# Patient Record
Sex: Female | Born: 1980 | Race: White | Hispanic: No | Marital: Single | Smoking: Current every day smoker
Health system: Southern US, Community
[De-identification: ages and names within clinical notes are randomized; demographics above are authoritative.]

## PROBLEM LIST (undated history)

## (undated) VITALS — BP 116/81 | HR 113 | Temp 97.3°F | Resp 17

## (undated) DIAGNOSIS — R51 Headache: Secondary | ICD-10-CM

## (undated) DIAGNOSIS — F329 Major depressive disorder, single episode, unspecified: Secondary | ICD-10-CM

## (undated) DIAGNOSIS — K219 Gastro-esophageal reflux disease without esophagitis: Secondary | ICD-10-CM

## (undated) DIAGNOSIS — K589 Irritable bowel syndrome without diarrhea: Secondary | ICD-10-CM

## (undated) DIAGNOSIS — M419 Scoliosis, unspecified: Secondary | ICD-10-CM

## (undated) DIAGNOSIS — R87619 Unspecified abnormal cytological findings in specimens from cervix uteri: Secondary | ICD-10-CM

## (undated) DIAGNOSIS — R160 Hepatomegaly, not elsewhere classified: Secondary | ICD-10-CM

## (undated) DIAGNOSIS — F419 Anxiety disorder, unspecified: Secondary | ICD-10-CM

## (undated) DIAGNOSIS — F32A Depression, unspecified: Secondary | ICD-10-CM

## (undated) DIAGNOSIS — IMO0002 Reserved for concepts with insufficient information to code with codable children: Secondary | ICD-10-CM

## (undated) HISTORY — PX: DILATION AND CURETTAGE OF UTERUS: SHX78

## (undated) HISTORY — PX: ENDOMETRIAL ABLATION: SHX621

## (undated) HISTORY — PX: TUBAL LIGATION: SHX77

---

## 1998-01-28 ENCOUNTER — Emergency Department (HOSPITAL_COMMUNITY): Admission: EM | Admit: 1998-01-28 | Discharge: 1998-01-28 | Payer: Self-pay | Admitting: Emergency Medicine

## 1998-11-12 ENCOUNTER — Emergency Department (HOSPITAL_COMMUNITY): Admission: EM | Admit: 1998-11-12 | Discharge: 1998-11-12 | Payer: Self-pay | Admitting: Internal Medicine

## 1998-12-30 ENCOUNTER — Inpatient Hospital Stay (HOSPITAL_COMMUNITY): Admission: EM | Admit: 1998-12-30 | Discharge: 1999-01-01 | Payer: Self-pay | Admitting: Emergency Medicine

## 1999-05-18 ENCOUNTER — Inpatient Hospital Stay (HOSPITAL_COMMUNITY): Admission: AD | Admit: 1999-05-18 | Discharge: 1999-05-18 | Payer: Self-pay | Admitting: Obstetrics

## 1999-06-04 ENCOUNTER — Encounter: Admission: RE | Admit: 1999-06-04 | Discharge: 1999-06-04 | Payer: Self-pay | Admitting: Family Medicine

## 1999-06-18 ENCOUNTER — Other Ambulatory Visit: Admission: RE | Admit: 1999-06-18 | Discharge: 1999-06-18 | Payer: Self-pay | Admitting: Family Medicine

## 1999-06-18 ENCOUNTER — Encounter: Admission: RE | Admit: 1999-06-18 | Discharge: 1999-06-18 | Payer: Self-pay | Admitting: Family Medicine

## 1999-06-20 ENCOUNTER — Ambulatory Visit (HOSPITAL_COMMUNITY): Admission: RE | Admit: 1999-06-20 | Discharge: 1999-06-20 | Payer: Self-pay | Admitting: Internal Medicine

## 1999-07-10 ENCOUNTER — Encounter: Admission: RE | Admit: 1999-07-10 | Discharge: 1999-07-10 | Payer: Self-pay | Admitting: Family Medicine

## 1999-07-17 ENCOUNTER — Encounter: Admission: RE | Admit: 1999-07-17 | Discharge: 1999-07-17 | Payer: Self-pay | Admitting: Family Medicine

## 1999-07-25 ENCOUNTER — Encounter: Admission: RE | Admit: 1999-07-25 | Discharge: 1999-07-25 | Payer: Self-pay | Admitting: Family Medicine

## 1999-08-16 ENCOUNTER — Ambulatory Visit (HOSPITAL_COMMUNITY): Admission: RE | Admit: 1999-08-16 | Discharge: 1999-08-16 | Payer: Self-pay | Admitting: *Deleted

## 1999-09-06 ENCOUNTER — Encounter: Admission: RE | Admit: 1999-09-06 | Discharge: 1999-09-06 | Payer: Self-pay | Admitting: Family Medicine

## 1999-09-13 ENCOUNTER — Encounter: Admission: RE | Admit: 1999-09-13 | Discharge: 1999-09-13 | Payer: Self-pay | Admitting: Family Medicine

## 1999-09-20 ENCOUNTER — Encounter: Admission: RE | Admit: 1999-09-20 | Discharge: 1999-09-20 | Payer: Self-pay | Admitting: Family Medicine

## 1999-09-23 ENCOUNTER — Encounter: Admission: RE | Admit: 1999-09-23 | Discharge: 1999-09-23 | Payer: Self-pay | Admitting: Family Medicine

## 1999-10-02 ENCOUNTER — Encounter: Admission: RE | Admit: 1999-10-02 | Discharge: 1999-10-02 | Payer: Self-pay | Admitting: Family Medicine

## 1999-10-15 ENCOUNTER — Inpatient Hospital Stay (HOSPITAL_COMMUNITY): Admission: AD | Admit: 1999-10-15 | Discharge: 1999-10-17 | Payer: Self-pay | Admitting: *Deleted

## 1999-10-15 ENCOUNTER — Encounter: Payer: Self-pay | Admitting: *Deleted

## 1999-10-20 ENCOUNTER — Emergency Department (HOSPITAL_COMMUNITY): Admission: EM | Admit: 1999-10-20 | Discharge: 1999-10-20 | Payer: Self-pay | Admitting: Emergency Medicine

## 1999-10-25 ENCOUNTER — Encounter (HOSPITAL_COMMUNITY): Admission: RE | Admit: 1999-10-25 | Discharge: 1999-11-04 | Payer: Self-pay | Admitting: *Deleted

## 1999-10-29 ENCOUNTER — Encounter: Admission: RE | Admit: 1999-10-29 | Discharge: 1999-10-29 | Payer: Self-pay | Admitting: Family Medicine

## 1999-10-29 ENCOUNTER — Other Ambulatory Visit: Admission: RE | Admit: 1999-10-29 | Discharge: 1999-10-29 | Payer: Self-pay | Admitting: Family Medicine

## 1999-11-02 ENCOUNTER — Inpatient Hospital Stay (HOSPITAL_COMMUNITY): Admission: AD | Admit: 1999-11-02 | Discharge: 1999-11-04 | Payer: Self-pay | Admitting: Obstetrics

## 1999-12-01 ENCOUNTER — Inpatient Hospital Stay (HOSPITAL_COMMUNITY): Admission: AD | Admit: 1999-12-01 | Discharge: 1999-12-01 | Payer: Self-pay | Admitting: Obstetrics & Gynecology

## 1999-12-02 ENCOUNTER — Inpatient Hospital Stay (HOSPITAL_COMMUNITY): Admission: AD | Admit: 1999-12-02 | Discharge: 1999-12-04 | Payer: Self-pay | Admitting: Obstetrics

## 1999-12-02 ENCOUNTER — Encounter (INDEPENDENT_AMBULATORY_CARE_PROVIDER_SITE_OTHER): Payer: Self-pay | Admitting: Specialist

## 1999-12-02 ENCOUNTER — Ambulatory Visit (HOSPITAL_COMMUNITY): Admission: EM | Admit: 1999-12-02 | Discharge: 1999-12-02 | Payer: Self-pay | Admitting: Obstetrics & Gynecology

## 1999-12-12 ENCOUNTER — Encounter: Admission: RE | Admit: 1999-12-12 | Discharge: 1999-12-12 | Payer: Self-pay | Admitting: Obstetrics

## 2000-02-10 ENCOUNTER — Emergency Department (HOSPITAL_COMMUNITY): Admission: EM | Admit: 2000-02-10 | Discharge: 2000-02-10 | Payer: Self-pay | Admitting: Emergency Medicine

## 2000-02-13 ENCOUNTER — Encounter: Admission: RE | Admit: 2000-02-13 | Discharge: 2000-02-13 | Payer: Self-pay | Admitting: Family Medicine

## 2000-02-26 ENCOUNTER — Emergency Department (HOSPITAL_COMMUNITY): Admission: EM | Admit: 2000-02-26 | Discharge: 2000-02-26 | Payer: Self-pay | Admitting: Emergency Medicine

## 2000-02-26 ENCOUNTER — Encounter: Payer: Self-pay | Admitting: Emergency Medicine

## 2000-03-18 ENCOUNTER — Encounter: Admission: RE | Admit: 2000-03-18 | Discharge: 2000-03-18 | Payer: Self-pay | Admitting: Family Medicine

## 2000-03-31 ENCOUNTER — Other Ambulatory Visit: Admission: RE | Admit: 2000-03-31 | Discharge: 2000-03-31 | Payer: Self-pay | Admitting: Obstetrics

## 2000-03-31 ENCOUNTER — Other Ambulatory Visit: Admission: RE | Admit: 2000-03-31 | Discharge: 2000-03-31 | Payer: Self-pay | Admitting: Family Medicine

## 2000-04-16 ENCOUNTER — Encounter: Admission: RE | Admit: 2000-04-16 | Discharge: 2000-04-16 | Payer: Self-pay | Admitting: Family Medicine

## 2000-09-02 ENCOUNTER — Emergency Department (HOSPITAL_COMMUNITY): Admission: EM | Admit: 2000-09-02 | Discharge: 2000-09-02 | Payer: Self-pay | Admitting: Emergency Medicine

## 2001-03-19 ENCOUNTER — Inpatient Hospital Stay (HOSPITAL_COMMUNITY): Admission: AD | Admit: 2001-03-19 | Discharge: 2001-03-19 | Payer: Self-pay | Admitting: Obstetrics & Gynecology

## 2002-02-04 ENCOUNTER — Encounter: Admission: RE | Admit: 2002-02-04 | Discharge: 2002-02-04 | Payer: Self-pay | Admitting: Family Medicine

## 2002-02-04 ENCOUNTER — Observation Stay (HOSPITAL_COMMUNITY): Admission: AD | Admit: 2002-02-04 | Discharge: 2002-02-05 | Payer: Self-pay | Admitting: Obstetrics and Gynecology

## 2002-02-08 ENCOUNTER — Encounter (HOSPITAL_COMMUNITY): Admission: RE | Admit: 2002-02-08 | Discharge: 2002-03-10 | Payer: Self-pay | Admitting: *Deleted

## 2002-02-18 ENCOUNTER — Inpatient Hospital Stay (HOSPITAL_COMMUNITY): Admission: AD | Admit: 2002-02-18 | Discharge: 2002-02-18 | Payer: Self-pay | Admitting: *Deleted

## 2002-02-19 ENCOUNTER — Inpatient Hospital Stay (HOSPITAL_COMMUNITY): Admission: AD | Admit: 2002-02-19 | Discharge: 2002-02-19 | Payer: Self-pay | Admitting: *Deleted

## 2002-03-07 ENCOUNTER — Encounter: Admission: RE | Admit: 2002-03-07 | Discharge: 2002-03-07 | Payer: Self-pay | Admitting: Family Medicine

## 2002-03-17 ENCOUNTER — Encounter: Admission: RE | Admit: 2002-03-17 | Discharge: 2002-03-17 | Payer: Self-pay | Admitting: Family Medicine

## 2002-03-25 ENCOUNTER — Encounter: Admission: RE | Admit: 2002-03-25 | Discharge: 2002-03-25 | Payer: Self-pay | Admitting: Family Medicine

## 2002-04-04 ENCOUNTER — Inpatient Hospital Stay (HOSPITAL_COMMUNITY): Admission: AD | Admit: 2002-04-04 | Discharge: 2002-04-06 | Payer: Self-pay | Admitting: Obstetrics and Gynecology

## 2002-05-19 ENCOUNTER — Encounter: Admission: RE | Admit: 2002-05-19 | Discharge: 2002-05-19 | Payer: Self-pay | Admitting: Family Medicine

## 2002-05-19 ENCOUNTER — Other Ambulatory Visit: Admission: RE | Admit: 2002-05-19 | Discharge: 2002-05-19 | Payer: Self-pay | Admitting: Family Medicine

## 2003-06-26 ENCOUNTER — Inpatient Hospital Stay (HOSPITAL_COMMUNITY): Admission: AD | Admit: 2003-06-26 | Discharge: 2003-06-26 | Payer: Self-pay | Admitting: Obstetrics & Gynecology

## 2003-09-14 ENCOUNTER — Emergency Department (HOSPITAL_COMMUNITY): Admission: EM | Admit: 2003-09-14 | Discharge: 2003-09-14 | Payer: Self-pay | Admitting: Emergency Medicine

## 2003-09-21 ENCOUNTER — Emergency Department (HOSPITAL_COMMUNITY): Admission: EM | Admit: 2003-09-21 | Discharge: 2003-09-21 | Payer: Self-pay | Admitting: Emergency Medicine

## 2004-05-03 ENCOUNTER — Inpatient Hospital Stay (HOSPITAL_COMMUNITY): Admission: AD | Admit: 2004-05-03 | Discharge: 2004-05-04 | Payer: Self-pay | Admitting: *Deleted

## 2004-08-11 ENCOUNTER — Inpatient Hospital Stay (HOSPITAL_COMMUNITY): Admission: AD | Admit: 2004-08-11 | Discharge: 2004-08-12 | Payer: Self-pay | Admitting: Obstetrics & Gynecology

## 2004-08-13 ENCOUNTER — Inpatient Hospital Stay (HOSPITAL_COMMUNITY): Admission: AD | Admit: 2004-08-13 | Discharge: 2004-08-13 | Payer: Self-pay | Admitting: *Deleted

## 2004-09-16 ENCOUNTER — Emergency Department (HOSPITAL_COMMUNITY): Admission: EM | Admit: 2004-09-16 | Discharge: 2004-09-16 | Payer: Self-pay | Admitting: Emergency Medicine

## 2004-11-10 ENCOUNTER — Inpatient Hospital Stay (HOSPITAL_COMMUNITY): Admission: AD | Admit: 2004-11-10 | Discharge: 2004-11-10 | Payer: Self-pay | Admitting: Obstetrics

## 2005-07-02 ENCOUNTER — Inpatient Hospital Stay (HOSPITAL_COMMUNITY): Admission: AD | Admit: 2005-07-02 | Discharge: 2005-07-02 | Payer: Self-pay | Admitting: Obstetrics and Gynecology

## 2005-09-08 ENCOUNTER — Encounter (INDEPENDENT_AMBULATORY_CARE_PROVIDER_SITE_OTHER): Payer: Self-pay | Admitting: *Deleted

## 2005-09-08 LAB — CONVERTED CEMR LAB

## 2005-09-18 ENCOUNTER — Ambulatory Visit: Payer: Self-pay | Admitting: Family Medicine

## 2005-09-25 ENCOUNTER — Encounter (INDEPENDENT_AMBULATORY_CARE_PROVIDER_SITE_OTHER): Payer: Self-pay | Admitting: *Deleted

## 2005-09-25 ENCOUNTER — Other Ambulatory Visit: Admission: RE | Admit: 2005-09-25 | Discharge: 2005-09-25 | Payer: Self-pay | Admitting: Family Medicine

## 2005-09-25 ENCOUNTER — Ambulatory Visit: Payer: Self-pay | Admitting: Family Medicine

## 2005-10-02 ENCOUNTER — Ambulatory Visit (HOSPITAL_COMMUNITY): Admission: RE | Admit: 2005-10-02 | Discharge: 2005-10-02 | Payer: Self-pay | Admitting: Obstetrics and Gynecology

## 2005-11-04 ENCOUNTER — Ambulatory Visit: Payer: Self-pay | Admitting: Family Medicine

## 2005-12-04 ENCOUNTER — Ambulatory Visit: Payer: Self-pay | Admitting: Family Medicine

## 2006-01-01 ENCOUNTER — Ambulatory Visit: Payer: Self-pay | Admitting: Family Medicine

## 2006-01-01 ENCOUNTER — Ambulatory Visit: Payer: Self-pay | Admitting: Certified Nurse Midwife

## 2006-01-01 ENCOUNTER — Inpatient Hospital Stay (HOSPITAL_COMMUNITY): Admission: AD | Admit: 2006-01-01 | Discharge: 2006-01-02 | Payer: Self-pay | Admitting: *Deleted

## 2006-01-14 ENCOUNTER — Ambulatory Visit: Payer: Self-pay | Admitting: Sports Medicine

## 2006-02-11 ENCOUNTER — Ambulatory Visit: Payer: Self-pay | Admitting: Family Medicine

## 2006-02-17 ENCOUNTER — Inpatient Hospital Stay (HOSPITAL_COMMUNITY): Admission: AD | Admit: 2006-02-17 | Discharge: 2006-02-19 | Payer: Self-pay | Admitting: Obstetrics and Gynecology

## 2006-02-17 ENCOUNTER — Ambulatory Visit: Payer: Self-pay | Admitting: Obstetrics and Gynecology

## 2006-03-31 ENCOUNTER — Ambulatory Visit: Payer: Self-pay | Admitting: Family Medicine

## 2006-05-07 ENCOUNTER — Ambulatory Visit: Payer: Self-pay | Admitting: Family Medicine

## 2006-05-14 ENCOUNTER — Ambulatory Visit: Payer: Self-pay | Admitting: Family Medicine

## 2006-09-08 HISTORY — PX: BREAST ENHANCEMENT SURGERY: SHX7

## 2006-11-04 ENCOUNTER — Ambulatory Visit: Payer: Self-pay | Admitting: Family Medicine

## 2006-11-04 ENCOUNTER — Inpatient Hospital Stay (HOSPITAL_COMMUNITY): Admission: RE | Admit: 2006-11-04 | Discharge: 2006-11-04 | Payer: Self-pay | Admitting: Family Medicine

## 2006-11-04 ENCOUNTER — Encounter (INDEPENDENT_AMBULATORY_CARE_PROVIDER_SITE_OTHER): Payer: Self-pay | Admitting: Family Medicine

## 2006-11-04 LAB — CONVERTED CEMR LAB
Chlamydia, DNA Probe: NEGATIVE
GC Probe Amp, Genital: NEGATIVE
hCG, Beta Chain, Quant, S: 214.5 milliintl units/mL

## 2006-11-05 DIAGNOSIS — F329 Major depressive disorder, single episode, unspecified: Secondary | ICD-10-CM

## 2006-11-05 DIAGNOSIS — G2589 Other specified extrapyramidal and movement disorders: Secondary | ICD-10-CM

## 2006-11-05 DIAGNOSIS — R8789 Other abnormal findings in specimens from female genital organs: Secondary | ICD-10-CM

## 2006-11-06 ENCOUNTER — Encounter (INDEPENDENT_AMBULATORY_CARE_PROVIDER_SITE_OTHER): Payer: Self-pay | Admitting: *Deleted

## 2006-11-06 ENCOUNTER — Inpatient Hospital Stay (HOSPITAL_COMMUNITY): Admission: AD | Admit: 2006-11-06 | Discharge: 2006-11-06 | Payer: Self-pay | Admitting: Obstetrics and Gynecology

## 2007-01-22 ENCOUNTER — Telehealth: Payer: Self-pay | Admitting: Family Medicine

## 2007-01-25 ENCOUNTER — Emergency Department (HOSPITAL_COMMUNITY): Admission: EM | Admit: 2007-01-25 | Discharge: 2007-01-25 | Payer: Self-pay | Admitting: Family Medicine

## 2007-01-25 ENCOUNTER — Telehealth: Payer: Self-pay | Admitting: *Deleted

## 2007-01-25 ENCOUNTER — Telehealth (INDEPENDENT_AMBULATORY_CARE_PROVIDER_SITE_OTHER): Payer: Self-pay | Admitting: *Deleted

## 2007-01-26 ENCOUNTER — Encounter: Payer: Self-pay | Admitting: Family Medicine

## 2007-01-26 ENCOUNTER — Ambulatory Visit: Payer: Self-pay | Admitting: Family Medicine

## 2007-01-26 DIAGNOSIS — N912 Amenorrhea, unspecified: Secondary | ICD-10-CM

## 2007-01-27 ENCOUNTER — Telehealth: Payer: Self-pay | Admitting: *Deleted

## 2007-02-08 ENCOUNTER — Ambulatory Visit: Payer: Self-pay | Admitting: Family Medicine

## 2007-02-08 LAB — CONVERTED CEMR LAB: Beta hcg, urine, semiquantitative: NEGATIVE

## 2007-02-24 ENCOUNTER — Ambulatory Visit: Payer: Self-pay | Admitting: Family Medicine

## 2007-02-24 LAB — CONVERTED CEMR LAB: Beta hcg, urine, semiquantitative: NEGATIVE

## 2007-06-01 ENCOUNTER — Ambulatory Visit: Payer: Self-pay | Admitting: Family Medicine

## 2007-06-01 LAB — CONVERTED CEMR LAB: Beta hcg, urine, semiquantitative: NEGATIVE

## 2007-06-21 ENCOUNTER — Emergency Department (HOSPITAL_COMMUNITY): Admission: EM | Admit: 2007-06-21 | Discharge: 2007-06-21 | Payer: Self-pay | Admitting: Family Medicine

## 2007-09-15 ENCOUNTER — Telehealth: Payer: Self-pay | Admitting: *Deleted

## 2007-09-17 ENCOUNTER — Telehealth: Payer: Self-pay | Admitting: *Deleted

## 2007-09-20 ENCOUNTER — Encounter: Payer: Self-pay | Admitting: Family Medicine

## 2007-09-20 ENCOUNTER — Ambulatory Visit: Payer: Self-pay | Admitting: Sports Medicine

## 2007-09-20 ENCOUNTER — Telehealth: Payer: Self-pay | Admitting: *Deleted

## 2007-10-22 ENCOUNTER — Encounter: Payer: Self-pay | Admitting: *Deleted

## 2007-11-29 ENCOUNTER — Telehealth: Payer: Self-pay | Admitting: *Deleted

## 2007-11-29 ENCOUNTER — Inpatient Hospital Stay (HOSPITAL_COMMUNITY): Admission: AD | Admit: 2007-11-29 | Discharge: 2007-11-29 | Payer: Self-pay | Admitting: Obstetrics and Gynecology

## 2007-11-30 ENCOUNTER — Telehealth (INDEPENDENT_AMBULATORY_CARE_PROVIDER_SITE_OTHER): Payer: Self-pay | Admitting: *Deleted

## 2007-12-09 ENCOUNTER — Encounter (INDEPENDENT_AMBULATORY_CARE_PROVIDER_SITE_OTHER): Payer: Self-pay | Admitting: *Deleted

## 2007-12-09 ENCOUNTER — Ambulatory Visit: Payer: Self-pay | Admitting: Family Medicine

## 2007-12-09 LAB — CONVERTED CEMR LAB
Chlamydia, DNA Probe: NEGATIVE
GC Probe Amp, Genital: NEGATIVE
Whiff Test: NEGATIVE

## 2007-12-10 ENCOUNTER — Encounter (INDEPENDENT_AMBULATORY_CARE_PROVIDER_SITE_OTHER): Payer: Self-pay | Admitting: *Deleted

## 2007-12-10 ENCOUNTER — Telehealth: Payer: Self-pay | Admitting: Psychology

## 2007-12-10 ENCOUNTER — Telehealth (INDEPENDENT_AMBULATORY_CARE_PROVIDER_SITE_OTHER): Payer: Self-pay | Admitting: *Deleted

## 2007-12-10 LAB — CONVERTED CEMR LAB: Pap Smear: NORMAL

## 2007-12-14 ENCOUNTER — Ambulatory Visit: Payer: Self-pay | Admitting: Family Medicine

## 2007-12-14 ENCOUNTER — Ambulatory Visit (HOSPITAL_COMMUNITY): Admission: RE | Admit: 2007-12-14 | Discharge: 2007-12-14 | Payer: Self-pay | Admitting: Family Medicine

## 2007-12-14 ENCOUNTER — Encounter: Payer: Self-pay | Admitting: Family Medicine

## 2007-12-14 LAB — CONVERTED CEMR LAB
HCT: 40.2 % (ref 36.0–46.0)
Hemoglobin: 13.3 g/dL (ref 12.0–15.0)
MCHC: 33.1 g/dL (ref 30.0–36.0)
MCV: 95.3 fL (ref 78.0–100.0)
Platelets: 229 10*3/uL (ref 150–400)
RBC: 4.22 M/uL (ref 3.87–5.11)
RDW: 12.6 % (ref 11.5–15.5)
TSH: 1.029 microintl units/mL (ref 0.350–5.50)
WBC: 8.8 10*3/uL (ref 4.0–10.5)

## 2007-12-18 ENCOUNTER — Encounter (INDEPENDENT_AMBULATORY_CARE_PROVIDER_SITE_OTHER): Payer: Self-pay | Admitting: *Deleted

## 2007-12-22 ENCOUNTER — Encounter: Payer: Self-pay | Admitting: Family Medicine

## 2007-12-23 ENCOUNTER — Telehealth: Payer: Self-pay | Admitting: Psychology

## 2009-05-26 ENCOUNTER — Encounter (INDEPENDENT_AMBULATORY_CARE_PROVIDER_SITE_OTHER): Payer: Self-pay | Admitting: *Deleted

## 2009-05-26 DIAGNOSIS — F17200 Nicotine dependence, unspecified, uncomplicated: Secondary | ICD-10-CM

## 2010-08-29 ENCOUNTER — Encounter: Payer: Self-pay | Admitting: Family Medicine

## 2010-09-08 HISTORY — PX: LAPAROSCOPIC OVARIAN CYSTECTOMY: SUR786

## 2010-10-10 NOTE — Miscellaneous (Signed)
  Clinical Lists Changes  Problems: Removed problem of UNSPECIFIED TACHYCARDIA (ICD-785.0) Removed problem of FAMILY PLANNING (ICD-V25.09) Removed problem of SCREENING FOR MALIGNANT NEOPLASM OF THE CERVIX (ICD-V76.2) Removed problem of VAGINAL DISCHARGE (ICD-623.5) Removed problem of CONTRACEPTIVE MANAGEMENT (ICD-V25.9) Removed problem of CANDIDIASIS, VULVA/VAGINA (ICD-112.1) Removed problem of BACTERIAL VAGINITIS (ICD-616.10)

## 2011-01-24 NOTE — Op Note (Signed)
[facility]  Patient:    Mary Hogan, Mary Hogan                   MRN: [id number] Proc. Date: 12/03/99 Adm. Date:  [id number] Disc. Date: [id number] Attending:  Antionette Char                           Operative Report  PREOPERATIVE DIAGNOSIS:       Retained products of conception.  POSTOPERATIVE DIAGNOSIS:      Retained products of conception.  OPERATION:                    Dilatation and evacuation of the uterus.  SURGEON:                      Conni Elliot, M.D.  ASSISTANT:  ANESTHESIA:                   MAC.  FINDINGS:                     The uterus was anterior to midplane, approximately 8 to 10 weeks size and firm.  However, the cervix was continued to be dilated and  there was a large amount of blood in the vaginal vault.  The cervix was dilated  already to easily accept a #32 dilator and a #10 suction curet was utilized followed by a sharp curettage.  ESTIMATED BLOOD LOSS:  DESCRIPTION OF PROCEDURE:     After placing the patient under MAC anesthesia with the patient in the supine dorsal lithotomy position, the perineum and vagina were prepped with Betadine solution.  The bladder was emptied.  The weighted speculum was placed in the posterior vagina.  Anterior cervix was grasped with a single tooth tenaculum.  The cervix was identified to be dilated with dilators up to #32. A suction curet #10 was utilized.  Clearly a piece of fragmented material was seen to be produced.  This was followed by sharp curettage.  Estimated blood loss during the procedure itself was less than 50 cc.  Sponge, needle, and instrument counts were correct. DD:  12/03/99 TD:  12/03/99 Job: [id number]
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
## 2011-03-05 DIAGNOSIS — G8929 Other chronic pain: Secondary | ICD-10-CM | POA: Insufficient documentation

## 2011-03-05 DIAGNOSIS — G43909 Migraine, unspecified, not intractable, without status migrainosus: Secondary | ICD-10-CM | POA: Insufficient documentation

## 2011-06-19 LAB — POCT RAPID STREP A: Streptococcus, Group A Screen (Direct): NEGATIVE

## 2011-08-27 DIAGNOSIS — R1013 Epigastric pain: Secondary | ICD-10-CM | POA: Insufficient documentation

## 2011-10-13 DIAGNOSIS — K589 Irritable bowel syndrome without diarrhea: Secondary | ICD-10-CM | POA: Insufficient documentation

## 2011-10-28 ENCOUNTER — Inpatient Hospital Stay (HOSPITAL_COMMUNITY)
Admission: AD | Admit: 2011-10-28 | Discharge: 2011-10-29 | Disposition: A | Discharge status: Home / Self Care | Payer: Medicaid Other | Source: Ambulatory Visit | Attending: Obstetrics & Gynecology | Admitting: Obstetrics & Gynecology

## 2011-10-28 ENCOUNTER — Encounter (HOSPITAL_COMMUNITY): Payer: Self-pay | Admitting: *Deleted

## 2011-10-28 DIAGNOSIS — R300 Dysuria: Secondary | ICD-10-CM | POA: Insufficient documentation

## 2011-10-28 DIAGNOSIS — N390 Urinary tract infection, site not specified: Secondary | ICD-10-CM

## 2011-10-28 DIAGNOSIS — N644 Mastodynia: Secondary | ICD-10-CM | POA: Insufficient documentation

## 2011-10-28 DIAGNOSIS — R071 Chest pain on breathing: Secondary | ICD-10-CM

## 2011-10-28 DIAGNOSIS — R0789 Other chest pain: Secondary | ICD-10-CM

## 2011-10-28 HISTORY — DX: Major depressive disorder, single episode, unspecified: F32.9

## 2011-10-28 HISTORY — DX: Unspecified abnormal cytological findings in specimens from cervix uteri: R87.619

## 2011-10-28 HISTORY — DX: Anxiety disorder, unspecified: F41.9

## 2011-10-28 HISTORY — DX: Headache: R51

## 2011-10-28 HISTORY — DX: Irritable bowel syndrome, unspecified: K58.9

## 2011-10-28 HISTORY — DX: Reserved for concepts with insufficient information to code with codable children: IMO0002

## 2011-10-28 HISTORY — DX: Depression, unspecified: F32.A

## 2011-10-28 HISTORY — DX: Scoliosis, unspecified: M41.9

## 2011-10-28 LAB — URINALYSIS, ROUTINE W REFLEX MICROSCOPIC
Bilirubin Urine: NEGATIVE
Glucose, UA: 250 mg/dL — AB
Hgb urine dipstick: NEGATIVE
Ketones, ur: 15 mg/dL — AB
Nitrite: POSITIVE — AB
Protein, ur: 100 mg/dL — AB
Specific Gravity, Urine: 1.025 (ref 1.005–1.030)
pH: 5 (ref 5.0–8.0)

## 2011-10-28 LAB — URINE MICROSCOPIC-ADD ON

## 2011-10-28 LAB — POCT PREGNANCY, URINE: Preg Test, Ur: NEGATIVE

## 2011-10-28 MED ORDER — PHENAZOPYRIDINE HCL 100 MG PO TABS
200.0000 mg | ORAL_TABLET | Freq: Once | ORAL | Status: AC
  Administered 2011-10-28: 200 mg via ORAL
  Filled 2011-10-28: qty 1

## 2011-10-28 MED ORDER — KETOROLAC TROMETHAMINE 60 MG/2ML IM SOLN
60.0000 mg | Freq: Once | INTRAMUSCULAR | Status: AC
  Administered 2011-10-28: 60 mg via INTRAMUSCULAR
  Filled 2011-10-28: qty 2

## 2011-10-28 MED ORDER — CYCLOBENZAPRINE HCL 10 MG PO TABS
10.0000 mg | ORAL_TABLET | Freq: Once | ORAL | Status: AC
  Administered 2011-10-28: 10 mg via ORAL
  Filled 2011-10-28: qty 1

## 2011-10-28 NOTE — ED Notes (Signed)
Georges Mouse at bedside, breast exam done. Pt reports cleaning ceiling fan on Sunday. Both breast sore on lateral aspects.

## 2011-10-28 NOTE — Progress Notes (Signed)
Pt reports pain with urination today, also reports left breast "hurts really bad when anything touches it" states it is not red or swollen. Temp 101 this am at home

## 2011-10-28 NOTE — ED Provider Notes (Signed)
History     Chief Complaint  Patient presents with  . Breast Pain   HPI 31 y.o. [id number] with pain with urination since this morning, leaking urine, left flank pain x 1 day, temp 101.0 at home today, took tylenol several hours ago. + nausea. Also c/o left breast pain since Sunday night, outer quadrants, tender to touch, now hurting some on right breast too. No redness or nipple discharge that she has noticed. H/O breast implants. Pt states the pain started after cleaning her ceiling fan on Sunday.     Past Medical History  Diagnosis Date  . Headache   . Anxiety   . Depression   . Abnormal Pap smear   . IBS (irritable bowel syndrome)     Past Surgical History  Procedure Date  . Dilation and curettage of uterus   . Laparoscopic ovarian cystectomy 2012  . Breast enhancement surgery 2008    Family History  Problem Relation Age of Onset  . Anesthesia problems Neg Hx   . Hypotension Neg Hx   . Malignant hyperthermia Neg Hx   . Pseudochol deficiency Neg Hx     History  Substance Use Topics  . Smoking status: Current Everyday Smoker -- 1.0 packs/day  . Smokeless tobacco: Never Used  . Alcohol Use: No    Allergies:  Allergies  Allergen Reactions  . Amoxicillin Hives    Prescriptions prior to admission  Medication Sig Dispense Refill  . clonazePAM (KLONOPIN) 1 MG tablet Take 1 mg by mouth 2 (two) times daily as needed. For anixety      . HYDROcodone-acetaminophen (VICODIN) 5-500 MG per tablet Take 1 tablet by mouth every 4 (four) hours as needed. For pain      . SUMAtriptan (IMITREX) 50 MG tablet Take 50 mg by mouth every 2 (two) hours as needed. For migraine      . temazepam (RESTORIL) 15 MG capsule Take 15 mg by mouth at bedtime as needed. For sleep        Review of Systems  Constitutional: Negative.   Respiratory: Negative.   Cardiovascular: Negative.   Gastrointestinal: Positive for nausea. Negative for vomiting, abdominal pain, diarrhea and constipation.    Genitourinary: Positive for dysuria and flank pain. Negative for urgency, frequency and hematuria.       Negative for vaginal bleeding, vaginal discharge  Musculoskeletal: Negative.   Neurological: Negative.   Psychiatric/Behavioral: Negative.    Physical Exam   Blood pressure 125/82, pulse 117, temperature 98.5 F (36.9 C), resp. rate 22, height 5\' 5"  (1.651 m), weight 137 lb (62.143 kg), SpO2 100.00%.  Physical Exam  Nursing note and vitals reviewed. Constitutional: She is oriented to person, place, and time. She appears well-developed and well-nourished. No distress.  Cardiovascular: Normal rate.   Respiratory: Effort normal. Right breast exhibits tenderness. Right breast exhibits no mass, no nipple discharge and no skin change. Left breast exhibits tenderness. Left breast exhibits no mass, no nipple discharge and no skin change. Breasts are symmetrical.       Generalized tenderness over both breasts, worse in outer quadrants, worse on left, no erythema, no masses  GI: Soft. There is no tenderness.  Musculoskeletal: Normal range of motion.  Neurological: She is alert and oriented to person, place, and time.  Skin: Skin is warm and dry.  Psychiatric: She has a normal mood and affect.    MAU Course  Procedures  Results for orders placed during the hospital encounter of 10/28/11 (from the past 24 hour(s))  URINALYSIS, ROUTINE W REFLEX MICROSCOPIC     Status: Abnormal   Collection Time   10/28/11  9:26 PM      Component Value Range   Color, Urine ORANGE (*) YELLOW    APPearance CLEAR  CLEAR    Specific Gravity, Urine 1.025  1.005 - 1.030    pH 5.0  5.0 - 8.0    Glucose, UA 250 (*) NEGATIVE (mg/dL)   Hgb urine dipstick NEGATIVE  NEGATIVE    Bilirubin Urine NEGATIVE  NEGATIVE    Ketones, ur 15 (*) NEGATIVE (mg/dL)   Protein, ur 161 (*) NEGATIVE (mg/dL)   Urobilinogen, UA >0.9 (*) 0.0 - 1.0 (mg/dL)   Nitrite POSITIVE (*) NEGATIVE    Leukocytes, UA SMALL (*) NEGATIVE   URINE  MICROSCOPIC-ADD ON     Status: Abnormal   Collection Time   10/28/11  9:26 PM      Component Value Range   Squamous Epithelial / LPF FEW (*) RARE    WBC, UA TOO NUMEROUS TO COUNT  <3 (WBC/hpf)   Bacteria, UA MANY (*) RARE    Urine-Other MUCOUS PRESENT    POCT PREGNANCY, URINE     Status: Normal   Collection Time   10/28/11 10:10 PM      Component Value Range   Preg Test, Ur NEGATIVE  NEGATIVE    Breast pain improved with Toradol and Flexeril  Assessment and Plan  UTI - rx Bactrim, Pyridium, f/u if symptoms worsen Breast pain - likely musculoskeletal in origin, no signs of breast infection. Rx Motrin and Flexeril, f/u with breast doctor if no improvement by next week   Cristle Jared 10/28/2011, 10:18 PM

## 2011-10-29 MED ORDER — PHENAZOPYRIDINE HCL 200 MG PO TABS: 200.0000 mg | ORAL_TABLET | Freq: Three times a day (TID) | ORAL | Status: AC | PRN

## 2011-10-29 MED ORDER — CYCLOBENZAPRINE HCL 10 MG PO TABS: 10.0000 mg | ORAL_TABLET | Freq: Three times a day (TID) | ORAL | Status: AC | PRN

## 2011-10-29 MED ORDER — SULFAMETHOXAZOLE-TMP DS 800-160 MG PO TABS: 1.0000 | ORAL_TABLET | Freq: Two times a day (BID) | ORAL | Status: AC

## 2011-10-29 MED ORDER — IBUPROFEN 800 MG PO TABS: 800.0000 mg | ORAL_TABLET | Freq: Three times a day (TID) | ORAL | Status: AC | PRN

## 2011-10-30 ENCOUNTER — Telehealth (HOSPITAL_COMMUNITY): Payer: Self-pay

## 2011-12-05 ENCOUNTER — Inpatient Hospital Stay (HOSPITAL_COMMUNITY)
Admission: AD | Admit: 2011-12-05 | Discharge: 2011-12-09 | DRG: 885 | Disposition: A | Discharge status: Home / Self Care | Payer: Medicaid Other | Attending: Psychiatry | Admitting: Psychiatry

## 2011-12-05 ENCOUNTER — Telehealth (HOSPITAL_COMMUNITY): Payer: Self-pay | Admitting: Licensed Clinical Social Worker

## 2011-12-05 DIAGNOSIS — K589 Irritable bowel syndrome without diarrhea: Secondary | ICD-10-CM | POA: Diagnosis present

## 2011-12-05 DIAGNOSIS — F17200 Nicotine dependence, unspecified, uncomplicated: Secondary | ICD-10-CM | POA: Diagnosis present

## 2011-12-05 DIAGNOSIS — M4120 Other idiopathic scoliosis, site unspecified: Secondary | ICD-10-CM | POA: Diagnosis present

## 2011-12-05 DIAGNOSIS — F3130 Bipolar disorder, current episode depressed, mild or moderate severity, unspecified: Principal | ICD-10-CM | POA: Diagnosis present

## 2011-12-05 DIAGNOSIS — F411 Generalized anxiety disorder: Secondary | ICD-10-CM | POA: Diagnosis present
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
## 2011-12-06 ENCOUNTER — Encounter (HOSPITAL_COMMUNITY): Payer: Self-pay

## 2011-12-06 DIAGNOSIS — F3130 Bipolar disorder, current episode depressed, mild or moderate severity, unspecified: Secondary | ICD-10-CM | POA: Diagnosis present

## 2011-12-06 MED ORDER — NICOTINE 14 MG/24HR TD PT24
14.0000 mg | MEDICATED_PATCH | Freq: Every day | TRANSDERMAL | Status: DC
  Administered 2011-12-07 – 2011-12-08 (×2): 14 mg via TRANSDERMAL
  Filled 2011-12-06 (×5): qty 1

## 2011-12-06 MED ORDER — DICYCLOMINE HCL 20 MG PO TABS
20.0000 mg | ORAL_TABLET | ORAL | Status: DC | PRN
  Administered 2011-12-06 – 2011-12-09 (×5): 20 mg via ORAL
  Filled 2011-12-06: qty 1
  Filled 2011-12-06: qty 8
  Filled 2011-12-06 (×4): qty 1

## 2011-12-06 MED ORDER — ACETAMINOPHEN 325 MG PO TABS: 650.0000 mg | ORAL_TABLET | Freq: Four times a day (QID) | ORAL | Status: DC | PRN

## 2011-12-06 MED ORDER — QUETIAPINE FUMARATE 200 MG PO TABS
200.0000 mg | ORAL_TABLET | Freq: Every day | ORAL | Status: DC
  Administered 2011-12-06 – 2011-12-08 (×3): 200 mg via ORAL
  Filled 2011-12-06 (×4): qty 1

## 2011-12-06 MED ORDER — NAPROXEN 500 MG PO TABS
500.0000 mg | ORAL_TABLET | Freq: Two times a day (BID) | ORAL | Status: DC | PRN
  Administered 2011-12-06 – 2011-12-07 (×2): 500 mg via ORAL
  Filled 2011-12-06 (×2): qty 1

## 2011-12-06 MED ORDER — ONDANSETRON 4 MG PO TBDP
4.0000 mg | ORAL_TABLET | Freq: Four times a day (QID) | ORAL | Status: DC | PRN
  Administered 2011-12-07: 4 mg via ORAL
  Filled 2011-12-06: qty 4

## 2011-12-06 MED ORDER — NICOTINE 21 MG/24HR TD PT24
MEDICATED_PATCH | TRANSDERMAL | Status: AC
  Administered 2011-12-06: 19:00:00
  Filled 2011-12-06: qty 1

## 2011-12-06 MED ORDER — LOPERAMIDE HCL 2 MG PO CAPS
2.0000 mg | ORAL_CAPSULE | ORAL | Status: DC | PRN
  Administered 2011-12-06: 4 mg via ORAL

## 2011-12-06 MED ORDER — CLONIDINE HCL 0.1 MG/24HR TD PTWK
0.1000 mg | MEDICATED_PATCH | TRANSDERMAL | Status: DC
  Filled 2011-12-06: qty 1

## 2011-12-06 MED ORDER — MAGNESIUM HYDROXIDE 400 MG/5ML PO SUSP: 30.0000 mL | Freq: Every day | ORAL | Status: DC | PRN

## 2011-12-06 MED ORDER — HYDROXYZINE HCL 25 MG PO TABS
25.0000 mg | ORAL_TABLET | Freq: Four times a day (QID) | ORAL | Status: DC | PRN
  Administered 2011-12-07 – 2011-12-08 (×3): 25 mg via ORAL
  Filled 2011-12-06 (×2): qty 1

## 2011-12-06 MED ORDER — LIDOCAINE 5 % EX PTCH
1.0000 | MEDICATED_PATCH | CUTANEOUS | Status: DC
  Administered 2011-12-07 – 2011-12-09 (×3): 1 via TRANSDERMAL
  Filled 2011-12-06 (×4): qty 1

## 2011-12-06 MED ORDER — METHOCARBAMOL 500 MG PO TABS
500.0000 mg | ORAL_TABLET | Freq: Three times a day (TID) | ORAL | Status: DC | PRN
  Administered 2011-12-06 – 2011-12-08 (×4): 500 mg via ORAL
  Filled 2011-12-06 (×5): qty 1

## 2011-12-06 MED ORDER — ALUM & MAG HYDROXIDE-SIMETH 200-200-20 MG/5ML PO SUSP: 30.0000 mL | ORAL | Status: DC | PRN

## 2011-12-06 MED ORDER — QUETIAPINE FUMARATE 50 MG PO TABS
50.0000 mg | ORAL_TABLET | Freq: Two times a day (BID) | ORAL | Status: DC
  Administered 2011-12-07 – 2011-12-08 (×2): 50 mg via ORAL
  Filled 2011-12-06 (×8): qty 1

## 2011-12-06 MED ORDER — CLONIDINE HCL 0.1 MG PO TABS
0.1000 mg | ORAL_TABLET | Freq: Four times a day (QID) | ORAL | Status: DC
  Administered 2011-12-06 – 2011-12-09 (×10): 0.1 mg via ORAL
  Filled 2011-12-06 (×14): qty 1

## 2011-12-06 MED ORDER — CARBAMAZEPINE ER 200 MG PO TB12
200.0000 mg | ORAL_TABLET | Freq: Every day | ORAL | Status: DC
  Administered 2011-12-06 – 2011-12-08 (×3): 200 mg via ORAL
  Filled 2011-12-06 (×4): qty 1

## 2011-12-06 MED ORDER — GABAPENTIN 300 MG PO CAPS
300.0000 mg | ORAL_CAPSULE | Freq: Three times a day (TID) | ORAL | Status: DC
  Administered 2011-12-06 – 2011-12-09 (×10): 300 mg via ORAL
  Filled 2011-12-06 (×13): qty 1
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
## 2011-12-06 NOTE — Progress Notes (Signed)
[facility] Adult Inpatient Family/Significant Other Suicide Prevention Education  Suicide Prevention Education:  Contact Attempts: Tressa [phone]- has been identified by the patient as the family member/significant other with whom the patient will be residing, and identified as the person(s) who will aid the patient in the event of a mental health crisis.  With written consent from the patient, two attempts were made to provide suicide prevention education, prior to and/or following the patient's discharge.  We were unsuccessful in providing suicide prevention education.  A suicide education pamphlet was given to the patient to share with family/significant other.  Date and time of first attempt: on 12/06/11 at 5:11 p.m Date and time of second attempt:  Neila Gear 12/06/2011, 5:11 PM
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
## 2011-12-06 NOTE — Progress Notes (Signed)
[facility] Group Notes:  (Counselor/Nursing/MHT/Case Management/Adjunct)  12/06/2011 1315  Type of Therapy:  Group Therapy  Participation Level:  Did Not Attend  Modes of Intervention:  Clarification, Problem-solving, Socialization and Support  Crosswell, Desiree 12/06/2011, 3:53 PM

## 2011-12-06 NOTE — Tx Team (Signed)
Initial Interdisciplinary Treatment Plan  PATIENT STRENGTHS: (choose at least two) Ability for insight Average or above average intelligence General fund of knowledge Motivation for treatment/growth Supportive family/friends Work skills  PATIENT STRESSORS: Marital or family conflict Substance abuse Traumatic event   PROBLEM LIST: Problem List/Patient Goals Date to be addressed Date deferred Reason deferred Estimated date of resolution  Increased risk for SI  3/30     Depression 3/30     Anxiety  3/30                                          DISCHARGE CRITERIA:  Improved stabilization in mood, thinking, and/or behavior Motivation to continue treatment in a less acute level of care Need for constant or close observation no longer present Reduction of life-threatening or endangering symptoms to within safe limits Verbal commitment to aftercare and medication compliance Withdrawal symptoms are absent or subacute and managed without 24-hour nursing intervention  PRELIMINARY DISCHARGE PLAN: Outpatient therapy Return to previous living arrangement  PATIENT/FAMIILY INVOLVEMENT: This treatment plan has been presented to and reviewed with the patient, Mary Hogan.  The patient and family have been given the opportunity to ask questions and make suggestions.  Manfred Shirts,  12/06/2011, 6:57 AM

## 2011-12-06 NOTE — H&P (Signed)
  Pt was seen by me today and I agree with the key elements documented in H&P.  
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
## 2011-12-07 DIAGNOSIS — F3130 Bipolar disorder, current episode depressed, mild or moderate severity, unspecified: Principal | ICD-10-CM

## 2011-12-07 NOTE — Progress Notes (Signed)
Pt positive for evening group, participated appropriately.  Appropriate interaction within milieu, denies SI/HI/hallucinations.  Pt still having some withdrawal symptoms, appropriate prn medication given.  No acute distress.  Support and encouragement offered, will continue to monitor.
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
## 2011-12-07 NOTE — Progress Notes (Signed)
[facility] Group Notes:  (Counselor/Nursing/MHT/Case Management/Adjunct)  12/07/2011 1115AM  Type of Therapy:  Group Therapy  Participation Level:  Did Not Attend  Modes of Intervention:  Clarification, Problem-solving, Socialization and Support  Crosswell, Desiree 12/07/2011, 12:52 PM 

## 2011-12-07 NOTE — Progress Notes (Signed)
[facility] MD Progress Note  12/07/2011 11:24 AM  S: "I feel dizzy when I try to get out of bed. They can't get my blood pressure to come up. I feel like I can't get my words out. My back hurts a lot"  O: VS: BP: Lt. 100/70, Rt.110/70 (Sitting)             BP: LT. 98/58, Rt. 102/70.       Patient is alert and oriented x 3. Sitting up in bed. Responding appropriately with instructions.  Diagnosis:   Axis I: Bipolar disorder, recurrent episode, depressed. Axis II: Deferred Axis III:  Past Medical History  Diagnosis Date  . Headache   . Anxiety   . Depression   . Abnormal Pap smear   . IBS (irritable bowel syndrome)   . Scoliosis    Axis IV: No changes Axis V: 51-60 moderate symptoms  ADL's:  Intact  Sleep: Good  Appetite:  Fair  Suicidal Ideation:  Plan:  No Intent:  No Means:  No Homicidal Ideation:  Plan:  No Intent:  No Means:  No  AEB (as evidenced by):  Mental Status Examination/Evaluation: Objective:  Appearance: Casual  Eye Contact::  Fair  Speech:  Clear  Volume:  Normal  Mood:  "Better"  Affect:  Flat  Thought Process:  Coherent  Orientation:  Full  Thought Content:  Rumination  Suicidal Thoughts:  No  Homicidal Thoughts:  No  Memory:  Immediate;   Good Recent;   Good Remote;   Poor  Judgement:  Poor  Insight:  Fair  Psychomotor Activity:  Normal  Concentration:  Fair  Recall:  Fair  Akathisia:  No  Handed:  Right  AIMS (if indicated):     Assets:  Desire for Improvement  Sleep:  Number of Hours: 5.5    Vital Signs:Blood pressure 82/48, pulse 141, temperature 97.1 F (36.2 C), temperature source Oral, resp. rate 14. Current Medications: Current Facility-Administered Medications  Medication Dose Route Frequency Provider Last Rate Last Dose  . acetaminophen (TYLENOL) tablet 650 mg  650 mg Oral Q6H PRN Mike Craze, MD      . alum & mag hydroxide-simeth (MAALOX/MYLANTA) 200-200-20 MG/5ML suspension 30 mL  30 mL Oral Q4H PRN Mike Craze, MD       . carbamazepine (TEGRETOL XR) 12 hr tablet 200 mg  200 mg Oral QHS Sanjuana Kava, NP   200 mg at 12/06/11 2145  . cloNIDine (CATAPRES) tablet 0.1 mg  0.1 mg Oral QID Sanjuana Kava, NP   0.1 mg at 12/06/11 2146  . dicyclomine (BENTYL) tablet 20 mg  20 mg Oral Q4H PRN Mike Craze, MD   20 mg at 12/06/11 1257  . gabapentin (NEURONTIN) capsule 300 mg  300 mg Oral TID Mike Craze, MD   300 mg at 12/07/11 0858  . hydrOXYzine (ATARAX/VISTARIL) tablet 25 mg  25 mg Oral Q6H PRN Mike Craze, MD      . lidocaine (LIDODERM) 5 % 1 patch  1 patch Transdermal Q24H Sanjuana Kava, NP   1 patch at 12/07/11 [phone]  . loperamide (IMODIUM) capsule 2-4 mg  2-4 mg Oral PRN Mike Craze, MD   4 mg at 12/06/11 1256  . magnesium hydroxide (MILK OF MAGNESIA) suspension 30 mL  30 mL Oral Daily PRN Mike Craze, MD      . methocarbamol (ROBAXIN) tablet 500 mg  500 mg Oral Q8H PRN Mike Craze, MD  500 mg at 12/07/11 0705  . naproxen (NAPROSYN) tablet 500 mg  500 mg Oral BID PRN Mike Craze, MD   500 mg at 12/07/11 0705  . nicotine (NICODERM CQ - dosed in mg/24 hours) 21 mg/24hr patch           . nicotine (NICODERM CQ - dosed in mg/24 hours) patch 14 mg  14 mg Transdermal Q0600 Mike Craze, MD   14 mg at 12/07/11 0706  . ondansetron (ZOFRAN-ODT) disintegrating tablet 4 mg  4 mg Oral Q6H PRN Mike Craze, MD      . QUEtiapine (SEROQUEL) tablet 200 mg  200 mg Oral QHS Sanjuana Kava, NP   200 mg at 12/06/11 2146  . QUEtiapine (SEROQUEL) tablet 50 mg  50 mg Oral BID Sanjuana Kava, NP      . DISCONTD: cloNIDine (CATAPRES - Dosed in mg/24 hr) patch 0.1 mg  0.1 mg Transdermal Weekly Mike Craze, MD        Lab Results: No results found for this or any previous visit (from the past 48 hour(s)).  Physical Findings: AIMS:  , ,  ,  ,    CIWA:  CIWA-Ar Total: 2  COWS:     Treatment Plan Summary: Daily contact with patient to assess and evaluate symptoms and progress in treatment Medication  management  Plan: Encourage out of bed, attend group counseling and activities.           Encouraged fluid intake, Monitor BP closely.           Hold clonidine tablet this am.           Continue current treatment plan.                             Armandina Stammer I 12/07/2011, 11:24 AM

## 2011-12-07 NOTE — Progress Notes (Signed)
D:  Pt did not attended group this evening when offered.  She did eat 50% of meal brought to her at 2130.  Recorded vitals signs and reported to RN.  A:  Encouraged pt attendance to dayroom for groups and peer interactions.  R:  Pt is safe. Aundria Rud, Hilton Saephan L, MHT/NS

## 2011-12-07 NOTE — Progress Notes (Signed)
[facility] Family/Significant Other Suicide Prevention Education  Suicide Prevention Education:  Education Completed; Charles  Revas-[phone]-boyfriend- has been identified by the patient as the family member/significant other with whom the patient will be residing, and identified as the person(s) who will aid the patient in the event of a mental health crisis (suicidal ideations/suicide attempt).  With written consent from the patient, the family member/significant other has been provided the following suicide prevention education, prior to the and/or following the discharge of the patient.  The suicide prevention education provided includes the following:  Suicide risk factors  Suicide prevention and interventions  National Suicide Hotline telephone number  [facility] assessment telephone number  [facility] Emergency Assistance 911  [facility] and/or Residential Mobile Crisis Unit telephone number  Request made of family/significant other to:  Remove weapons (e.g., guns, rifles, knives), all items previously/currently identified as safety concern.  Pt.'s boyfriend states that the pt. does not  have any weapons or guns in the home.  Remove drugs/medications (over-the-counter, prescriptions, illicit drugs), all items previously/currently identified as a safety concern. Pt.'s boyfriend will lock up and monitor the pt.'s medications.  Pt's boyfriend states that the pt.'s overdose was an accident and that the reason that it happened was that the pt.'s was drinking. Pt.'s boyfriend states that he does not think the pt.'s has a problem with alcohol and was asking about when the pt. would be d/c. Boyfriend also stated the pt. had called him and stated that the medications she is taking at [facility] is "making her worse" and pt.'s boyfriend will talk with the nurse when he and pt.'s mother visit the hospital.  The family member/significant other verbalizes  understanding of the suicide prevention education information provided.  The family member/significant other agrees to remove the items of safety concern listed above.  Lamar Blinks Bronson 12/07/2011, 3:31 PM

## 2011-12-07 NOTE — Progress Notes (Signed)
Patient ID: Mary Hogan, female   DOB: Nov 19, 1980, 31 y.o.   MRN: [id number] Has been rather isolative this evening, stayed in her room, interacting little with peers and roommate.  Talked about how overwhelming it is at times with 4 little ones and not much support from her boyfriend.  Stated her mother has been trying to get her to move back to [place] to a house she owns so that she could help her.  States her sister is also here, and would like to come back but BF wants to live in [place] near his mother who has not been supportive. States she didn't intend to have the results she did when she took the flexeril and had been drinking, but realizes how close she had come to dying and wants to change her life so that she can be there for her kids.  Also having some discomfort in her back this evening, heat packs seem to be helping, and had received naproxen and robaxin just prior to start of shift.   Compliant with meds, will continue to try to establish therapeutic relationship, offer support.  Will continue to monitor.

## 2011-12-08 MED ORDER — QUETIAPINE FUMARATE 50 MG PO TABS
50.0000 mg | ORAL_TABLET | Freq: Two times a day (BID) | ORAL | Status: DC
  Administered 2011-12-08 – 2011-12-09 (×2): 50 mg via ORAL
  Filled 2011-12-08 (×5): qty 1

## 2011-12-08 NOTE — Progress Notes (Signed)
Pt attended discharge planning group and actively participated.  Pt presents with calm mood and affect.  Pt was open with sharing reason for entering the hospital.  Pt states that she was drinking and had suicidal thoughts.  Pt states that she is under a lot of stress and has 4 kids she has to care for.  Pt states she lives with them and her boyfriend in [place].  Pt denies substance use, stating this was a one time incident.  Pt states that she has been in the hospital before but states it was a long time ago.  Pt states she follows up at [facility] with Dr. Sandria Manly for medication management.  SW will confirm pt's follow up.  Pt denies having depression or SI now but ranks anxiety at a 5-6 today.  No further needs voiced by pt at this time.  Safety planning and suicide prevention discussed.     Reyes Ivan, [place] 12/08/2011  12:57 PM

## 2011-12-08 NOTE — Progress Notes (Signed)
Patient ID: Mary Hogan, female   DOB: Aug 09, 1981, 31 y.o.   MRN: [id number] Pt is awake and active on the unit this PM. Pt is attending groups and cooperative with staff. Pt denies SI/HI and AVH. Pt c/o abdominal cramping and anxiety. Medications administered per orders. Pt states that she does not wish to remain at [facility] due to family needs. Writer explained that all things are considered for her admission criteria and that her treatment team will work with her to determine the best plan for her care. Pt verbalizes understanding. Writer will continue to monitor.

## 2011-12-08 NOTE — Progress Notes (Signed)
Patient ID: Mary Hogan, female   DOB: 1980-11-23, 31 y.o.   MRN: [id number] Pt has been attending all her groups. Today she has had abdominal cramping, anxiety and some tingling in her legs as well as her chronic back pain.  She reports she generally has felt that detox is going well.  She had a high pulse rate at noon that was 112 manually.  She is interacting with peers and staff.

## 2011-12-08 NOTE — Progress Notes (Signed)
[facility] Group Notes:  (Counselor/Nursing/MHT/Case Management/Adjunct)  1:15PM  Value-driven Life  Type of Therapy:  Group Therapy  Participation Level:  Did Not Attend       Billie Lade 12/08/2011  3:20 PM

## 2011-12-08 NOTE — Progress Notes (Signed)
[facility] MD Progress Note  12/08/2011 3:56 PM  S: "I am doing well except that I feel very anxious right now.       I need my Seroquel dose changed to 0800 and 1400 pm".  Diagnosis:   Axis I: Bipolar, Depressed Axis II: Deferred Axis III:  Past Medical History  Diagnosis Date  . Headache   . Anxiety   . Depression   . Abnormal Pap smear   . IBS (irritable bowel syndrome)   . Scoliosis    Axis IV: other psychosocial or environmental problems and Familial stressors Axis V: 60  ADL's:  Intact  Sleep: Good  Appetite:  Good  Suicidal Ideation:  Plan:  No Intent:  No Means:  No Homicidal Ideation:  Plan:  No Intent:  No Means:  No  AEB (as evidenced by): Per patient's reports.  Mental Status Examination/Evaluation: Objective:  Appearance: Casual  Eye Contact::  Good  Speech:  Clear and Coherent  Volume:  Normal  Mood:  Euthymic  Affect:  Appropriate  Thought Process:  Coherent  Orientation:  Full  Thought Content:  Rumination  Suicidal Thoughts:  No  Homicidal Thoughts:  No  Memory:  Immediate;   Good Recent;   Good Remote;   Fair  Judgement:  Fair  Insight:  Fair  Psychomotor Activity:  Normal  Concentration:  Fair  Recall:  Fair  Akathisia:  No  Handed:  Right  AIMS (if indicated):     Assets:  Desire for Improvement  Sleep:  Number of Hours: 6.75    Vital Signs:Blood pressure 84/44, pulse 66, temperature 97.5 F (36.4 C), temperature source Oral, resp. rate 14. Current Medications: Current Facility-Administered Medications  Medication Dose Route Frequency Provider Last Rate Last Dose  . acetaminophen (TYLENOL) tablet 650 mg  650 mg Oral Q6H PRN Mike Craze, MD      . alum & mag hydroxide-simeth (MAALOX/MYLANTA) 200-200-20 MG/5ML suspension 30 mL  30 mL Oral Q4H PRN Mike Craze, MD      . carbamazepine (TEGRETOL XR) 12 hr tablet 200 mg  200 mg Oral QHS Sanjuana Kava, NP   200 mg at 12/07/11 2133  . cloNIDine (CATAPRES) tablet 0.1 mg  0.1 mg Oral  QID Sanjuana Kava, NP   0.1 mg at 12/08/11 1205  . dicyclomine (BENTYL) tablet 20 mg  20 mg Oral Q4H PRN Mike Craze, MD   20 mg at 12/08/11 0749  . gabapentin (NEURONTIN) capsule 300 mg  300 mg Oral TID Mike Craze, MD   300 mg at 12/08/11 1205  . hydrOXYzine (ATARAX/VISTARIL) tablet 25 mg  25 mg Oral Q6H PRN Mike Craze, MD   25 mg at 12/08/11 0921  . lidocaine (LIDODERM) 5 % 1 patch  1 patch Transdermal Q24H Sanjuana Kava, NP   1 patch at 12/08/11 0749  . loperamide (IMODIUM) capsule 2-4 mg  2-4 mg Oral PRN Mike Craze, MD   4 mg at 12/06/11 1256  . magnesium hydroxide (MILK OF MAGNESIA) suspension 30 mL  30 mL Oral Daily PRN Mike Craze, MD      . methocarbamol (ROBAXIN) tablet 500 mg  500 mg Oral Q8H PRN Mike Craze, MD   500 mg at 12/08/11 1207  . naproxen (NAPROSYN) tablet 500 mg  500 mg Oral BID PRN Mike Craze, MD   500 mg at 12/07/11 0705  . nicotine (NICODERM CQ - dosed in mg/24 hours) patch 14  mg  14 mg Transdermal Q0600 Mike Craze, MD   14 mg at 12/08/11 0606  . ondansetron (ZOFRAN-ODT) disintegrating tablet 4 mg  4 mg Oral Q6H PRN Mike Craze, MD   4 mg at 12/07/11 2054  . QUEtiapine (SEROQUEL) tablet 200 mg  200 mg Oral QHS Sanjuana Kava, NP   200 mg at 12/07/11 2133  . QUEtiapine (SEROQUEL) tablet 50 mg  50 mg Oral BID Sanjuana Kava, NP      . DISCONTD: QUEtiapine (SEROQUEL) tablet 50 mg  50 mg Oral BID Sanjuana Kava, NP   50 mg at 12/08/11 0747    Lab Results: No results found for this or any previous visit (from the past 48 hour(s)).  Physical Findings: AIMS:  , ,  ,  ,    CIWA:  CIWA-Ar Total: 0  COWS:  COWS Total Score: 5   Treatment Plan Summary: Daily contact with patient to assess and evaluate symptoms and progress in treatment Medication management  Plan: Change Seroquel 50 mg dosing to be at 0800, and 1400.           Continue current treatment plan.  Armandina Stammer I 12/08/2011, 3:56 PM

## 2011-12-08 NOTE — Progress Notes (Signed)
[facility] Group Notes: (Counselor/Nursing/MHT/Case Management/Adjunct) 12/08/2011   @11 :00am Overcoming Obstacles to Wellness   Type of Therapy:  Group Therapy  Participation Level:  None  Participation Quality:  Inattentive   Affect:  Blunted  Cognitive:  Appropriate  Insight:  Minimal  Engagement in Group: Minimal  Engagement in Therapy:  None  Modes of Intervention:  Support and Exploration  Summary of Progress/Problems: Mary Hogan  was attentive but not engaged in group process. She did need to be directed a couple of times from side conversations.  Billie Lade 12/08/2011 12:18 PM

## 2011-12-08 NOTE — Tx Team (Signed)
Interdisciplinary Treatment Plan Update (Adult)  Date:  12/08/2011  Time Reviewed:  3:14 PM   Progress in Treatment: Attending groups: Yes, sporaically Participating in groups:  No, does not engage in groups she attends  Taking medication as prescribed:  Yes Tolerating medication: Yes Family/Significant othe contact made:  Yes, contact made with: boyfriend, Steva Colder Patient understands diagnosis: Yes Discussing patient identified problems/goals with staff:  Yes Medical problems stabilized or resolved: Yes Denies suicidal/homicidal ideation: Yes Issues/concerns per patient self-inventory:  No  Other:  New problem(s) identified: None  Reason for Continuation of Hospitalization: Anxiety Medication stabilization  Interventions implemented related to continuation of hospitalization:  Medication stabilization, safety checks q 15 mins, group attendance  Additional comments:  Estimated length of stay: 1-2 days  Discharge Plan: discharge home, follow up with Dr. Sandria [facility]ly at [facility] goal(s):  Review of initial/current patient goals per problem list:   1.  Goal(s):  Reduce potential for self-harm  Met:  Yes  Target date:by discharge  As evidenced by: Mary Hogan reports she is not having suicidal thoughts today  2.  Goal (s): Decrease depressive symptoms to rating of 4 or less  Met:  Yes  Target date: by discharge  As evidenced by: Mary Hogan rates depression at a 0 today  3.  Goal(s):  Decrease anxiety symptoms to rating of 4 or less  Met:  No  Target date: by discharge  As evidenced by: Mary Hogan rates anxiety at 5-6 today  4.  Goal(s):  Medication stabilization  Met:  No  Target date: by discharge  As evidenced by: Mary Hogan's medications are not yet controlling anxiety symptoms and she still presents with flat affect and depressed mood, though denying depressive feelings.  Attendees: Patient:     Family:     Physician:  Dr Mickeal Skinner, MD 12/08/2011  3:14 PM  Nursing:   Neill Loft, RN 12/08/2011 3:14 PM  Case Manager:  Reyes Ivan, LCSWA 12/08/2011 3:14 PM  Counselor:  Angus Palms, LCSW 12/08/2011 3:14 PM  Other:  Reyes Ivan, LCSWA 12/08/2011 3:14 PM  Other:  Serena Colonel, NP 12/08/2011 3:14 PM  Other:  Quintella Reichert, RN 12/08/2011 3:14 PM  Other:      Scribe for Treatment Team:   Billie Lade, 12/08/2011 3:14 PM

## 2011-12-09 LAB — CARBAMAZEPINE LEVEL, TOTAL: Carbamazepine Lvl: 5.7 ug/mL (ref 4.0–12.0)

## 2011-12-09 MED ORDER — LIDOCAINE 5 % EX PTCH: 1.0000 | MEDICATED_PATCH | CUTANEOUS | Status: AC

## 2011-12-09 MED ORDER — DICLOFENAC SODIUM 75 MG PO TBEC: 75.0000 mg | DELAYED_RELEASE_TABLET | Freq: Two times a day (BID) | ORAL | Status: AC

## 2011-12-09 MED ORDER — NICOTINE 21 MG/24HR TD PT24
21.0000 mg | MEDICATED_PATCH | Freq: Every day | TRANSDERMAL | Status: DC
  Filled 2011-12-09: qty 1

## 2011-12-09 MED ORDER — GABAPENTIN 300 MG PO CAPS: 600.0000 mg | ORAL_CAPSULE | Freq: Three times a day (TID) | ORAL | Status: DC

## 2011-12-09 MED ORDER — GABAPENTIN 300 MG PO CAPS
600.0000 mg | ORAL_CAPSULE | Freq: Three times a day (TID) | ORAL | Status: DC
  Administered 2011-12-09: 600 mg via ORAL
  Filled 2011-12-09 (×3): qty 2

## 2011-12-09 MED ORDER — QUETIAPINE FUMARATE 200 MG PO TABS: 200.0000 mg | ORAL_TABLET | Freq: Every day | ORAL | Status: DC

## 2011-12-09 MED ORDER — CLONIDINE HCL 0.1 MG PO TABS: 0.1000 mg | ORAL_TABLET | Freq: Four times a day (QID) | ORAL | Status: DC

## 2011-12-09 MED ORDER — DICYCLOMINE HCL 20 MG PO TABS: 20.0000 mg | ORAL_TABLET | ORAL | Status: DC | PRN

## 2011-12-09 MED ORDER — ONDANSETRON 4 MG PO TBDP: 4.0000 mg | ORAL_TABLET | Freq: Four times a day (QID) | ORAL | Status: AC | PRN

## 2011-12-09 MED ORDER — METHOCARBAMOL 500 MG PO TABS: 500.0000 mg | ORAL_TABLET | Freq: Two times a day (BID) | ORAL | Status: AC

## 2011-12-09 NOTE — Progress Notes (Signed)
[facility] Group Notes: (Counselor/Nursing/MHT/Case Management/Adjunct) 12/09/2011   @ 11:00 Feelings About Diagnosis  Type of Therapy:  Group Therapy  Participation Level:  None  Participation Quality: Attentive   Affect:  Appropriate  Cognitive:  Appropriate  Insight:  None  Engagement in Group: None  Engagement in Therapy:  None  Modes of Intervention:  Support and Exploration  Summary of Progress/Problems: [place]  was attentive but not engaged in group process     Billie Lade 12/09/2011  12:48 PM

## 2011-12-09 NOTE — Tx Team (Signed)
Interdisciplinary Treatment Plan Update (Adult)  Date:  12/09/2011  Time Reviewed:  10:50 AM   Progress in Treatment: Attending groups: Yes Participating in groups:  Yes Taking medication as prescribed: Yes Tolerating medication:  Yes Family/Significant other contact made: Yes, contact made with boyfriend Patient understands diagnosis:  Yes Discussing patient identified problems/goals with staff:  Yes Medical problems stabilized or resolved:  Yes Denies suicidal/homicidal ideation: Yes Issues/concerns per patient self-inventory:  None identified Other: N/A  New problem(s) identified: None Identified  Reason for Continuation of Hospitalization: Stable to d/c  Interventions implemented related to continuation of hospitalization: Stable to d/c  Additional comments: N/A  Estimated length of stay: D/C today  Discharge Plan: Pt will follow up with [facility] for medication management and therapy   New goal(s): N/A  Review of initial/current patient goals per problem list:    1.  Goal(s): Reduce depressive symptoms  Met:  Yes  Target date: by discharge  As evidenced by: Reducing depression from a 10 to a 3 as reported by pt. Pt denies depression.   2.  Goal (s): Reduce/Eliminate suicidal ideation  Met:  Yes  Target date: by discharge  As evidenced by: pt denies SI  3.  Goal(s): Reduce anxiety symptoms  Met:  Yes  Target date: by discharge  As evidenced by: Reduce anxiety from a 10 to a 3 as reported by pt. Pt reports high anxiety but reports feeling stable to d/c today.    Attendees: Patient:  Mary Hogan 12/09/2011 10:53 AM   Family:     Physician:  Orson Aloe, MD  12/09/2011  10:50 AM   Nursing:   Omelia Blackwater, RN 12/09/2011 10:53 AM   Case Manager:  Reyes Ivan, LCSWA 12/09/2011  10:50 AM   Counselor:  Angus Palms, LCSW 12/09/2011  10:50 AM   Other:  Quintella Reichert, RN 12/09/2011  10:50 AM   Other:   12/09/2011  10:50 AM   Other:      Other:      Scribe for Treatment Team:   Carmina Miller, 12/09/2011 , 10:50 AM

## 2011-12-09 NOTE — Progress Notes (Signed)
[facility] Case Management Discharge Plan:  Will you be returning to the same living situation after discharge: Yes,  return home At discharge, do you have transportation home?:Yes,  access to transportation home Do you have the ability to pay for your medications:Yes,  access to meds  Release of information consent forms completed and in the chart;  Patient's signature needed at discharge.  Patient to Follow up at:  Follow-up Information    Follow up with [facility]  - Dr. Sandria Manly on 12/15/2011. (Appointment scheduled at 5:00 pm)    Contact information:   [address] [place], [place] [address] [phone]         Patient denies SI/HI:   Yes,  denies SI/HI    Safety Planning and Suicide Prevention discussed:  Yes,  discussed with pt  Barrier to discharge identified:No.  Summary and Recommendations: Pt attended discharge planning group and actively participated.  Pt presents with calm mood and affect.  Pt denies having depression today but ranks anxiety at a 6-7, stating anxiety is always there.  Pt reports feeling stable to d/c today.  No recommendations from SW.  No further needs voiced by pt.  Pt stable to discharge.     Carmina Miller 12/09/2011, 10:54 AM

## 2011-12-09 NOTE — Discharge Instructions (Signed)
Strongly consider attending at least 6 [employer] Meetings to help you learn about how your helping others to the exclusion of helping yourself is actually hurting yourself and is actually an addiction to fixing others and that you need to work the 12 Step to Happiness through the [employer].  Al-An[employer] Family Groups could be helpful with how to deal with substance abusing family and friends and to learn how to deal with life without getting hooked into helping others too much.

## 2011-12-09 NOTE — Progress Notes (Addendum)
Discharge Note:   Patient discharged home with family member.   Denied SI and HI.   Denied A/V hallucinations.   Denied pain.   Patient received all her discharge information and stated she understood and had no questions.  Patient received her suicide prevention information, which was discussed with her, and stated she understood and had no questions.   Patient received all her belongings, miscellaneous items, prescriptions, medications, phone, candy.   Patient stated she appreciated all the staff has done to assist her while at [facility].

## 2011-12-09 NOTE — Progress Notes (Signed)
Grief and Loss Group  Facilitated grief and loss group on [address] [facility]. Discussed group rules and privacy/confidentiality. Discussed types of loss (re: death, relationships, self-respect) and grief reactions to loss (re: sadness, depression, anger, isolation). Group was mostly quiet, minor progessive engagement. Group members interacted mostly w/ coun rather than one another despite attempts to redirect and encourage open sharing.  Pt was progressively engaged in group, stated that she deals w/ grief by blocking it out. Pt stated that she recently lost 2 family members and she has forced grief out of her mind and chooses not to deal with it. Pt stated that it works for her, also recognized that stuffing emotion has the potential to cause emotions to erupt unexpectedly.  Bedford Winsor B MS, LPCA, NCC
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
## 2011-12-10 NOTE — Progress Notes (Signed)
Patient Discharge Instructions:  Psychiatric Admission Assessment Note Provided,  12/10/2011 After Visit Summary (AVS) Provided,  12/10/2011 Face Sheet Provided, 12/10/2011 Faxed/Sent to the Next Level Care provider:  12/10/2011 Sent Suicide Risk Assessment - Discharge Assessment 12/10/2011  Faxed to [facility] - Dr. Merry Lofty, Mary Hogan, 12/10/2011, 3:39 PM
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
## 2011-12-25 DIAGNOSIS — T50901A Poisoning by unspecified drugs, medicaments and biological substances, accidental (unintentional), initial encounter: Secondary | ICD-10-CM | POA: Insufficient documentation

## 2012-02-19 DIAGNOSIS — Z8669 Personal history of other diseases of the nervous system and sense organs: Secondary | ICD-10-CM | POA: Insufficient documentation

## 2012-03-08 DIAGNOSIS — I341 Nonrheumatic mitral (valve) prolapse: Secondary | ICD-10-CM | POA: Insufficient documentation

## 2012-03-08 DIAGNOSIS — R002 Palpitations: Secondary | ICD-10-CM | POA: Insufficient documentation

## 2012-03-08 DIAGNOSIS — R079 Chest pain, unspecified: Secondary | ICD-10-CM | POA: Insufficient documentation

## 2013-05-02 DIAGNOSIS — I10 Essential (primary) hypertension: Secondary | ICD-10-CM | POA: Insufficient documentation

## 2013-05-02 DIAGNOSIS — R0602 Shortness of breath: Secondary | ICD-10-CM | POA: Insufficient documentation

## 2013-06-14 DIAGNOSIS — F988 Other specified behavioral and emotional disorders with onset usually occurring in childhood and adolescence: Secondary | ICD-10-CM | POA: Insufficient documentation

## 2013-06-14 DIAGNOSIS — F411 Generalized anxiety disorder: Secondary | ICD-10-CM | POA: Insufficient documentation

## 2013-12-23 DIAGNOSIS — J069 Acute upper respiratory infection, unspecified: Secondary | ICD-10-CM | POA: Insufficient documentation

## 2014-07-10 ENCOUNTER — Encounter (HOSPITAL_COMMUNITY): Payer: Self-pay

## 2016-01-02 ENCOUNTER — Encounter (HOSPITAL_COMMUNITY): Payer: Self-pay | Admitting: Emergency Medicine

## 2016-01-02 ENCOUNTER — Emergency Department (HOSPITAL_COMMUNITY)
Admission: EM | Admit: 2016-01-02 | Discharge: 2016-01-03 | Disposition: A | Discharge status: Home / Self Care | Payer: Medicaid Other | Attending: Emergency Medicine | Admitting: Emergency Medicine

## 2016-01-02 DIAGNOSIS — Z3202 Encounter for pregnancy test, result negative: Secondary | ICD-10-CM | POA: Insufficient documentation

## 2016-01-02 DIAGNOSIS — Z8739 Personal history of other diseases of the musculoskeletal system and connective tissue: Secondary | ICD-10-CM | POA: Insufficient documentation

## 2016-01-02 DIAGNOSIS — F419 Anxiety disorder, unspecified: Secondary | ICD-10-CM | POA: Insufficient documentation

## 2016-01-02 DIAGNOSIS — N12 Tubulo-interstitial nephritis, not specified as acute or chronic: Secondary | ICD-10-CM | POA: Insufficient documentation

## 2016-01-02 DIAGNOSIS — F329 Major depressive disorder, single episode, unspecified: Secondary | ICD-10-CM | POA: Insufficient documentation

## 2016-01-02 DIAGNOSIS — Z79899 Other long term (current) drug therapy: Secondary | ICD-10-CM | POA: Insufficient documentation

## 2016-01-02 DIAGNOSIS — Z8719 Personal history of other diseases of the digestive system: Secondary | ICD-10-CM | POA: Insufficient documentation

## 2016-01-02 DIAGNOSIS — Z880 Allergy status to penicillin: Secondary | ICD-10-CM | POA: Insufficient documentation

## 2016-01-02 NOTE — ED Notes (Signed)
Patient states that she was diagnosed with a bladder infection at the beginning of April. Patient says that she did not have money to get medication. She states her left side is in pain.

## 2016-01-03 LAB — URINALYSIS, ROUTINE W REFLEX MICROSCOPIC
BILIRUBIN URINE: NEGATIVE
GLUCOSE, UA: NEGATIVE mg/dL
Hgb urine dipstick: NEGATIVE
KETONES UR: NEGATIVE mg/dL
Nitrite: NEGATIVE
PROTEIN: NEGATIVE mg/dL
Specific Gravity, Urine: 1.009 (ref 1.005–1.030)
pH: 6.5 (ref 5.0–8.0)

## 2016-01-03 LAB — URINE MICROSCOPIC-ADD ON: RBC / HPF: NONE SEEN RBC/hpf (ref 0–5)

## 2016-01-03 LAB — CBC WITH DIFFERENTIAL/PLATELET
BASOS PCT: 0 %
Basophils Absolute: 0 10*3/uL (ref 0.0–0.1)
EOS ABS: 0.2 10*3/uL (ref 0.0–0.7)
EOS PCT: 1 %
HCT: 39.4 % (ref 36.0–46.0)
Hemoglobin: 13.1 g/dL (ref 12.0–15.0)
Lymphocytes Relative: 23 %
Lymphs Abs: 3.2 10*3/uL (ref 0.7–4.0)
MCH: 31 pg (ref 26.0–34.0)
MCHC: 33.2 g/dL (ref 30.0–36.0)
MCV: 93.4 fL (ref 78.0–100.0)
MONO ABS: 0.9 10*3/uL (ref 0.1–1.0)
MONOS PCT: 7 %
NEUTROS PCT: 69 %
Neutro Abs: 9.8 10*3/uL — ABNORMAL HIGH (ref 1.7–7.7)
PLATELETS: 297 10*3/uL (ref 150–400)
RBC: 4.22 MIL/uL (ref 3.87–5.11)
RDW: 13.7 % (ref 11.5–15.5)
WBC: 14.1 10*3/uL — ABNORMAL HIGH (ref 4.0–10.5)

## 2016-01-03 LAB — BASIC METABOLIC PANEL
Anion gap: 10 (ref 5–15)
BUN: 9 mg/dL (ref 6–20)
CALCIUM: 9.1 mg/dL (ref 8.9–10.3)
CO2: 29 mmol/L (ref 22–32)
CREATININE: 0.7 mg/dL (ref 0.44–1.00)
Chloride: 107 mmol/L (ref 101–111)
GFR calc non Af Amer: >60 mL/min (ref >60)
GLUCOSE: 95 mg/dL (ref 65–99)
Potassium: 3.7 mmol/L (ref 3.5–5.1)
Sodium: 146 mmol/L — ABNORMAL HIGH (ref 135–145)

## 2016-01-03 LAB — PREGNANCY, URINE: Preg Test, Ur: NEGATIVE

## 2016-01-03 MED ORDER — HYDROCODONE-ACETAMINOPHEN 5-325 MG PO TABS: 1.0000 | ORAL_TABLET | ORAL | Status: DC | PRN

## 2016-01-03 MED ORDER — SULFAMETHOXAZOLE-TRIMETHOPRIM 800-160 MG PO TABS
1.0000 | ORAL_TABLET | Freq: Once | ORAL | Status: AC
  Administered 2016-01-03: 1 via ORAL
  Filled 2016-01-03: qty 1

## 2016-01-03 MED ORDER — SULFAMETHOXAZOLE-TRIMETHOPRIM 800-160 MG PO TABS: 1.0000 | ORAL_TABLET | Freq: Two times a day (BID) | ORAL | Status: AC

## 2016-01-03 MED ORDER — OXYCODONE-ACETAMINOPHEN 5-325 MG PO TABS
1.0000 | ORAL_TABLET | Freq: Once | ORAL | Status: AC
  Administered 2016-01-03: 1 via ORAL
  Filled 2016-01-03: qty 1

## 2016-01-03 MED ORDER — SODIUM CHLORIDE 0.9 % IV BOLUS (SEPSIS)
1000.0000 mL | Freq: Once | INTRAVENOUS | Status: AC
  Administered 2016-01-03: 1000 mL via INTRAVENOUS

## 2016-01-03 MED ORDER — ONDANSETRON HCL 4 MG/2ML IJ SOLN
4.0000 mg | Freq: Once | INTRAMUSCULAR | Status: AC
  Administered 2016-01-03: 4 mg via INTRAVENOUS
  Filled 2016-01-03: qty 2

## 2016-01-03 MED ORDER — HYDROCODONE-ACETAMINOPHEN 5-325 MG PO TABS
1.0000 | ORAL_TABLET | Freq: Once | ORAL | Status: AC
  Administered 2016-01-03: 1 via ORAL
  Filled 2016-01-03: qty 1

## 2016-01-03 NOTE — ED Provider Notes (Signed)
CSN: [id number]     Arrival date & time 01/02/16  2101 History  By signing my name below, I, Mary Hogan, attest that this documentation has been prepared under the direction and in the presence of Codi Folkerts PA-C. Electronically Signed: Altamease Hogan, ED Scribe. 01/03/2016 1:18 AM  Chief Complaint  Patient presents with  . Flank Pain    The history is provided by the patient. No language interpreter was used.   Mary Hogan is a 35 y.o. female who presents to the Emergency Department complaining of worsening, 10/10 in severity,  left flank pain with onset 3-4 days ago. Pt states that she was diagnosed with a UTI on 12/08/15 after having dysuria but was unable to afford the medication that she was prescribed at that time. Now she is also having fever, flank pain, abdominal pain, nausea, vomiting, and continuation of dysuria. Associated symptoms include right sided chest pain.  No history of DM.   Past Medical History  Diagnosis Date  . Headache(784.0)   . Anxiety   . Depression   . Abnormal Pap smear   . IBS (irritable bowel syndrome)   . Scoliosis    Past Surgical History  Procedure Laterality Date  . Dilation and curettage of uterus    . Laparoscopic ovarian cystectomy  2012  . Breast enhancement surgery  2008   Family History  Problem Relation Age of Onset  . Anesthesia problems Neg Hx   . Hypotension Neg Hx   . Malignant hyperthermia Neg Hx   . Pseudochol deficiency Neg Hx    Social History  Substance Use Topics  . Smoking status: Current Every Day Smoker -- 1.00 packs/day  . Smokeless tobacco: Never Used  . Alcohol Use: No   OB History    Gravida Para Term Preterm AB TAB SAB Ectopic Multiple Living   9 4 2 2 5 2 2 1  4      Review of Systems  Constitutional: Positive for fever.  Respiratory: Negative for cough and shortness of breath.   Cardiovascular: Positive for chest pain.  Gastrointestinal: Positive for nausea and vomiting.  Genitourinary:  Positive for dysuria and flank pain.  Musculoskeletal: Negative for myalgias.  Skin: Negative for rash.  Neurological: Negative for weakness.   Allergies  Ketorolac tromethamine; Amoxicillin; Penicillins; and Tramadol  Home Medications   Prior to Admission medications   Medication Sig Start Date End Date Taking? Authorizing Provider  acetaminophen (TYLENOL) 500 MG tablet Take 1,000 mg by mouth every 6 (six) hours as needed for mild pain, moderate pain or headache.   Yes Historical Provider, MD  clonazePAM (KLONOPIN) 1 MG tablet Take 0.5-1 mg by mouth 3 (three) times daily. 11/20/15  Yes Historical Provider, MD  dicyclomine (BENTYL) 20 MG tablet Take 20 mg by mouth every 6 (six) hours as needed (ibs).  11/20/15  Yes Historical Provider, MD  hydrOXYzine (ATARAX/VISTARIL) 25 MG tablet Take 50 mg by mouth every 8 (eight) hours as needed for anxiety, itching or vomiting.  12/24/15 01/03/16 Yes Historical Provider, MD  temazepam (RESTORIL) 30 MG capsule Take 30 mg by mouth at bedtime. 01/01/16  Yes Historical Provider, MD  ziprasidone (GEODON) 20 MG capsule Take 20 mg by mouth 2 (two) times daily. 01/01/16 03/31/16 Yes Historical Provider, MD  cloNIDine (CATAPRES) 0.1 MG tablet Take 1 tablet (0.1 mg total) by mouth 4 (four) times daily. For opiate withdrawal cramps, the cramps may have a different cause, then this would not be necessary, this must be  tapered gradually. 12/09/11 12/08/12  Darrol Jump, MD  dicyclomine (BENTYL) 20 MG tablet Take 1 tablet (20 mg total) by mouth every 4 (four) hours as needed (abdominal cramping). 12/09/11 12/08/12  Darrol Jump, MD  gabapentin (NEURONTIN) 300 MG capsule Take 2 capsules (600 mg total) by mouth 3 (three) times daily. For anxiety, pain, and migraines 12/09/11 12/08/12  Darrol Jump, MD  QUEtiapine (SEROQUEL) 200 MG tablet Take 1 tablet (200 mg total) by mouth at bedtime. For mood control. 12/09/11 01/08/12  Darrol Jump, MD   BP 121/91 mmHg  Pulse 119  Temp(Src) 98.6  F (37 C) (Oral)  Resp 18  Ht 5\' 6"  (1.676 m)  Wt 145 lb (65.772 kg)  BMI 23.41 kg/m2  SpO2 100% Physical Exam  Constitutional: She is oriented to person, place, and time. She appears well-developed and well-nourished. No distress.  HENT:  Head: Normocephalic and atraumatic.  Eyes: Conjunctivae and EOM are normal.  Neck: Neck supple. No tracheal deviation present.  Pulmonary/Chest: Effort normal. No respiratory distress.  Abdominal: Soft. There is tenderness in the suprapubic area. There is no rigidity.  Suprapubic TTP extending to left flank   Musculoskeletal: Normal range of motion.  Neurological: She is alert and oriented to person, place, and time.  Skin: Skin is warm and dry.  Psychiatric: She has a normal mood and affect. Her behavior is normal.  Nursing note and vitals reviewed.   ED Course  Procedures (including critical care time) DIAGNOSTIC STUDIES: Oxygen Saturation is 100% on RA,  normal by my interpretation.    COORDINATION OF CARE: 1:02 AM Discussed treatment plan which includes lab work and IVF with pt at bedside and pt agreed to plan.  Labs Review Labs Reviewed  CBC WITH DIFFERENTIAL/PLATELET - Abnormal; Notable for the following:    WBC 14.1 (*)    Neutro Abs 9.8 (*)    All other components within normal limits  URINALYSIS, ROUTINE W REFLEX MICROSCOPIC (NOT AT [facility])  PREGNANCY, URINE  BASIC METABOLIC PANEL   Results for orders placed or performed during the hospital encounter of 01/02/16  Urinalysis, Routine w reflex microscopic  Result Value Ref Range   Color, Urine YELLOW YELLOW   APPearance CLOUDY (A) CLEAR   Specific Gravity, Urine 1.009 1.005 - 1.030   pH 6.5 5.0 - 8.0   Glucose, UA NEGATIVE NEGATIVE mg/dL   Hgb urine dipstick NEGATIVE NEGATIVE   Bilirubin Urine NEGATIVE NEGATIVE   Ketones, ur NEGATIVE NEGATIVE mg/dL   Protein, ur NEGATIVE NEGATIVE mg/dL   Nitrite NEGATIVE NEGATIVE   Leukocytes, UA TRACE (A) NEGATIVE  Pregnancy, urine   Result Value Ref Range   Preg Test, Ur NEGATIVE NEGATIVE  Basic metabolic panel  Result Value Ref Range   Sodium 146 (H) 135 - 145 mmol/L   Potassium 3.7 3.5 - 5.1 mmol/L   Chloride 107 101 - 111 mmol/L   CO2 29 22 - 32 mmol/L   Glucose, Bld 95 65 - 99 mg/dL   BUN 9 6 - 20 mg/dL   Creatinine, Ser 0.70 0.44 - 1.00 mg/dL   Calcium 9.1 8.9 - 10.3 mg/dL   GFR calc non Af Amer >60 >60 mL/min   GFR calc Af Amer >60 >60 mL/min   Anion gap 10 5 - 15  CBC with Differential  Result Value Ref Range   WBC 14.1 (H) 4.0 - 10.5 K/uL   RBC 4.22 3.87 - 5.11 MIL/uL   Hemoglobin 13.1 12.0 - 15.0 g/dL  HCT 39.4 36.0 - 46.0 %   MCV 93.4 78.0 - 100.0 fL   MCH 31.0 26.0 - 34.0 pg   MCHC 33.2 30.0 - 36.0 g/dL   RDW 13.7 11.5 - 15.5 %   Platelets 297 150 - 400 K/uL   Neutrophils Relative % 69 %   Neutro Abs 9.8 (H) 1.7 - 7.7 K/uL   Lymphocytes Relative 23 %   Lymphs Abs 3.2 0.7 - 4.0 K/uL   Monocytes Relative 7 %   Monocytes Absolute 0.9 0.1 - 1.0 K/uL   Eosinophils Relative 1 %   Eosinophils Absolute 0.2 0.0 - 0.7 K/uL   Basophils Relative 0 %   Basophils Absolute 0.0 0.0 - 0.1 K/uL  Urine microscopic-add on  Result Value Ref Range   Squamous Epithelial / LPF 6-30 (A) NONE SEEN   WBC, UA 0-5 0 - 5 WBC/hpf   RBC / HPF NONE SEEN 0 - 5 RBC/hpf   Bacteria, UA MANY (A) NONE SEEN   Urine-Other YEAST PRESENT     Imaging Review No results found. I have personally reviewed and evaluated these lab results as part of my medical decision-making.   EKG Interpretation None      MDM   Final diagnoses:  None    1. Pyelonephritis  The patient is non-toxic in appearance. Urinalysis and exam support diagnosis of pyelonephritis. She does not have a fever here. She is felt stable for discharge home. She states she will be able to get the antibiotics and is strongly encouraged to do this.     I personally performed the services described in this documentation, which was scribed in my presence.  The recorded information has been reviewed and is accurate.     Charlann Lange, PA-C 01/04/16 2308  Varney Biles, MD 01/05/16 [phone]

## 2016-01-03 NOTE — ED Notes (Signed)
PA at bedside.

## 2016-01-03 NOTE — Discharge Instructions (Signed)

## 2016-05-22 ENCOUNTER — Emergency Department (HOSPITAL_COMMUNITY)
Admission: EM | Admit: 2016-05-22 | Discharge: 2016-05-22 | Disposition: A | Discharge status: Home / Self Care | Payer: Medicaid Other | Attending: Emergency Medicine | Admitting: Emergency Medicine

## 2016-05-22 ENCOUNTER — Encounter (HOSPITAL_COMMUNITY): Payer: Self-pay | Admitting: Emergency Medicine

## 2016-05-22 DIAGNOSIS — N390 Urinary tract infection, site not specified: Secondary | ICD-10-CM | POA: Diagnosis not present

## 2016-05-22 DIAGNOSIS — R10A1 Flank pain, right side: Secondary | ICD-10-CM

## 2016-05-22 DIAGNOSIS — R109 Unspecified abdominal pain: Secondary | ICD-10-CM

## 2016-05-22 DIAGNOSIS — Z79899 Other long term (current) drug therapy: Secondary | ICD-10-CM | POA: Diagnosis not present

## 2016-05-22 DIAGNOSIS — F17200 Nicotine dependence, unspecified, uncomplicated: Secondary | ICD-10-CM | POA: Insufficient documentation

## 2016-05-22 LAB — URINALYSIS, ROUTINE W REFLEX MICROSCOPIC
Glucose, UA: NEGATIVE mg/dL
HGB URINE DIPSTICK: NEGATIVE
Ketones, ur: NEGATIVE mg/dL
Nitrite: POSITIVE — AB
PROTEIN: NEGATIVE mg/dL
Specific Gravity, Urine: 1.02 (ref 1.005–1.030)
pH: 5 (ref 5.0–8.0)

## 2016-05-22 LAB — BASIC METABOLIC PANEL WITH GFR
Anion gap: 7 (ref 5–15)
BUN: 11 mg/dL (ref 6–20)
CO2: 29 mmol/L (ref 22–32)
Calcium: 9.1 mg/dL (ref 8.9–10.3)
Chloride: 105 mmol/L (ref 101–111)
Creatinine, Ser: 0.91 mg/dL (ref 0.44–1.00)
GFR calc Af Amer: >60 mL/min
GFR calc non Af Amer: >60 mL/min
Glucose, Bld: 108 mg/dL — ABNORMAL HIGH (ref 65–99)
Potassium: 3.4 mmol/L — ABNORMAL LOW (ref 3.5–5.1)
Sodium: 141 mmol/L (ref 135–145)

## 2016-05-22 LAB — URINE MICROSCOPIC-ADD ON

## 2016-05-22 LAB — CBC WITH DIFFERENTIAL/PLATELET
Basophils Absolute: 0 K/uL (ref 0.0–0.1)
Basophils Relative: 0 %
Eosinophils Absolute: 0.1 K/uL (ref 0.0–0.7)
Eosinophils Relative: 1 %
HCT: 40.8 % (ref 36.0–46.0)
Hemoglobin: 13.1 g/dL (ref 12.0–15.0)
Lymphocytes Relative: 21 %
Lymphs Abs: 2.3 K/uL (ref 0.7–4.0)
MCH: 29.4 pg (ref 26.0–34.0)
MCHC: 32.1 g/dL (ref 30.0–36.0)
MCV: 91.5 fL (ref 78.0–100.0)
Monocytes Absolute: 0.4 K/uL (ref 0.1–1.0)
Monocytes Relative: 4 %
Neutro Abs: 7.8 K/uL — ABNORMAL HIGH (ref 1.7–7.7)
Neutrophils Relative %: 74 %
Platelets: 238 K/uL (ref 150–400)
RBC: 4.46 MIL/uL (ref 3.87–5.11)
RDW: 13.1 % (ref 11.5–15.5)
WBC: 10.6 K/uL — ABNORMAL HIGH (ref 4.0–10.5)

## 2016-05-22 LAB — LIPASE, BLOOD: Lipase: 40 U/L (ref 11–51)

## 2016-05-22 MED ORDER — PHENAZOPYRIDINE HCL 200 MG PO TABS: 200.0000 mg | ORAL_TABLET | Freq: Three times a day (TID) | ORAL | 0 refills | Status: DC

## 2016-05-22 MED ORDER — SODIUM CHLORIDE 0.9 % IV BOLUS (SEPSIS)
500.0000 mL | Freq: Once | INTRAVENOUS | Status: AC
  Administered 2016-05-22: 500 mL via INTRAVENOUS

## 2016-05-22 MED ORDER — CIPROFLOXACIN HCL 500 MG PO TABS
500.0000 mg | ORAL_TABLET | Freq: Once | ORAL | Status: AC
  Administered 2016-05-22: 500 mg via ORAL
  Filled 2016-05-22: qty 1

## 2016-05-22 MED ORDER — NAPROXEN 500 MG PO TABS: 500.0000 mg | ORAL_TABLET | Freq: Two times a day (BID) | ORAL | 0 refills | Status: DC

## 2016-05-22 MED ORDER — CIPROFLOXACIN HCL 500 MG PO TABS: 500.0000 mg | ORAL_TABLET | Freq: Two times a day (BID) | ORAL | 0 refills | Status: DC

## 2016-05-22 MED ORDER — ACETAMINOPHEN 325 MG PO TABS
325.0000 mg | ORAL_TABLET | Freq: Once | ORAL | Status: AC
  Administered 2016-05-22: 325 mg via ORAL
  Filled 2016-05-22: qty 1

## 2016-05-22 MED ORDER — OXYCODONE-ACETAMINOPHEN 5-325 MG PO TABS
1.0000 | ORAL_TABLET | Freq: Once | ORAL | Status: AC
  Administered 2016-05-22: 1 via ORAL
  Filled 2016-05-22: qty 1

## 2016-05-22 NOTE — ED Notes (Signed)
PT DISCHARGED. INSTRUCTIONS AND PRESCRIPTIONS GIVEN. AAOX4. PT IN NO APPARENT DISTRESS. THE OPPORTUNITY TO ASK QUESTIONS WAS PROVIDED. 

## 2016-05-22 NOTE — ED Provider Notes (Signed)
[facility] DEPT Provider Note   CSN: [id number] Arrival date & time: 05/22/16  1258     History   Chief Complaint Chief Complaint  Patient presents with  . Flank Pain    HPI Mary Hogan is a 35 y.o. female with a past medical history of chronic UTIs presents to the ED today complaining of dysuria, urgency and frequency 2 weeks. Patient has been taking AZO without relief. She is not having pain in her suprapubic area as well as in her right flank. She also reports subjective fevers, nausea but no vomiting. No hematuria. Patient states she has had multiple UTI infections in the last 6 months including pyelonephritis. She does CT scan done in July which did not show any evidence of stone or renal stranding. She denies any vaginal discharge.  HPI  Past Medical History:  Diagnosis Date  . Abnormal Pap smear   . Anxiety   . Depression   . Headache(784.0)   . IBS (irritable bowel syndrome)   . Scoliosis     Patient Active Problem List   Diagnosis Date Noted  . Bipolar disorder current episode depressed ([place]) 12/06/2011    Class: Acute  . TOBACCO USER 05/26/2009  . AMENORRHEA, SECONDARY 01/26/2007  . DEPRESSIVE DISORDER, NOS 11/05/2006  . RESTLESS LEGS SYNDROME 11/05/2006  . PAPANICOLAOU SMEAR, ABNORMAL 11/05/2006  . PAPANICOLAOU SMEAR, ABNORMAL 11/05/2006    Past Surgical History:  Procedure Laterality Date  . BREAST ENHANCEMENT SURGERY  2008  . DILATION AND CURETTAGE OF UTERUS    . LAPAROSCOPIC OVARIAN CYSTECTOMY  2012    OB History    Gravida Para Term Preterm AB Living   9 4 2 2 5 4    SAB TAB Ectopic Multiple Live Births   2 2 1            Home Medications    Prior to Admission medications   Medication Sig Start Date End Date Taking? Authorizing Provider  acetaminophen (TYLENOL) 500 MG tablet Take 1,500 mg by mouth every 6 (six) hours as needed for mild pain, moderate pain or headache.    Yes Historical Provider, MD  amphetamine-dextroamphetamine  (ADDERALL) 10 MG tablet Take 5 mg by mouth daily 04/30/16  Yes Historical Provider, MD  clonazePAM (KLONOPIN) 1 MG tablet Take 1 mg by mouth at bedtime.  11/20/15  Yes Historical Provider, MD  dicyclomine (BENTYL) 20 MG tablet Take 20 mg by mouth every 6 (six) hours as needed (ibs).  11/20/15  Yes Historical Provider, MD  ibuprofen (ADVIL,MOTRIN) 200 MG tablet Take 600 mg by mouth every 6 (six) hours as needed for moderate pain.   Yes Historical Provider, MD  cloNIDine (CATAPRES) 0.1 MG tablet Take 1 tablet (0.1 mg total) by mouth 4 (four) times daily. For opiate withdrawal cramps, the cramps may have a different cause, then this would not be necessary, this must be tapered gradually. 12/09/11 12/08/12  Darrol Jump, MD  dicyclomine (BENTYL) 20 MG tablet Take 1 tablet (20 mg total) by mouth every 4 (four) hours as needed (abdominal cramping). 12/09/11 12/08/12  Darrol Jump, MD  gabapentin (NEURONTIN) 300 MG capsule Take 2 capsules (600 mg total) by mouth 3 (three) times daily. For anxiety, pain, and migraines 12/09/11 12/08/12  Darrol Jump, MD  HYDROcodone-acetaminophen (NORCO/VICODIN) 5-325 MG tablet Take 1-2 tablets by mouth every 4 (four) hours as needed. Patient not taking: Reported on 05/22/2016 01/03/16   Charlann Lange, PA-C  QUEtiapine (SEROQUEL) 200 MG tablet Take 1 tablet (  200 mg total) by mouth at bedtime. For mood control. 12/09/11 01/08/12  Darrol Jump, MD  ziprasidone (GEODON) 20 MG capsule Take 20 mg by mouth 2 (two) times daily. 01/01/16 03/31/16  Historical Provider, MD    Family History Family History  Problem Relation Age of Onset  . Anesthesia problems Neg Hx   . Hypotension Neg Hx   . Malignant hyperthermia Neg Hx   . Pseudochol deficiency Neg Hx     Social History Social History  Substance Use Topics  . Smoking status: Current Every Day Smoker    Packs/day: 1.00  . Smokeless tobacco: Never Used  . Alcohol use No     Allergies   Ketorolac tromethamine; Amoxicillin;  Penicillins; and Tramadol   Review of Systems Review of Systems  All other systems reviewed and are negative.    Physical Exam Updated Vital Signs BP (!) 146/114 (BP Location: Left Arm)   Pulse 88   Temp 99.2 F (37.3 C) (Oral)   Resp 20   Ht 5\' 6"  (1.676 m)   Wt 65.8 kg   SpO2 100%   BMI 23.40 kg/m   Physical Exam  Constitutional: She is oriented to person, place, and time. She appears well-developed and well-nourished. No distress.  HENT:  Head: Normocephalic and atraumatic.  Mouth/Throat: No oropharyngeal exudate.  Eyes: Conjunctivae and EOM are normal. Pupils are equal, round, and reactive to light. Right eye exhibits no discharge. Left eye exhibits no discharge. No scleral icterus.  Cardiovascular: Normal rate, regular rhythm, normal heart sounds and intact distal pulses.  Exam reveals no gallop and no friction rub.   No murmur heard. Pulmonary/Chest: Effort normal and breath sounds normal. No respiratory distress. She has no wheezes. She has no rales. She exhibits no tenderness.  Abdominal: Soft. Bowel sounds are normal. She exhibits no distension. There is no tenderness. There is no guarding.  R CVA tenderness  Musculoskeletal: Normal range of motion. She exhibits no edema.  Neurological: She is alert and oriented to person, place, and time.  Skin: Skin is warm and dry. No rash noted. She is not diaphoretic. No erythema. No pallor.  Psychiatric: She has a normal mood and affect. Her behavior is normal.  Nursing note and vitals reviewed.    ED Treatments / Results  Labs (all labs ordered are listed, but only abnormal results are displayed) Labs Reviewed  URINALYSIS, ROUTINE W REFLEX MICROSCOPIC (NOT AT [facility]) - Abnormal; Notable for the following:       Result Value   Color, Urine RED (*)    APPearance TURBID (*)    Bilirubin Urine SMALL (*)    Nitrite POSITIVE (*)    Leukocytes, UA MODERATE (*)    All other components within normal limits  URINE  MICROSCOPIC-ADD ON - Abnormal; Notable for the following:    Squamous Epithelial / LPF 6-30 (*)    Bacteria, UA MANY (*)    All other components within normal limits  CBC WITH DIFFERENTIAL/PLATELET - Abnormal; Notable for the following:    WBC 10.6 (*)    Neutro Abs 7.8 (*)    All other components within normal limits  URINE CULTURE  LIPASE, BLOOD  BASIC METABOLIC PANEL    EKG  EKG Interpretation None       Radiology No results found.  Procedures Procedures (including critical care time)  Medications Ordered in ED Medications  oxyCODONE-acetaminophen (PERCOCET/ROXICET) 5-325 MG per tablet 1 tablet (1 tablet Oral Given 05/22/16 1824)  acetaminophen (TYLENOL)  tablet 325 mg (325 mg Oral Given 05/22/16 1824)  ciprofloxacin (CIPRO) tablet 500 mg (500 mg Oral Given 05/22/16 1847)     Initial Impression / Assessment and Plan / ED Course  I have reviewed the triage vital signs and the nursing notes.  Pertinent labs & imaging results that were available during my care of the patient were reviewed by me and considered in my medical decision making (see chart for details).  Clinical Course   35 y.o F with a pmhx of frequent UTIs presents to the ED today c.o dysuria, urinary urgency onset 2 weeks ago. Now with R flank pain onset yesterday. Pt afebrile. No leukocytosis. Upone review of pt records pt has had multiple episodes of UTIS in the past 6 months with 2 episodes of pyelonephritis. Pt had CT can performed in July with did not show any stones or renal stranding. Will not repeat today. Creatinine also wnl. Given normal lab work will treat as outpatient. No urine culture to compare or guide abx selection. Will treat with 10 day course of cipro and give pain medication. Recommend follow up with urology given frequency of UTIs. Return precautions outlined in patient discharge instructions.     Final Clinical Impressions(s) / ED Diagnoses   Final diagnoses:  UTI (lower urinary tract  infection)  Right flank pain    New Prescriptions Discharge Medication List as of 05/22/2016  8:01 PM    START taking these medications   Details  ciprofloxacin (CIPRO) 500 MG tablet Take 1 tablet (500 mg total) by mouth every 12 (twelve) hours., Starting Thu 05/22/2016, Print    naproxen (NAPROSYN) 500 MG tablet Take 1 tablet (500 mg total) by mouth 2 (two) times daily., Starting Thu 05/22/2016, Print    phenazopyridine (PYRIDIUM) 200 MG tablet Take 1 tablet (200 mg total) by mouth 3 (three) times daily., Starting Thu 05/22/2016, Print         Dondra Spry Cit[place], PA-C 05/26/16 1003    Charlesetta Shanks, MD 05/27/16 [phone]

## 2016-05-22 NOTE — Discharge Instructions (Signed)
Take antibiotics as prescribed. Drink plenty of fluids. Follow up with urology as soon as possible for re-evaluation. Return to the ED if you experience severe worsening of your symptoms fevers, vomiting.

## 2016-05-22 NOTE — ED Triage Notes (Signed)
Patient complaining of right sided flank pain x 2 weeks. Reports dysuria, urgency and decreased urine output. Hx of UTIs with similar symptoms.

## 2016-05-25 LAB — URINE CULTURE: Culture: >=100000 — AB

## 2016-05-26 ENCOUNTER — Telehealth (HOSPITAL_BASED_OUTPATIENT_CLINIC_OR_DEPARTMENT_OTHER): Payer: Self-pay | Admitting: Emergency Medicine

## 2016-05-26 NOTE — Telephone Encounter (Signed)
Post ED Visit - Positive Culture Follow-up: Successful Patient Follow-Up  Culture assessed and recommendations reviewed by: []  Elenor Quinones, Pharm.D. []  Heide Guile, Pharm.D., BCPS []  Parks Neptune, Pharm.D. []  Alycia Rossetti, Pharm.D., BCPS []  [place], Pharm.D., BCPS, AAHIVP []  Legrand Como, Pharm.D., BCPS, AAHIVP []  Milus Glazier, Pharm.D. []  Stephens November, Pharm.D. Dimitri Ped PharmD  Positive urine culture  []  Patient discharged without antimicrobial prescription and treatment is now indicated [x]  Organism is resistant to prescribed ED discharge antimicrobial []  Patient with positive blood cultures  Changes discussed with ED provider: Janetta Hora PA New antibiotic prescription symptom check, if symptoms, return to ED for IV antibiotics  Attempting to contact patient   Hazle Nordmann 05/26/2016, 12:22 PM

## 2016-06-05 DIAGNOSIS — F3112 Bipolar disorder, current episode manic without psychotic features, moderate: Secondary | ICD-10-CM | POA: Insufficient documentation

## 2016-06-09 DIAGNOSIS — Z1612 Extended spectrum beta lactamase (ESBL) resistance: Secondary | ICD-10-CM

## 2016-06-09 DIAGNOSIS — N390 Urinary tract infection, site not specified: Secondary | ICD-10-CM

## 2016-06-09 DIAGNOSIS — B9629 Other Escherichia coli [E. coli] as the cause of diseases classified elsewhere: Secondary | ICD-10-CM | POA: Insufficient documentation

## 2016-06-25 ENCOUNTER — Telehealth (HOSPITAL_BASED_OUTPATIENT_CLINIC_OR_DEPARTMENT_OTHER): Payer: Self-pay | Admitting: Emergency Medicine

## 2016-06-25 NOTE — Telephone Encounter (Signed)
LOST TO FOLLOWUP 

## 2016-07-02 ENCOUNTER — Telehealth (HOSPITAL_BASED_OUTPATIENT_CLINIC_OR_DEPARTMENT_OTHER): Payer: Self-pay | Admitting: Emergency Medicine

## 2016-07-02 NOTE — Telephone Encounter (Signed)
Lost to followup 

## 2016-09-02 ENCOUNTER — Encounter (HOSPITAL_COMMUNITY): Payer: Self-pay

## 2016-09-02 DIAGNOSIS — M545 Low back pain: Secondary | ICD-10-CM | POA: Insufficient documentation

## 2016-09-02 DIAGNOSIS — Z5321 Procedure and treatment not carried out due to patient leaving prior to being seen by health care provider: Secondary | ICD-10-CM | POA: Insufficient documentation

## 2016-09-02 LAB — URINALYSIS, ROUTINE W REFLEX MICROSCOPIC
Bilirubin Urine: NEGATIVE
Glucose, UA: NEGATIVE mg/dL
HGB URINE DIPSTICK: NEGATIVE
Ketones, ur: NEGATIVE mg/dL
NITRITE: NEGATIVE
PROTEIN: NEGATIVE mg/dL
SPECIFIC GRAVITY, URINE: 1.012 (ref 1.005–1.030)
pH: 5 (ref 5.0–8.0)

## 2016-09-02 NOTE — ED Triage Notes (Signed)
Pt was treated for a kidney infection in October that turned into ecoli in her kidneys She states that her right lower back hurts and she feels the same as she did then and her urine has a strong odor

## 2016-09-03 ENCOUNTER — Emergency Department (HOSPITAL_COMMUNITY)
Admission: EM | Admit: 2016-09-03 | Discharge: 2016-09-03 | Disposition: A | Discharge status: Home / Self Care | Payer: Medicaid Other | Attending: Dermatology | Admitting: Dermatology

## 2016-09-03 ENCOUNTER — Emergency Department (HOSPITAL_COMMUNITY)
Admission: EM | Admit: 2016-09-03 | Discharge: 2016-09-03 | Disposition: A | Discharge status: Home / Self Care | Payer: Medicaid Other | Attending: Emergency Medicine | Admitting: Emergency Medicine

## 2016-09-03 ENCOUNTER — Encounter (HOSPITAL_COMMUNITY): Payer: Self-pay | Admitting: Emergency Medicine

## 2016-09-03 DIAGNOSIS — R10A0 Flank pain, unspecified side: Secondary | ICD-10-CM

## 2016-09-03 DIAGNOSIS — R109 Unspecified abdominal pain: Secondary | ICD-10-CM

## 2016-09-03 DIAGNOSIS — R1030 Lower abdominal pain, unspecified: Secondary | ICD-10-CM | POA: Insufficient documentation

## 2016-09-03 DIAGNOSIS — F17200 Nicotine dependence, unspecified, uncomplicated: Secondary | ICD-10-CM | POA: Insufficient documentation

## 2016-09-03 LAB — URINALYSIS, ROUTINE W REFLEX MICROSCOPIC
BACTERIA UA: NONE SEEN
Bilirubin Urine: NEGATIVE
Bilirubin Urine: NEGATIVE
GLUCOSE, UA: NEGATIVE mg/dL
GLUCOSE, UA: NEGATIVE mg/dL
Hgb urine dipstick: NEGATIVE
KETONES UR: NEGATIVE mg/dL
KETONES UR: NEGATIVE mg/dL
Leukocytes, UA: NEGATIVE
NITRITE: NEGATIVE
Nitrite: NEGATIVE
PH: 6 (ref 5.0–8.0)
PROTEIN: NEGATIVE mg/dL
Protein, ur: NEGATIVE mg/dL
SPECIFIC GRAVITY, URINE: 1.02 (ref 1.005–1.030)
SQUAMOUS EPITHELIAL / LPF: NONE SEEN
Specific Gravity, Urine: 1.009 (ref 1.005–1.030)
pH: 5 (ref 5.0–8.0)

## 2016-09-03 LAB — COMPREHENSIVE METABOLIC PANEL
ALBUMIN: 3.8 g/dL (ref 3.5–5.0)
ALT: 11 U/L — ABNORMAL LOW (ref 14–54)
ANION GAP: 9 (ref 5–15)
AST: 16 U/L (ref 15–41)
Alkaline Phosphatase: 64 U/L (ref 38–126)
BUN: 12 mg/dL (ref 6–20)
CHLORIDE: 104 mmol/L (ref 101–111)
CO2: 27 mmol/L (ref 22–32)
Calcium: 9.4 mg/dL (ref 8.9–10.3)
Creatinine, Ser: 0.87 mg/dL (ref 0.44–1.00)
GFR calc Af Amer: >60 mL/min (ref >60)
GFR calc non Af Amer: >60 mL/min (ref >60)
GLUCOSE: 98 mg/dL (ref 65–99)
POTASSIUM: 4.5 mmol/L (ref 3.5–5.1)
Sodium: 140 mmol/L (ref 135–145)
TOTAL PROTEIN: 6.8 g/dL (ref 6.5–8.1)
Total Bilirubin: 0.1 mg/dL — ABNORMAL LOW (ref 0.3–1.2)

## 2016-09-03 LAB — CBC
HEMATOCRIT: 43.3 % (ref 36.0–46.0)
HEMOGLOBIN: 14.2 g/dL (ref 12.0–15.0)
MCH: 29.7 pg (ref 26.0–34.0)
MCHC: 32.8 g/dL (ref 30.0–36.0)
MCV: 90.6 fL (ref 78.0–100.0)
Platelets: 314 10*3/uL (ref 150–400)
RBC: 4.78 MIL/uL (ref 3.87–5.11)
RDW: 13 % (ref 11.5–15.5)
WBC: 14.2 10*3/uL — AB (ref 4.0–10.5)

## 2016-09-03 LAB — I-STAT CG4 LACTIC ACID, ED
LACTIC ACID, VENOUS: 1.42 mmol/L (ref 0.5–1.9)
Lactic Acid, Venous: 1.04 mmol/L (ref 0.5–1.9)

## 2016-09-03 LAB — URINALYSIS, MICROSCOPIC (REFLEX): RBC / HPF: NONE SEEN RBC/hpf (ref 0–5)

## 2016-09-03 LAB — I-STAT BETA HCG BLOOD, ED (MC, WL, AP ONLY): I-stat hCG, quantitative: <5 m[IU]/mL (ref <5)

## 2016-09-03 LAB — LIPASE, BLOOD: LIPASE: 55 U/L — AB (ref 11–51)

## 2016-09-03 MED ORDER — IBUPROFEN 800 MG PO TABS
800.0000 mg | ORAL_TABLET | Freq: Once | ORAL | Status: AC
  Administered 2016-09-03: 800 mg via ORAL
  Filled 2016-09-03: qty 1

## 2016-09-03 NOTE — ED Triage Notes (Signed)
Pt sts right flank pain x 3 days; pt sts hx of kidney infection in past; pt sts some dysuria

## 2016-09-03 NOTE — ED Notes (Signed)
Called pt x2 for recheck of v/s No response

## 2016-09-03 NOTE — ED Provider Notes (Signed)
[facility] DEPT Provider Note   CSN: [id number] Arrival date & time: 09/03/16 1521     History    Chief Complaint  Patient presents with  . Abdominal Pain     HPI DANNETTA TILGHMAN is a 35 y.o. female.  35yo F w/ PMH including IBS, bipolar d/o, Pyelonephritis who presents with flank pain. The patient states that a few months ago she had an episode of pyelonephritis requiring IV antibiotics. She began having flank pain in November that she reports was initially left-sided but eventually moved to the right side. She has had intermittent, occasionally severe right-sided flank pain for the past 3 weeks. She endorses associated nausea and vomiting, last episode of vomiting was yesterday. Subjective fevers. She reports some dysuria but denies any hematuria. No vaginal bleeding or discharge. She reports some occasional lower abdominal pain but the pain is predominantly in her flank. She has taken ibuprofen and Dramamine without relief.   Past Medical History:  Diagnosis Date  . Abnormal Pap smear   . Anxiety   . Depression   . Headache(784.0)   . IBS (irritable bowel syndrome)   . Scoliosis      Patient Active Problem List   Diagnosis Date Noted  . Bipolar disorder current episode depressed ([place]) 12/06/2011    Class: Acute  . TOBACCO USER 05/26/2009  . AMENORRHEA, SECONDARY 01/26/2007  . DEPRESSIVE DISORDER, NOS 11/05/2006  . RESTLESS LEGS SYNDROME 11/05/2006  . PAPANICOLAOU SMEAR, ABNORMAL 11/05/2006  . PAPANICOLAOU SMEAR, ABNORMAL 11/05/2006    Past Surgical History:  Procedure Laterality Date  . BREAST ENHANCEMENT SURGERY  2008  . DILATION AND CURETTAGE OF UTERUS    . LAPAROSCOPIC OVARIAN CYSTECTOMY  2012    OB History    Gravida Para Term Preterm AB Living   9 4 2 2 5 4    SAB TAB Ectopic Multiple Live Births   2 2 1             Home Medications    Prior to Admission medications   Medication Sig Start Date End Date Taking? Authorizing Provider    ibuprofen (ADVIL,MOTRIN) 200 MG tablet Take 800 mg by mouth every 6 (six) hours as needed for moderate pain.    Yes Historical Provider, MD  ciprofloxacin (CIPRO) 500 MG tablet Take 1 tablet (500 mg total) by mouth every 12 (twelve) hours. Patient not taking: Reported on 09/03/2016 05/22/16   Samantha Tripp Dowless, PA-C  cloNIDine (CATAPRES) 0.1 MG tablet Take 1 tablet (0.1 mg total) by mouth 4 (four) times daily. For opiate withdrawal cramps, the cramps may have a different cause, then this would not be necessary, this must be tapered gradually. Patient not taking: Reported on 09/03/2016 12/09/11 09/03/16  Darrol Jump, MD  dicyclomine (BENTYL) 20 MG tablet Take 1 tablet (20 mg total) by mouth every 4 (four) hours as needed (abdominal cramping). Patient not taking: Reported on 09/03/2016 12/09/11 09/03/16  Darrol Jump, MD  gabapentin (NEURONTIN) 300 MG capsule Take 2 capsules (600 mg total) by mouth 3 (three) times daily. For anxiety, pain, and migraines Patient not taking: Reported on 09/03/2016 12/09/11 09/03/16  Darrol Jump, MD  HYDROcodone-acetaminophen (NORCO/VICODIN) 5-325 MG tablet Take 1-2 tablets by mouth every 4 (four) hours as needed. Patient not taking: Reported on 09/03/2016 01/03/16   Charlann Lange, PA-C  naproxen (NAPROSYN) 500 MG tablet Take 1 tablet (500 mg total) by mouth 2 (two) times daily. Patient not taking: Reported on 09/03/2016 05/22/16   Dondra Spry  Dowless, PA-C  phenazopyridine (PYRIDIUM) 200 MG tablet Take 1 tablet (200 mg total) by mouth 3 (three) times daily. Patient not taking: Reported on 09/03/2016 05/22/16   Samantha Tripp Dowless, PA-C  QUEtiapine (SEROQUEL) 200 MG tablet Take 1 tablet (200 mg total) by mouth at bedtime. For mood control. Patient not taking: Reported on 09/03/2016 12/09/11 09/03/16  Darrol Jump, MD      Family History  Problem Relation Age of Onset  . Anesthesia problems Neg Hx   . Hypotension Neg Hx   . Malignant hyperthermia Neg Hx    . Pseudochol deficiency Neg Hx      Social History  Substance Use Topics  . Smoking status: Current Every Day Smoker    Packs/day: 1.00  . Smokeless tobacco: Never Used  . Alcohol use No     Allergies     Ketorolac tromethamine; Amoxicillin; Penicillins; and Tramadol    Review of Systems  10 Systems reviewed and are negative for acute change except as noted in the HPI.   Physical Exam Updated Vital Signs BP 125/88 (BP Location: Right Arm)   Pulse 98   Temp 97.4 F (36.3 C) (Oral)   Resp 17   SpO2 100%   Physical Exam  Constitutional: She is oriented to person, place, and time. She appears well-developed and well-nourished. No distress.  HENT:  Head: Normocephalic and atraumatic.  Moist mucous membranes  Eyes: Conjunctivae are normal. Pupils are equal, round, and reactive to light.  Neck: Neck supple.  Cardiovascular: Normal rate, regular rhythm and normal heart sounds.   No murmur heard. Pulmonary/Chest: Effort normal and breath sounds normal.  Abdominal: Soft. Bowel sounds are normal. She exhibits no distension. There is tenderness. There is no rebound and no guarding.  Suprapubic abdominal tenderness  Genitourinary:  Genitourinary Comments: No CVA tenderness  Musculoskeletal: She exhibits no edema.  Neurological: She is alert and oriented to person, place, and time.  Fluent speech  Skin: Skin is warm and dry.  Psychiatric: She has a normal mood and affect. Judgment normal.  Nursing note and vitals reviewed.     ED Treatments / Results  Labs (all labs ordered are listed, but only abnormal results are displayed) Labs Reviewed  LIPASE, BLOOD - Abnormal; Notable for the following:       Result Value   Lipase 55 (*)    All other components within normal limits  COMPREHENSIVE METABOLIC PANEL - Abnormal; Notable for the following:    ALT 11 (*)    Total Bilirubin 0.1 (*)    All other components within normal limits  CBC - Abnormal; Notable for the  following:    WBC 14.2 (*)    All other components within normal limits  URINALYSIS, ROUTINE W REFLEX MICROSCOPIC - Abnormal; Notable for the following:    APPearance CLOUDY (*)    Leukocytes, UA SMALL (*)    All other components within normal limits  URINALYSIS, MICROSCOPIC (REFLEX) - Abnormal; Notable for the following:    Bacteria, UA MANY (*)    Squamous Epithelial / LPF TOO NUMEROUS TO COUNT (*)    All other components within normal limits  URINALYSIS, ROUTINE W REFLEX MICROSCOPIC - Abnormal; Notable for the following:    Color, Urine STRAW (*)    Hgb urine dipstick SMALL (*)    All other components within normal limits  URINE CULTURE  I-STAT BETA HCG BLOOD, ED ([facility], [facility], [facility] ONLY)  I-STAT CG4 LACTIC ACID, ED  I-STAT CG4 LACTIC  ACID, ED     EKG  EKG Interpretation  Date/Time:    Ventricular Rate:    PR Interval:    QRS Duration:   QT Interval:    QTC Calculation:   R Axis:     Text Interpretation:           Radiology No results found.  Procedures Procedures (including critical care time) Procedures  Medications Ordered in ED  Medications  ibuprofen (ADVIL,MOTRIN) tablet 800 mg (800 mg Oral Given 09/03/16 2224)     Initial Impression / Assessment and Plan / ED Course  I have reviewed the triage vital signs and the nursing notes.  Pertinent labs that were available during my care of the patient were reviewed by me and considered in my medical decision making (see chart for details).  Clinical Course     Pt p/w several weeks of intermittent flank pain, initially left but now right flank and associated dysuria. She was well-appearing with normal vital signs on exam. She had mild suprapubic abdominal tenderness, no other abdominal pain. Her lab work here is unremarkable. Initial UA had large amount of squamous cells and bacteria, catheterized UA was unremarkable. She denies any vaginal bleeding or discharge to suggest gynecologic etiology such as cervicitis  or PID. With no hematuria I doubt kidney stone especially given that the pain migrated from left to right. No evidence of pyelonephritis and given the chronicity of her pain I do not feel she needs any emergent imaging such as CT scan today. I discussed supportive care, and gave ibuprofen in the ED. Discussed importance of follow-up with PCP for reevaluation and reviewed return precautions including fever, severe worsening of pain, or change in urine. Patient voiced understanding and was discharged in satisfactory condition.  Final Clinical Impressions(s) / ED Diagnoses   Final diagnoses:  Flank pain     New Prescriptions   No medications on file       Sharlett Iles, MD 09/04/16 [phone]

## 2016-09-03 NOTE — ED Notes (Signed)
Pt verbalized understanding discharge instructions and denies any further needs or questions at this time. VS stable, ambulatory and steady gait.   

## 2016-09-03 NOTE — ED Notes (Signed)
Called pt x1 for recheck of v/s No response

## 2016-09-03 NOTE — ED Notes (Signed)
Pt has had pain for the last three weeks, pt has taken tylenol for pain with no relief.

## 2016-09-05 LAB — URINE CULTURE
Culture: NO GROWTH
Special Requests: NORMAL

## 2016-10-12 ENCOUNTER — Emergency Department (HOSPITAL_COMMUNITY)
Admission: EM | Admit: 2016-10-12 | Discharge: 2016-10-12 | Disposition: A | Discharge status: Home / Self Care | Payer: Medicaid Other | Attending: Emergency Medicine | Admitting: Emergency Medicine

## 2016-10-12 ENCOUNTER — Encounter (HOSPITAL_COMMUNITY): Payer: Self-pay

## 2016-10-12 DIAGNOSIS — F17200 Nicotine dependence, unspecified, uncomplicated: Secondary | ICD-10-CM | POA: Insufficient documentation

## 2016-10-12 DIAGNOSIS — L03116 Cellulitis of left lower limb: Secondary | ICD-10-CM | POA: Insufficient documentation

## 2016-10-12 DIAGNOSIS — Z79899 Other long term (current) drug therapy: Secondary | ICD-10-CM | POA: Diagnosis not present

## 2016-10-12 DIAGNOSIS — M7989 Other specified soft tissue disorders: Secondary | ICD-10-CM | POA: Diagnosis present

## 2016-10-12 MED ORDER — DOXYCYCLINE HYCLATE 100 MG PO CAPS: 100.0000 mg | ORAL_CAPSULE | Freq: Two times a day (BID) | ORAL | 0 refills | Status: AC

## 2016-10-12 NOTE — ED Triage Notes (Signed)
PT C/O A POSSIBLE SPIDER BITE TO THE LEFT LOWER LEG SINCE LAST NIGHT. DENIES FEVER, BUT AREA IS VERY PAINFUL AND WARM TO THE TOUCH.

## 2016-10-12 NOTE — ED Provider Notes (Signed)
[facility] DEPT Provider Note   CSN: [id number] Arrival date & time: 10/12/16  1306   By signing my name below, I, Avnee Patel, attest that this documentation has been prepared under the direction and in the presence of  Giovana Faciane- PA-C. Electronically Signed: Delton [place], ED Scribe. 10/12/16. 2:03 PM.   History   Chief Complaint Chief Complaint  Patient presents with  . Insect Bite    LEFT LEG   The history is provided by the patient. No language interpreter was used.   HPI Comments:  Mary Hogan is a 36 y.o. female who presents to the Emergency Department complaining of a worsening area of redness and swelling to her right lower leg with associated "10/10, burning" pain onset yesterday. She notes she tried to squeeze the area which worsened her symptoms. Pt's friend thinks she was bit by a spider but pt notes she does not recall being bitten and did not see what bit her if she did sustain an insect bite. Pt also reports chills and nausea. No alleviating factors noted. Pt denies any other associated symptoms noted.    Past Medical History:  Diagnosis Date  . Abnormal Pap smear   . Anxiety   . Depression   . Headache(784.0)   . IBS (irritable bowel syndrome)   . Scoliosis     Patient Active Problem List   Diagnosis Date Noted  . Bipolar disorder current episode depressed ([place]) 12/06/2011    Class: Acute  . TOBACCO USER 05/26/2009  . AMENORRHEA, SECONDARY 01/26/2007  . DEPRESSIVE DISORDER, NOS 11/05/2006  . RESTLESS LEGS SYNDROME 11/05/2006  . PAPANICOLAOU SMEAR, ABNORMAL 11/05/2006  . PAPANICOLAOU SMEAR, ABNORMAL 11/05/2006    Past Surgical History:  Procedure Laterality Date  . BREAST ENHANCEMENT SURGERY  2008  . DILATION AND CURETTAGE OF UTERUS    . LAPAROSCOPIC OVARIAN CYSTECTOMY  2012    OB History    Gravida Para Term Preterm AB Living   9 4 2 2 5 4    SAB TAB Ectopic Multiple Live Births   2 2 1            Home Medications    Prior  to Admission medications   Medication Sig Start Date End Date Taking? Authorizing Provider  ciprofloxacin (CIPRO) 500 MG tablet Take 1 tablet (500 mg total) by mouth every 12 (twelve) hours. Patient not taking: Reported on 09/03/2016 05/22/16   Samantha Tripp Dowless, PA-C  cloNIDine (CATAPRES) 0.1 MG tablet Take 1 tablet (0.1 mg total) by mouth 4 (four) times daily. For opiate withdrawal cramps, the cramps may have a different cause, then this would not be necessary, this must be tapered gradually. Patient not taking: Reported on 09/03/2016 12/09/11 09/03/16  Darrol Jump, MD  dicyclomine (BENTYL) 20 MG tablet Take 1 tablet (20 mg total) by mouth every 4 (four) hours as needed (abdominal cramping). Patient not taking: Reported on 09/03/2016 12/09/11 09/03/16  Darrol Jump, MD  doxycycline (VIBRAMYCIN) 100 MG capsule Take 1 capsule (100 mg total) by mouth 2 (two) times daily. 10/12/16 10/19/16  Kirrah Mustin Manuel Daion Ginsberg, [place]  gabapentin (NEURONTIN) 300 MG capsule Take 2 capsules (600 mg total) by mouth 3 (three) times daily. For anxiety, pain, and migraines Patient not taking: Reported on 09/03/2016 12/09/11 09/03/16  Darrol Jump, MD  HYDROcodone-acetaminophen (NORCO/VICODIN) 5-325 MG tablet Take 1-2 tablets by mouth every 4 (four) hours as needed. Patient not taking: Reported on 09/03/2016 01/03/16   Charlann Lange, PA-C  ibuprofen (ADVIL,MOTRIN) 200  MG tablet Take 800 mg by mouth every 6 (six) hours as needed for moderate pain.     Historical Provider, MD  naproxen (NAPROSYN) 500 MG tablet Take 1 tablet (500 mg total) by mouth 2 (two) times daily. Patient not taking: Reported on 09/03/2016 05/22/16   Samantha Tripp Dowless, PA-C  phenazopyridine (PYRIDIUM) 200 MG tablet Take 1 tablet (200 mg total) by mouth 3 (three) times daily. Patient not taking: Reported on 09/03/2016 05/22/16   Samantha Tripp Dowless, PA-C  QUEtiapine (SEROQUEL) 200 MG tablet Take 1 tablet (200 mg total) by mouth at bedtime. For mood  control. Patient not taking: Reported on 09/03/2016 12/09/11 09/03/16  Darrol Jump, MD    Family History Family History  Problem Relation Age of Onset  . Anesthesia problems Neg Hx   . Hypotension Neg Hx   . Malignant hyperthermia Neg Hx   . Pseudochol deficiency Neg Hx     Social History Social History  Substance Use Topics  . Smoking status: Current Every Day Smoker    Packs/day: 1.00  . Smokeless tobacco: Never Used  . Alcohol use No     Allergies   Ketorolac tromethamine; Amoxicillin; Penicillins; and Tramadol   Review of Systems Review of Systems  Constitutional: Positive for chills.  Gastrointestinal: Positive for nausea.  Skin: Positive for color change.   Physical Exam Updated Vital Signs BP 127/83 (BP Location: Right Arm)   Pulse 85   Temp 97.5 F (36.4 C) (Oral)   Resp 17   Ht 5\' 6"  (1.676 m)   Wt 63.5 kg   SpO2 100%   BMI 22.60 kg/m   Physical Exam  Constitutional: She is oriented to person, place, and time. She appears well-developed and well-nourished. No distress.  Well appearing   HENT:  Head: Normocephalic and atraumatic.  Eyes: Conjunctivae are normal. Pupils are equal, round, and reactive to light. Right eye exhibits no discharge. Left eye exhibits no discharge.  Neck: Neck supple.  Cardiovascular: Normal rate, regular rhythm, normal heart sounds and intact distal pulses.   Pulmonary/Chest: Effort normal and breath sounds normal. No respiratory distress.  Abdominal: Soft. There is no tenderness.  Lymphadenopathy:    She has no cervical adenopathy.  Neurological: She is alert and oriented to person, place, and time. Coordination normal.  Skin: Skin is warm and dry. No rash noted. She is not diaphoretic. There is erythema.  See picture below. There is about 8 cm in diameter of redness with a point at the Center. There is warmth and mild swelling and tenderness to the area.  Psychiatric: She has a normal mood and affect. Her behavior is  normal.  Nursing note and vitals reviewed.   ED Treatments / Results  DIAGNOSTIC STUDIES:  Oxygen Saturation is 100% on RA, normal by my interpretation.    COORDINATION OF CARE:  2:01 PM Discussed treatment plan with pt at bedside and pt agreed to plan.  Labs (all labs ordered are listed, but only abnormal results are displayed) Labs Reviewed - No data to display  EKG  EKG Interpretation None       Radiology No results found.  Procedures Procedures (including critical care time)     Medications Ordered in ED Medications - No data to display   Initial Impression / Assessment and Plan / ED Course  I have reviewed the triage vital signs and the nursing notes.  Pertinent labs & imaging results that were available during my care of the patient were reviewed  by me and considered in my medical decision making (see chart for details).      Pt is without gross abscess for which I&D would be possible. Area visualized with ultrasound and did not not show enough area for I&D at this time.  Pt encouraged to return if redness begins to streak, extends beyond where it currently is, and/or fever or nausea/vomiting develop.  Pt is alert, oriented, NAD, afebrile, non tachycardic, nonseptic and nontoxic appearing.  Pt to be d/c on oral antibiotics with strict f/u instructions.  Patient encouraged to follow up with her PCP in 2-5 days regarding today's visit. Return precautions given.  Final Clinical Impressions(s) / ED Diagnoses   Final diagnoses:  Cellulitis of left lower extremity    New Prescriptions Discharge Medication List as of 10/12/2016  2:41 PM    START taking these medications   Details  doxycycline (VIBRAMYCIN) 100 MG capsule Take 1 capsule (100 mg total) by mouth 2 (two) times daily., Starting Sun 10/12/2016, Until Sun 10/19/2016, Print      I personally performed the services described in this documentation, which was scribed in my presence. The recorded information  has been reviewed and is accurate.    [place], [place] 10/12/16 2033    Dorie Rank, MD 10/13/16 [phone]

## 2016-10-12 NOTE — Discharge Instructions (Signed)
Use warm compress to the area. Make sure area is clean. Keep monitoring to make sure area is decreasing in size each day. Follow-up with your primary care provider in 3-5 days as needed.   Get help right away if: Your symptoms get worse. You feel very sleepy. You develop vomiting or diarrhea that persists. You notice red streaks coming from the infected area. Your red area gets larger or turns dark in color.

## 2016-10-12 NOTE — ED Notes (Signed)
PT DISCHARGED. INSTRUCTIONS AND PRESCRIPTION GIVEN. AAOX4. PT IN NO APPARENT DISTRESS. THE OPPORTUNITY TO ASK QUESTIONS WAS PROVIDED. 

## 2017-03-22 ENCOUNTER — Encounter (HOSPITAL_COMMUNITY): Payer: Self-pay

## 2017-03-22 DIAGNOSIS — M419 Scoliosis, unspecified: Secondary | ICD-10-CM | POA: Diagnosis not present

## 2017-03-22 DIAGNOSIS — N10 Acute tubulo-interstitial nephritis: Secondary | ICD-10-CM | POA: Diagnosis not present

## 2017-03-22 DIAGNOSIS — F17200 Nicotine dependence, unspecified, uncomplicated: Secondary | ICD-10-CM | POA: Insufficient documentation

## 2017-03-22 DIAGNOSIS — F329 Major depressive disorder, single episode, unspecified: Secondary | ICD-10-CM | POA: Insufficient documentation

## 2017-03-22 DIAGNOSIS — R1031 Right lower quadrant pain: Secondary | ICD-10-CM | POA: Diagnosis present

## 2017-03-22 DIAGNOSIS — F419 Anxiety disorder, unspecified: Secondary | ICD-10-CM | POA: Diagnosis not present

## 2017-03-22 NOTE — ED Triage Notes (Signed)
Right flank pain with dysuria and frequency x 1 week. Pt has been taking AZO tabs otc with little relief

## 2017-03-23 ENCOUNTER — Emergency Department (HOSPITAL_COMMUNITY)
Admission: EM | Admit: 2017-03-23 | Discharge: 2017-03-23 | Disposition: A | Discharge status: Home / Self Care | Payer: Medicaid Other | Attending: Emergency Medicine | Admitting: Emergency Medicine

## 2017-03-23 DIAGNOSIS — N12 Tubulo-interstitial nephritis, not specified as acute or chronic: Secondary | ICD-10-CM

## 2017-03-23 LAB — URINALYSIS, ROUTINE W REFLEX MICROSCOPIC
Bilirubin Urine: NEGATIVE
GLUCOSE, UA: 50 mg/dL — AB
KETONES UR: NEGATIVE mg/dL
Leukocytes, UA: NEGATIVE
Nitrite: POSITIVE — AB
PH: 5 (ref 5.0–8.0)
Protein, ur: 100 mg/dL — AB
SPECIFIC GRAVITY, URINE: 1.023 (ref 1.005–1.030)

## 2017-03-23 LAB — CBC WITH DIFFERENTIAL/PLATELET
Basophils Absolute: 0 10*3/uL (ref 0.0–0.1)
Basophils Relative: 0 %
Eosinophils Absolute: 0.2 10*3/uL (ref 0.0–0.7)
Eosinophils Relative: 1 %
HCT: 40.2 % (ref 36.0–46.0)
HEMOGLOBIN: 13 g/dL (ref 12.0–15.0)
LYMPHS ABS: 2.5 10*3/uL (ref 0.7–4.0)
LYMPHS PCT: 18 %
MCH: 30.7 pg (ref 26.0–34.0)
MCHC: 32.3 g/dL (ref 30.0–36.0)
MCV: 95 fL (ref 78.0–100.0)
MONO ABS: 1 10*3/uL (ref 0.1–1.0)
MONOS PCT: 7 %
NEUTROS ABS: 10 10*3/uL — AB (ref 1.7–7.7)
NEUTROS PCT: 74 %
PLATELETS: 203 10*3/uL (ref 150–400)
RBC: 4.23 MIL/uL (ref 3.87–5.11)
RDW: 13.2 % (ref 11.5–15.5)
WBC: 13.8 10*3/uL — ABNORMAL HIGH (ref 4.0–10.5)

## 2017-03-23 LAB — BASIC METABOLIC PANEL
ANION GAP: 7 (ref 5–15)
BUN: 14 mg/dL (ref 6–20)
CO2: 26 mmol/L (ref 22–32)
Calcium: 9.1 mg/dL (ref 8.9–10.3)
Chloride: 104 mmol/L (ref 101–111)
Creatinine, Ser: 0.89 mg/dL (ref 0.44–1.00)
GFR calc Af Amer: >60 mL/min (ref >60)
Glucose, Bld: 94 mg/dL (ref 65–99)
POTASSIUM: 3.7 mmol/L (ref 3.5–5.1)
SODIUM: 137 mmol/L (ref 135–145)

## 2017-03-23 MED ORDER — ONDANSETRON 4 MG PO TBDP
4.0000 mg | ORAL_TABLET | Freq: Once | ORAL | Status: AC
  Administered 2017-03-23: 4 mg via ORAL
  Filled 2017-03-23: qty 1

## 2017-03-23 MED ORDER — NITROFURANTOIN MONOHYD MACRO 100 MG PO CAPS
100.0000 mg | ORAL_CAPSULE | Freq: Two times a day (BID) | ORAL | 0 refills | Status: DC
Start: 2017-03-23 — End: 2017-10-02

## 2017-03-23 MED ORDER — CEFTRIAXONE SODIUM 1 G IJ SOLR
1.0000 g | Freq: Once | INTRAMUSCULAR | Status: AC
  Administered 2017-03-23: 1 g via INTRAMUSCULAR
  Filled 2017-03-23: qty 10

## 2017-03-23 MED ORDER — OXYCODONE-ACETAMINOPHEN 5-325 MG PO TABS
1.0000 | ORAL_TABLET | Freq: Once | ORAL | Status: AC
  Administered 2017-03-23: 1 via ORAL
  Filled 2017-03-23: qty 1

## 2017-03-23 NOTE — ED Provider Notes (Signed)
[facility] DEPT Provider Note   CSN: [id number] Arrival date & time: 03/22/17  2321   By signing my name below, I, Eunice Blase, attest that this documentation has been prepared under the direction and in the presence of Brentton Wardlow, Barbette Hair, MD. Electronically signed, Eunice Blase, ED Scribe. 03/23/17. 1:26 AM.  History   Chief Complaint Chief Complaint  Patient presents with  . Flank Pain  . Dysuria   The history is provided by the patient and medical records. No language interpreter was used.    Mary Hogan is a 36 y.o. female presenting to the Emergency Department concerning R flank pain x 1 week. Associated dysuria and frequency since onset of presenting symptoms. Hot and cold chills also noted. 10/10, fluctuating aching to R flank area described. Pt has taken Azo and aspirin with minimal relief. She states this is consistent with h/o UTI's. She reports a prior admission for UTI. Tobacco use noted. No DM, HTN or heart disorder hx. No daily medications. No suspicion for pregnancy. No hematuria, vomiting. No other complaints at this time.   Past Medical History:  Diagnosis Date  . Abnormal Pap smear   . Anxiety   . Depression   . Headache(784.0)   . IBS (irritable bowel syndrome)   . Scoliosis     Patient Active Problem List   Diagnosis Date Noted  . Bipolar disorder current episode depressed ([place]) 12/06/2011    Class: Acute  . TOBACCO USER 05/26/2009  . AMENORRHEA, SECONDARY 01/26/2007  . DEPRESSIVE DISORDER, NOS 11/05/2006  . RESTLESS LEGS SYNDROME 11/05/2006  . PAPANICOLAOU SMEAR, ABNORMAL 11/05/2006  . PAPANICOLAOU SMEAR, ABNORMAL 11/05/2006    Past Surgical History:  Procedure Laterality Date  . BREAST ENHANCEMENT SURGERY  2008  . DILATION AND CURETTAGE OF UTERUS    . LAPAROSCOPIC OVARIAN CYSTECTOMY  2012    OB History    Gravida Para Term Preterm AB Living   9 4 2 2 5 4    SAB TAB Ectopic Multiple Live Births   2 2 1            Home  Medications    Prior to Admission medications   Medication Sig Start Date End Date Taking? Authorizing Provider  aspirin 325 MG tablet Take 325 mg by mouth every 6 (six) hours as needed for mild pain.   Yes [provider]  Phenazopyridine HCl (AZO STANDARD MAXIMUM STRENGTH PO) Take 1 tablet by mouth as needed (for pain).   Yes [provider]  ciprofloxacin (CIPRO) 500 MG tablet Take 1 tablet (500 mg total) by mouth every 12 (twelve) hours. Patient not taking: Reported on 09/03/2016 05/22/16   Dowless, Aldona Bar Tripp, PA-C  cloNIDine (CATAPRES) 0.1 MG tablet Take 1 tablet (0.1 mg total) by mouth 4 (four) times daily. For opiate withdrawal cramps, the cramps may have a different cause, then this would not be necessary, this must be tapered gradually. Patient not taking: Reported on 03/23/2017 12/09/11 03/23/17  Darrol Jump, MD  dicyclomine (BENTYL) 20 MG tablet Take 1 tablet (20 mg total) by mouth every 4 (four) hours as needed (abdominal cramping). Patient not taking: Reported on 03/23/2017 12/09/11 03/23/17  Darrol Jump, MD  gabapentin (NEURONTIN) 300 MG capsule Take 2 capsules (600 mg total) by mouth 3 (three) times daily. For anxiety, pain, and migraines Patient not taking: Reported on 03/23/2017 12/09/11 03/23/17  Darrol Jump, MD  HYDROcodone-acetaminophen (NORCO/VICODIN) 5-325 MG tablet Take 1-2 tablets by mouth every 4 (four) hours as  needed. Patient not taking: Reported on 09/03/2016 01/03/16   Charlann Lange, PA-C  naproxen (NAPROSYN) 500 MG tablet Take 1 tablet (500 mg total) by mouth 2 (two) times daily. Patient not taking: Reported on 09/03/2016 05/22/16   Dowless, Aldona Bar Tripp, PA-C  nitrofurantoin, macrocrystal-monohydrate, (MACROBID) 100 MG capsule Take 1 capsule (100 mg total) by mouth 2 (two) times daily. 03/23/17   Aurore Redinger, Barbette Hair, MD  phenazopyridine (PYRIDIUM) 200 MG tablet Take 1 tablet (200 mg total) by mouth 3 (three) times daily. Patient not taking:  Reported on 09/03/2016 05/22/16   Dowless, Aldona Bar Tripp, PA-C  QUEtiapine (SEROQUEL) 200 MG tablet Take 1 tablet (200 mg total) by mouth at bedtime. For mood control. Patient not taking: Reported on 03/23/2017 12/09/11 03/23/17  Darrol Jump, MD    Family History Family History  Problem Relation Age of Onset  . Anesthesia problems Neg Hx   . Hypotension Neg Hx   . Malignant hyperthermia Neg Hx   . Pseudochol deficiency Neg Hx     Social History Social History  Substance Use Topics  . Smoking status: Current Every Day Smoker    Packs/day: 1.00  . Smokeless tobacco: Never Used  . Alcohol use No     Allergies   Ketorolac tromethamine; Amoxicillin; Penicillins; and Tramadol   Review of Systems Review of Systems  Constitutional: Positive for chills. Negative for fever.  Respiratory: Negative for shortness of breath.   Cardiovascular: Negative for chest pain.  Gastrointestinal: Negative for nausea and vomiting.  Genitourinary: Positive for dysuria, flank pain and frequency. Negative for hematuria.  All other systems reviewed and are negative.    Physical Exam Updated Vital Signs BP (!) 137/93 (BP Location: Left Arm)   Pulse 99   Temp 99 F (37.2 C) (Oral)   Resp 16   Ht 5\' 6"  (1.676 m)   Wt 140 lb (63.5 kg)   SpO2 100%   BMI 22.60 kg/m   Physical Exam  Constitutional: She is oriented to person, place, and time. She appears well-developed and well-nourished.  HENT:  Head: Normocephalic and atraumatic.  Cardiovascular: Normal rate, regular rhythm and normal heart sounds.   Pulmonary/Chest: Effort normal and breath sounds normal. No respiratory distress. She has no wheezes.  Abdominal: Soft. Bowel sounds are normal. She exhibits no mass. There is tenderness. There is no rebound and no guarding.  Suprapubic tenderness to palpation without rebound or guarding  Genitourinary:  Genitourinary Comments: Right-sided CVA tenderness.  Neurological: She is alert and  oriented to person, place, and time.  Skin: Skin is warm and dry.  Psychiatric: She has a normal mood and affect.  Nursing note and vitals reviewed.    ED Treatments / Results  DIAGNOSTIC STUDIES: Oxygen Saturation is 100% on RA, NL by my interpretation.    COORDINATION OF CARE: 1:23 AM-Discussed next steps with pt. Pt verbalized understanding and is agreeable with the plan. Will order medications for pain, abx and review labs before reassessing.   Labs (all labs ordered are listed, but only abnormal results are displayed) Labs Reviewed  URINALYSIS, ROUTINE W REFLEX MICROSCOPIC - Abnormal; Notable for the following:       Result Value   Color, Urine ORANGE (*)    Glucose, UA 50 (*)    Hgb urine dipstick MODERATE (*)    Protein, ur 100 (*)    Nitrite POSITIVE (*)    Bacteria, UA MANY (*)    Squamous Epithelial / LPF 0-5 (*)    All  other components within normal limits  CBC WITH DIFFERENTIAL/PLATELET - Abnormal; Notable for the following:    WBC 13.8 (*)    Neutro Abs 10.0 (*)    All other components within normal limits  URINE CULTURE  BASIC METABOLIC PANEL    EKG  EKG Interpretation None       Radiology No results found.  Procedures Procedures (including critical care time)  Medications Ordered in ED Medications  cefTRIAXone (ROCEPHIN) injection 1 g (1 g Intramuscular Given 03/23/17 0217)  ondansetron (ZOFRAN-ODT) disintegrating tablet 4 mg (4 mg Oral Given 03/23/17 0218)  oxyCODONE-acetaminophen (PERCOCET/ROXICET) 5-325 MG per tablet 1 tablet (1 tablet Oral Given 03/23/17 0218)     Initial Impression / Assessment and Plan / ED Course  I have reviewed the triage vital signs and the nursing notes.  Pertinent labs & imaging results that were available during my care of the patient were reviewed by me and considered in my medical decision making (see chart for details).     Patient with history of UTIs presents with right-sided flank pain and dysuria. She is  nontoxic. Afebrile. She does have CVA tenderness. Urinalysis is nitrite positive with too numerous to count white cells and many bacteria. Culture sent. Basic lab work notable for mild leukocytosis. Patient has a history of ESBL Escherichia coli. I have reviewed prior culture results. It does appear to be susceptible to Macrobid. She was given one dose of Rocephin in the ED. Given that she is otherwise well-appearing, feel a course of outpatient antibiotics is reasonable. Will discharge with Macrobid. She was given strict return precautions including worsening pain, fever or any new or worsening symptoms.  After history, exam, and medical workup I feel the patient has been appropriately medically screened and is safe for discharge home. Pertinent diagnoses were discussed with the patient. Patient was given return precautions.   Final Clinical Impressions(s) / ED Diagnoses   Final diagnoses:  Pyelonephritis    New Prescriptions New Prescriptions   NITROFURANTOIN, MACROCRYSTAL-MONOHYDRATE, (MACROBID) 100 MG CAPSULE    Take 1 capsule (100 mg total) by mouth 2 (two) times daily.   I personally performed the services described in this documentation, which was scribed in my presence. The recorded information has been reviewed and is accurate.    Merryl Hacker, MD 03/23/17 [phone]

## 2017-03-23 NOTE — Discharge Instructions (Signed)
You should return if you have any new or worsening symptoms including worsening pain, fevers, or any new or worsening symptoms.

## 2017-03-25 LAB — URINE CULTURE: Culture: >=100000 — AB

## 2017-03-26 ENCOUNTER — Telehealth: Payer: Self-pay | Admitting: *Deleted

## 2017-03-26 NOTE — Telephone Encounter (Signed)
Post ED Visit - Positive Culture Follow-up  Culture report reviewed by antimicrobial stewardship pharmacist:  []  Elenor Quinones, Pharm.D. []  Heide Guile, Pharm.D., BCPS AQ-ID []  Parks Neptune, Pharm.D., BCPS []  Alycia Rossetti, Pharm.D., BCPS []  Rendon, Pharm.D., BCPS, AAHIVP [x]  Legrand Como, Pharm.D., BCPS, AAHIVP []  Salome Arnt, PharmD, BCPS []  Dimitri Ped, PharmD, BCPS []  Vincenza Hews, PharmD, BCPS  Positive urine culture Treated with Nitrofurantoin Monohyd Macro, organism sensitive to the same and no further patient follow-up is required at this time.  Harlon Flor [facility] 03/26/2017, 3:16 PM

## 2017-10-02 ENCOUNTER — Ambulatory Visit (HOSPITAL_COMMUNITY)
Admission: EM | Admit: 2017-10-02 | Discharge: 2017-10-02 | Disposition: A | Discharge status: Home / Self Care | Payer: Medicaid Other | Attending: Family Medicine | Admitting: Family Medicine

## 2017-10-02 ENCOUNTER — Encounter (HOSPITAL_COMMUNITY): Payer: Self-pay | Admitting: Emergency Medicine

## 2017-10-02 DIAGNOSIS — J069 Acute upper respiratory infection, unspecified: Secondary | ICD-10-CM | POA: Insufficient documentation

## 2017-10-02 DIAGNOSIS — F3130 Bipolar disorder, current episode depressed, mild or moderate severity, unspecified: Secondary | ICD-10-CM | POA: Diagnosis not present

## 2017-10-02 DIAGNOSIS — R51 Headache: Secondary | ICD-10-CM | POA: Diagnosis not present

## 2017-10-02 DIAGNOSIS — F17210 Nicotine dependence, cigarettes, uncomplicated: Secondary | ICD-10-CM | POA: Insufficient documentation

## 2017-10-02 DIAGNOSIS — G2581 Restless legs syndrome: Secondary | ICD-10-CM | POA: Insufficient documentation

## 2017-10-02 DIAGNOSIS — M542 Cervicalgia: Secondary | ICD-10-CM | POA: Diagnosis present

## 2017-10-02 DIAGNOSIS — K589 Irritable bowel syndrome without diarrhea: Secondary | ICD-10-CM | POA: Diagnosis not present

## 2017-10-02 DIAGNOSIS — M419 Scoliosis, unspecified: Secondary | ICD-10-CM | POA: Insufficient documentation

## 2017-10-02 DIAGNOSIS — J029 Acute pharyngitis, unspecified: Secondary | ICD-10-CM | POA: Diagnosis not present

## 2017-10-02 DIAGNOSIS — J00 Acute nasopharyngitis [common cold]: Secondary | ICD-10-CM | POA: Diagnosis not present

## 2017-10-02 DIAGNOSIS — F419 Anxiety disorder, unspecified: Secondary | ICD-10-CM | POA: Insufficient documentation

## 2017-10-02 DIAGNOSIS — Z880 Allergy status to penicillin: Secondary | ICD-10-CM | POA: Insufficient documentation

## 2017-10-02 DIAGNOSIS — Z888 Allergy status to other drugs, medicaments and biological substances status: Secondary | ICD-10-CM | POA: Insufficient documentation

## 2017-10-02 LAB — POCT RAPID STREP A: Streptococcus, Group A Screen (Direct): NEGATIVE

## 2017-10-02 MED ORDER — CROMOLYN SODIUM 5.2 MG/ACT NA AERS: 1.0000 | INHALATION_SPRAY | Freq: Four times a day (QID) | NASAL | 12 refills | Status: DC

## 2017-10-02 NOTE — ED Provider Notes (Signed)
[facility]    CSN: [id number] Arrival date & time: 10/02/17  1619     History   Chief Complaint Chief Complaint  Patient presents with  . URI    HPI GERA INBODEN is a 37 y.o. female.   Letishia presents with complaints of sore throat, facial pressure and neck pain which started 1 week ago. Without fevers, cough, runny nose, gi/gu complaints. States her two young children have also had viral illness. without skin rash. Took three days of keflex which she had left over. Has also taken tylenol and ibuprofen, last this morning. Has tried using azestaline nasal spray as well.  Does not take any medications daily. History of anxiety, depression and headache.    ROS per HPI.       Past Medical History:  Diagnosis Date  . Abnormal Pap smear   . Anxiety   . Depression   . Headache(784.0)   . IBS (irritable bowel syndrome)   . Scoliosis     Patient Active Problem List   Diagnosis Date Noted  . Bipolar disorder current episode depressed ([place]) 12/06/2011    Class: Acute  . TOBACCO USER 05/26/2009  . AMENORRHEA, SECONDARY 01/26/2007  . DEPRESSIVE DISORDER, NOS 11/05/2006  . RESTLESS LEGS SYNDROME 11/05/2006  . PAPANICOLAOU SMEAR, ABNORMAL 11/05/2006  . PAPANICOLAOU SMEAR, ABNORMAL 11/05/2006    Past Surgical History:  Procedure Laterality Date  . BREAST ENHANCEMENT SURGERY  2008  . DILATION AND CURETTAGE OF UTERUS    . LAPAROSCOPIC OVARIAN CYSTECTOMY  2012    OB History    Gravida Para Term Preterm AB Living   9 4 2 2 5 4    SAB TAB Ectopic Multiple Live Births   2 2 1            Home Medications    Prior to Admission medications   Medication Sig Start Date End Date Taking? Authorizing Provider  cromolyn (NASALCROM) 5.2 MG/ACT nasal spray Place 1 spray into both nostrils 4 (four) times daily. 10/02/17   Zigmund Gottron, NP    Family History Family History  Problem Relation Age of Onset  . Anesthesia problems Neg Hx   . Hypotension Neg Hx    . Malignant hyperthermia Neg Hx   . Pseudochol deficiency Neg Hx     Social History Social History   Tobacco Use  . Smoking status: Current Every Day Smoker    Packs/day: 1.00  . Smokeless tobacco: Never Used  Substance Use Topics  . Alcohol use: No  . Drug use: No     Allergies   Ketorolac tromethamine; Amoxicillin; Penicillins; and Tramadol   Review of Systems Review of Systems   Physical Exam Triage Vital Signs ED Triage Vitals  Enc Vitals Group     BP 10/02/17 1647 123/90     Pulse Rate 10/02/17 1647 98     Resp 10/02/17 1647 20     Temp 10/02/17 1647 98.6 F (37 C)     Temp Source 10/02/17 1647 Oral     SpO2 10/02/17 1647 100 %     Weight --      Height --      Head Circumference --      Peak Flow --      Pain Score 10/02/17 1649 5     Pain Loc --      Pain Edu? --      Excl. in [place]? --    No data found.  Updated Vital Signs  BP 123/90 (BP Location: Left Arm)   Pulse 98   Temp 98.6 F (37 C) (Oral)   Resp 20   SpO2 100%   Visual Acuity Right Eye Distance:   Left Eye Distance:   Bilateral Distance:    Right Eye Near:   Left Eye Near:    Bilateral Near:     Physical Exam  Constitutional: She is oriented to person, place, and time. She appears well-developed and well-nourished. No distress.  HENT:  Head: Normocephalic and atraumatic.  Right Ear: Tympanic membrane, external ear and ear canal normal.  Left Ear: Tympanic membrane, external ear and ear canal normal.  Nose: Nose normal.  Mouth/Throat: Uvula is midline, oropharynx is clear and moist and mucous membranes are normal. No tonsillar exudate.  Eyes: Conjunctivae and EOM are normal. Pupils are equal, round, and reactive to light.  Neck: Normal range of motion. No spinous process tenderness and no muscular tenderness present. No neck rigidity. Normal range of motion present.  Cardiovascular: Normal rate, regular rhythm and normal heart sounds.  Pulmonary/Chest: Effort normal and breath  sounds normal.  Neurological: She is alert and oriented to person, place, and time.  Skin: Skin is warm and dry.     UC Treatments / Results  Labs (all labs ordered are listed, but only abnormal results are displayed) Labs Reviewed  CULTURE, GROUP A STREP [facility])  POCT RAPID STREP A    EKG  EKG Interpretation None       Radiology No results found.  Procedures Procedures (including critical care time)  Medications Ordered in UC Medications - No data to display   Initial Impression / Assessment and Plan / UC Course  I have reviewed the triage vital signs and the nursing notes.  Pertinent labs & imaging results that were available during my care of the patient were reviewed by me and considered in my medical decision making (see chart for details).     Patient concerned about strep throat, rapid negative today. Without acute findings on exam. Non toxic in appearance. Vitals stable. History and physical consistent with viral illness. Supportive cares recommended. If symptoms worsen or do not improve in the next week to return to be seen or to follow up with PCP.  Patient verbalized understanding and agreeable to plan.    Final Clinical Impressions(s) / UC Diagnoses   Final diagnoses:  Acute nasopharyngitis    ED Discharge Orders        Ordered    cromolyn (NASALCROM) 5.2 MG/ACT nasal spray  4 times daily     10/02/17 1738       Controlled Substance Prescriptions [place] Controlled Substance Registry consulted? Not Applicable   Zigmund Gottron, NP 10/02/17 1744

## 2017-10-02 NOTE — Discharge Instructions (Signed)
Push fluids to ensure adequate hydration and keep secretions thin.  Tylenol and/or ibuprofen as needed for pain or fevers.  Use of nasal spray up to 4 times a day for the next few days. Mucinex as an expectorant may also be helpful. Your strep was negative, we will call you if the culture returns positive. If symptoms worsen or do not improve in the next week to return to be seen or to follow up with PCP.

## 2017-10-02 NOTE — ED Triage Notes (Signed)
PT C/O: cold sx onset 1 week associated w/sore throat, HA, nasal congestion/drainage, chills  DENIES: fevers  TAKING MEDS: took left over keflex   A&O x4... NAD... Ambulatory

## 2017-10-05 LAB — CULTURE, GROUP A STREP (THRC)

## 2017-10-27 DIAGNOSIS — F1510 Other stimulant abuse, uncomplicated: Secondary | ICD-10-CM | POA: Insufficient documentation

## 2017-12-07 ENCOUNTER — Emergency Department (HOSPITAL_COMMUNITY)
Admission: EM | Admit: 2017-12-07 | Discharge: 2017-12-07 | Disposition: A | Discharge status: Home / Self Care | Payer: Medicaid Other | Attending: Emergency Medicine | Admitting: Emergency Medicine

## 2017-12-07 ENCOUNTER — Encounter (HOSPITAL_COMMUNITY): Payer: Self-pay | Admitting: *Deleted

## 2017-12-07 ENCOUNTER — Emergency Department (HOSPITAL_COMMUNITY): Payer: Medicaid Other

## 2017-12-07 ENCOUNTER — Other Ambulatory Visit: Payer: Self-pay

## 2017-12-07 DIAGNOSIS — Y929 Unspecified place or not applicable: Secondary | ICD-10-CM | POA: Diagnosis not present

## 2017-12-07 DIAGNOSIS — F17200 Nicotine dependence, unspecified, uncomplicated: Secondary | ICD-10-CM | POA: Diagnosis not present

## 2017-12-07 DIAGNOSIS — R6884 Jaw pain: Secondary | ICD-10-CM | POA: Diagnosis not present

## 2017-12-07 DIAGNOSIS — Y999 Unspecified external cause status: Secondary | ICD-10-CM | POA: Insufficient documentation

## 2017-12-07 DIAGNOSIS — S6991XA Unspecified injury of right wrist, hand and finger(s), initial encounter: Secondary | ICD-10-CM | POA: Diagnosis present

## 2017-12-07 DIAGNOSIS — Y9389 Activity, other specified: Secondary | ICD-10-CM | POA: Insufficient documentation

## 2017-12-07 DIAGNOSIS — Z79899 Other long term (current) drug therapy: Secondary | ICD-10-CM | POA: Diagnosis not present

## 2017-12-07 DIAGNOSIS — S60221A Contusion of right hand, initial encounter: Secondary | ICD-10-CM | POA: Diagnosis not present

## 2017-12-07 MED ORDER — ACETAMINOPHEN 500 MG PO TABS
1000.0000 mg | ORAL_TABLET | Freq: Once | ORAL | Status: AC
  Administered 2017-12-07: 1000 mg via ORAL
  Filled 2017-12-07: qty 2
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
## 2017-12-07 NOTE — Discharge Instructions (Signed)
As discussed, it is important to monitor your condition, and if your pain does not improve in spite of ice, Tylenol, ibuprofen, return here for further evaluation.
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
## 2017-12-13 ENCOUNTER — Ambulatory Visit (HOSPITAL_COMMUNITY)
Admission: EM | Admit: 2017-12-13 | Discharge: 2017-12-13 | Disposition: A | Discharge status: Home / Self Care | Payer: Medicaid Other | Attending: Physician Assistant | Admitting: Physician Assistant

## 2017-12-13 ENCOUNTER — Encounter (HOSPITAL_COMMUNITY): Payer: Self-pay | Admitting: Emergency Medicine

## 2017-12-13 DIAGNOSIS — S60211A Contusion of right wrist, initial encounter: Secondary | ICD-10-CM

## 2017-12-13 DIAGNOSIS — M25531 Pain in right wrist: Secondary | ICD-10-CM

## 2017-12-13 DIAGNOSIS — M79641 Pain in right hand: Secondary | ICD-10-CM

## 2017-12-13 DIAGNOSIS — S60219A Contusion of unspecified wrist, initial encounter: Secondary | ICD-10-CM

## 2017-12-13 MED ORDER — IBUPROFEN 600 MG PO TABS: 600.0000 mg | ORAL_TABLET | Freq: Four times a day (QID) | ORAL | 0 refills | Status: DC | PRN
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
## 2017-12-13 NOTE — Discharge Instructions (Addendum)
Please ice the hand when you can by placing an ice pack right on top of the splint.  I would wear the splint for the next week or 2 to prevent from moving the hand.
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
## 2018-01-07 ENCOUNTER — Encounter: Payer: Self-pay | Admitting: Family Medicine

## 2018-01-18 ENCOUNTER — Encounter (HOSPITAL_COMMUNITY): Payer: Self-pay | Admitting: Emergency Medicine

## 2018-01-18 ENCOUNTER — Ambulatory Visit (HOSPITAL_COMMUNITY)
Admission: EM | Admit: 2018-01-18 | Discharge: 2018-01-18 | Disposition: A | Discharge status: Home / Self Care | Payer: Medicaid Other | Attending: Family Medicine | Admitting: Family Medicine

## 2018-01-18 ENCOUNTER — Other Ambulatory Visit: Payer: Self-pay

## 2018-01-18 DIAGNOSIS — F17210 Nicotine dependence, cigarettes, uncomplicated: Secondary | ICD-10-CM | POA: Diagnosis not present

## 2018-01-18 DIAGNOSIS — F419 Anxiety disorder, unspecified: Secondary | ICD-10-CM | POA: Insufficient documentation

## 2018-01-18 DIAGNOSIS — Z880 Allergy status to penicillin: Secondary | ICD-10-CM | POA: Insufficient documentation

## 2018-01-18 DIAGNOSIS — R300 Dysuria: Secondary | ICD-10-CM | POA: Diagnosis present

## 2018-01-18 DIAGNOSIS — Z3202 Encounter for pregnancy test, result negative: Secondary | ICD-10-CM

## 2018-01-18 DIAGNOSIS — F3130 Bipolar disorder, current episode depressed, mild or moderate severity, unspecified: Secondary | ICD-10-CM | POA: Insufficient documentation

## 2018-01-18 DIAGNOSIS — G2581 Restless legs syndrome: Secondary | ICD-10-CM | POA: Diagnosis not present

## 2018-01-18 DIAGNOSIS — R109 Unspecified abdominal pain: Secondary | ICD-10-CM | POA: Insufficient documentation

## 2018-01-18 DIAGNOSIS — Z79899 Other long term (current) drug therapy: Secondary | ICD-10-CM | POA: Diagnosis not present

## 2018-01-18 DIAGNOSIS — N898 Other specified noninflammatory disorders of vagina: Secondary | ICD-10-CM

## 2018-01-18 DIAGNOSIS — R1031 Right lower quadrant pain: Secondary | ICD-10-CM

## 2018-01-18 DIAGNOSIS — M419 Scoliosis, unspecified: Secondary | ICD-10-CM | POA: Insufficient documentation

## 2018-01-18 DIAGNOSIS — R21 Rash and other nonspecific skin eruption: Secondary | ICD-10-CM | POA: Insufficient documentation

## 2018-01-18 DIAGNOSIS — Z888 Allergy status to other drugs, medicaments and biological substances status: Secondary | ICD-10-CM | POA: Insufficient documentation

## 2018-01-18 DIAGNOSIS — K589 Irritable bowel syndrome without diarrhea: Secondary | ICD-10-CM | POA: Diagnosis not present

## 2018-01-18 LAB — POCT URINALYSIS DIP (DEVICE)
Glucose, UA: 250 mg/dL — AB
Nitrite: POSITIVE — AB
PH: 5 (ref 5.0–8.0)
Protein, ur: 100 mg/dL — AB
SPECIFIC GRAVITY, URINE: 1.015 (ref 1.005–1.030)
UROBILINOGEN UA: 4 mg/dL — AB (ref 0.0–1.0)

## 2018-01-18 LAB — POCT PREGNANCY, URINE: Preg Test, Ur: NEGATIVE

## 2018-01-18 MED ORDER — FLUCONAZOLE 150 MG PO TABS: 150.0000 mg | ORAL_TABLET | Freq: Once | ORAL | 0 refills | Status: AC

## 2018-01-18 MED ORDER — NITROFURANTOIN MONOHYD MACRO 100 MG PO CAPS: 100.0000 mg | ORAL_CAPSULE | Freq: Two times a day (BID) | ORAL | 0 refills | Status: AC

## 2018-01-18 MED ORDER — PREDNISONE 50 MG PO TABS: 50.0000 mg | ORAL_TABLET | Freq: Every day | ORAL | 0 refills | Status: AC

## 2018-01-18 NOTE — ED Provider Notes (Addendum)
[facility]    CSN: [id number] Arrival date & time: 01/18/18  1125     History   Chief Complaint Chief Complaint  Patient presents with  . Abdominal Pain  . Dysuria    HPI Mary Hogan is a 37 y.o. female noncontributing past medical history presenting today for evaluation of abdominal pain and dysuria as well as a rash.  Patient states that for the past 4 days she has developed some lower abdominal discomfort that radiates to her back as well as burning with urination.  She denies any increased frequency, but endorses urgency and sensation of incomplete voiding.  Starting to notice an abnormal discharge and irritation which she is unsure if it is related to the urination or vaginal area.  Patient does not have regular cycles as she has had an ablation.  Denies fevers, nausea, vomiting.  Has also had a rash breakout on bilateral upper extremities over the past couple days.  Associated with itching and irritation.  Does note that she recently changed her detergent.  Has been taking Benadryl with minimal relief.  HPI  Past Medical History:  Diagnosis Date  . Abnormal Pap smear   . Anxiety   . Depression   . Headache(784.0)   . IBS (irritable bowel syndrome)   . Scoliosis     Patient Active Problem List   Diagnosis Date Noted  . Bipolar disorder current episode depressed ([place]) 12/06/2011    Class: Acute  . TOBACCO USER 05/26/2009  . AMENORRHEA, SECONDARY 01/26/2007  . DEPRESSIVE DISORDER, NOS 11/05/2006  . RESTLESS LEGS SYNDROME 11/05/2006  . PAPANICOLAOU SMEAR, ABNORMAL 11/05/2006  . PAPANICOLAOU SMEAR, ABNORMAL 11/05/2006    Past Surgical History:  Procedure Laterality Date  . BREAST ENHANCEMENT SURGERY  2008  . DILATION AND CURETTAGE OF UTERUS    . LAPAROSCOPIC OVARIAN CYSTECTOMY  2012    OB History    Gravida  9   Para  4   Term  2   Preterm  2   AB  5   Living  4     SAB  2   TAB  2   Ectopic  1   Multiple      Live Births                 Home Medications    Prior to Admission medications   Medication Sig Start Date End Date Taking? Authorizing Provider  clonazePAM (KLONOPIN) 0.5 MG tablet Take 0.5 mg by mouth at bedtime.   Yes [provider]  perphenazine (TRILAFON) 2 MG tablet Take 2 mg by mouth at bedtime.    Yes [provider]  phenazopyridine (PYRIDIUM) 95 MG tablet Take 95 mg by mouth 3 (three) times daily as needed for pain.   Yes [provider]  fluconazole (DIFLUCAN) 150 MG tablet Take 1 tablet (150 mg total) by mouth once for 1 dose. 01/18/18 01/18/18  Dorla Guizar C, PA-C  nitrofurantoin, macrocrystal-monohydrate, (MACROBID) 100 MG capsule Take 1 capsule (100 mg total) by mouth 2 (two) times daily for 5 days. 01/18/18 01/23/18  Nova Evett C, PA-C  predniSONE (DELTASONE) 50 MG tablet Take 1 tablet (50 mg total) by mouth daily for 3 days. 01/18/18 01/21/18  Danica Camarena, Elesa Hacker, PA-C    Family History Family History  Problem Relation Age of Onset  . Anesthesia problems Neg Hx   . Hypotension Neg Hx   . Malignant hyperthermia Neg Hx   . Pseudochol deficiency Neg Hx  Social History Social History   Tobacco Use  . Smoking status: Current Every Day Smoker    Packs/day: 1.00  . Smokeless tobacco: Never Used  Substance Use Topics  . Alcohol use: No  . Drug use: No     Allergies   Ketorolac tromethamine; Amoxicillin; Penicillins; and Tramadol   Review of Systems Review of Systems  Constitutional: Negative for fever.  Respiratory: Negative for shortness of breath.   Cardiovascular: Negative for chest pain.  Gastrointestinal: Positive for abdominal pain. Negative for diarrhea, nausea and vomiting.  Genitourinary: Positive for dysuria and vaginal discharge. Negative for flank pain, genital sores, hematuria, menstrual problem, vaginal bleeding and vaginal pain.  Musculoskeletal: Negative for back pain.  Skin: Positive for rash.  Neurological: Negative  for dizziness, light-headedness and headaches.     Physical Exam Triage Vital Signs ED Triage Vitals  Enc Vitals Group     BP 01/18/18 1141 119/81     Pulse Rate 01/18/18 1141 97     Resp 01/18/18 1141 18     Temp 01/18/18 1141 98.2 F (36.8 C)     Temp Source 01/18/18 1141 Oral     SpO2 01/18/18 1141 99 %     Weight --      Height --      Head Circumference --      Peak Flow --      Pain Score 01/18/18 1139 9     Pain Loc --      Pain Edu? --      Excl. in [place]? --    No data found.  Updated Vital Signs BP 119/81 (BP Location: Left Arm)   Pulse 97   Temp 98.2 F (36.8 C) (Oral)   Resp 18   SpO2 99%   Visual Acuity Right Eye Distance:   Left Eye Distance:   Bilateral Distance:    Right Eye Near:   Left Eye Near:    Bilateral Near:     Physical Exam  Constitutional: She appears well-developed and well-nourished. No distress.  HENT:  Head: Normocephalic and atraumatic.  Eyes: Conjunctivae are normal.  Neck: Neck supple.  Cardiovascular: Normal rate and regular rhythm.  No murmur heard. Pulmonary/Chest: Effort normal and breath sounds normal. No respiratory distress.  Abdominal: Soft. There is tenderness.  Tenderness to right lower quadrant, negative rebound, negative Rovsing's.  Negative CVA tenderness.  Musculoskeletal: She exhibits no edema.  Neurological: She is alert.  Skin: Skin is warm and dry. Rash noted.  Macular erythematous rash to bilateral upper extremities, blanchable.   Psychiatric: She has a normal mood and affect.  Nursing note and vitals reviewed.    UC Treatments / Results  Labs (all labs ordered are listed, but only abnormal results are displayed) Labs Reviewed  POCT URINALYSIS DIP (DEVICE) - Abnormal; Notable for the following components:      Result Value   Glucose, UA 250 (*)    Bilirubin Urine SMALL (*)    Ketones, ur TRACE (*)    Hgb urine dipstick TRACE (*)    Protein, ur 100 (*)    Urobilinogen, UA 4.0 (*)    Nitrite  POSITIVE (*)    Leukocytes, UA LARGE (*)    All other components within normal limits  URINE CULTURE  POCT PREGNANCY, URINE  CERVICOVAGINAL ANCILLARY ONLY    EKG None  Radiology No results found.  Procedures Procedures (including critical care time)  Medications Ordered in UC Medications - No data to display  Initial  Impression / Assessment and Plan / UC Course  I have reviewed the triage vital signs and the nursing notes.  Pertinent labs & imaging results that were available during my care of the patient were reviewed by me and considered in my medical decision making (see chart for details).     Patient with positive nitrite and large leukocytes on UA.  Will treat for UTI with Macrobid.  Vital signs stable, less likely [place] at this time.  Also obtain a vaginal swab to check for yeast and BV as other causes of dysuria/irritation. Discussed strict return precautions. Patient verbalized understanding and is agreeable with plan.  Rash appears allergic-like versus contact dermatitis.  Will recommend antihistamines.  Prednisone for couple days.  Possibly related to recent detergent change, recommended to return back to previous detergent or shortness should another one.   Final Clinical Impressions(s) / UC Diagnoses   Final diagnoses:  Dysuria     Discharge Instructions     Urine showed evidence of infection. We are treating you with macrobid. Be sure to take full course. Stay hydrated- urine should be pale yellow to clear. My continue azo for relief of burning while infection is being cleared.   I have sent in 2 tablets of Diflucan, which you may take after the antibiotic if you develop itching and irritation.  We are also sending the vaginal swab off to check for yeast and bacterial vaginosis as well as STDs.  We will call you with these results to confirm any positive results and send in any further needed medications.  Please return or follow up with your primary  provider if symptoms not improving with treatment. Please return sooner if you have worsening of symptoms or develop fever, nausea, vomiting, abdominal pain, back pain, lightheadedness, dizziness.  Rash:  Please take prednisone for the next 3 days, continue Benadryl and/or Zyrtec twice daily as this rash persists.  Please switch back to your original detergent.  Please return if rash changing or not improving.    ED Prescriptions    Medication Sig Dispense Auth. Provider   nitrofurantoin, macrocrystal-monohydrate, (MACROBID) 100 MG capsule Take 1 capsule (100 mg total) by mouth 2 (two) times daily for 5 days. 10 capsule Keltie Labell C, PA-C   fluconazole (DIFLUCAN) 150 MG tablet Take 1 tablet (150 mg total) by mouth once for 1 dose. 2 tablet Breland Elders C, PA-C   predniSONE (DELTASONE) 50 MG tablet Take 1 tablet (50 mg total) by mouth daily for 3 days. 3 tablet Shaketa Serafin C, PA-C     Controlled Substance Prescriptions [place] Controlled Substance Registry consulted? Not Applicable   Janith Lima, PA-C 01/18/18 1217    Roshawn Ayala, [place] C, [place] 01/18/18 1218

## 2018-01-18 NOTE — ED Triage Notes (Signed)
The patient presented to the [facility] with a complaint of lower right abdominal pain and dysuria x 4 days.

## 2018-01-18 NOTE — Discharge Instructions (Addendum)
Urine showed evidence of infection. We are treating you with macrobid. Be sure to take full course. Stay hydrated- urine should be pale yellow to clear. My continue azo for relief of burning while infection is being cleared.   I have sent in 2 tablets of Diflucan, which you may take after the antibiotic if you develop itching and irritation.  We are also sending the vaginal swab off to check for yeast and bacterial vaginosis as well as STDs.  We will call you with these results to confirm any positive results and send in any further needed medications.  Please return or follow up with your primary provider if symptoms not improving with treatment. Please return sooner if you have worsening of symptoms or develop fever, nausea, vomiting, abdominal pain, back pain, lightheadedness, dizziness.  Rash:  Please take prednisone for the next 3 days, continue Benadryl and/or Zyrtec twice daily as this rash persists.  Please switch back to your original detergent.  Please return if rash changing or not improving.

## 2018-01-19 ENCOUNTER — Telehealth (HOSPITAL_COMMUNITY): Payer: Self-pay

## 2018-01-19 LAB — CERVICOVAGINAL ANCILLARY ONLY
Bacterial vaginitis: NEGATIVE
Candida vaginitis: NEGATIVE
Chlamydia: NEGATIVE
Neisseria Gonorrhea: NEGATIVE
Trichomonas: NEGATIVE

## 2018-01-19 NOTE — Telephone Encounter (Signed)
Results are within normal range. Pt contacted and made aware. Verbalized understanding.   

## 2018-01-20 DIAGNOSIS — F29 Unspecified psychosis not due to a substance or known physiological condition: Secondary | ICD-10-CM | POA: Insufficient documentation

## 2018-01-20 LAB — URINE CULTURE

## 2018-01-21 ENCOUNTER — Telehealth (HOSPITAL_COMMUNITY): Payer: Self-pay

## 2018-01-21 NOTE — Telephone Encounter (Signed)
Urine culture positive for Klebsiella, this was treated with macrobid at urgent care visit which only has intermediate treatment to bacteria.  Keflex 500 mg BID x 5 days sent to pharmacy of record. No answer at this time. Voicemail left.

## 2018-01-25 ENCOUNTER — Telehealth (HOSPITAL_COMMUNITY): Payer: Self-pay

## 2018-01-25 MED ORDER — SULFAMETHOXAZOLE-TRIMETHOPRIM 800-160 MG PO TABS: 1.0000 | ORAL_TABLET | Freq: Two times a day (BID) | ORAL | 0 refills | Status: AC

## 2018-01-25 NOTE — Telephone Encounter (Signed)
RX changed to Bactrim due to patient allergies.

## 2018-06-12 ENCOUNTER — Other Ambulatory Visit: Payer: Self-pay

## 2018-06-12 ENCOUNTER — Encounter (HOSPITAL_COMMUNITY): Payer: Self-pay | Admitting: *Deleted

## 2018-06-12 ENCOUNTER — Ambulatory Visit (HOSPITAL_COMMUNITY)
Admission: EM | Admit: 2018-06-12 | Discharge: 2018-06-12 | Disposition: A | Discharge status: Home / Self Care | Payer: Medicaid Other | Attending: Family Medicine | Admitting: Family Medicine

## 2018-06-12 DIAGNOSIS — B9689 Other specified bacterial agents as the cause of diseases classified elsewhere: Secondary | ICD-10-CM

## 2018-06-12 DIAGNOSIS — Z3202 Encounter for pregnancy test, result negative: Secondary | ICD-10-CM

## 2018-06-12 DIAGNOSIS — N760 Acute vaginitis: Secondary | ICD-10-CM | POA: Diagnosis not present

## 2018-06-12 DIAGNOSIS — N3090 Cystitis, unspecified without hematuria: Secondary | ICD-10-CM | POA: Diagnosis not present

## 2018-06-12 LAB — POCT URINALYSIS DIP (DEVICE)
Bilirubin Urine: NEGATIVE
Glucose, UA: NEGATIVE mg/dL
Hgb urine dipstick: NEGATIVE
KETONES UR: NEGATIVE mg/dL
Leukocytes, UA: NEGATIVE
Nitrite: NEGATIVE
PH: 7 (ref 5.0–8.0)
PROTEIN: NEGATIVE mg/dL
Specific Gravity, Urine: 1.01 (ref 1.005–1.030)
Urobilinogen, UA: 0.2 mg/dL (ref 0.0–1.0)

## 2018-06-12 LAB — POCT PREGNANCY, URINE: Preg Test, Ur: NEGATIVE

## 2018-06-12 MED ORDER — METRONIDAZOLE 500 MG PO TABS: 500.0000 mg | ORAL_TABLET | Freq: Two times a day (BID) | ORAL | 0 refills | Status: DC

## 2018-06-12 MED ORDER — SULFAMETHOXAZOLE-TRIMETHOPRIM 800-160 MG PO TABS: 1.0000 | ORAL_TABLET | Freq: Two times a day (BID) | ORAL | 0 refills | Status: AC

## 2018-06-12 MED ORDER — FLUCONAZOLE 150 MG PO TABS: 150.0000 mg | ORAL_TABLET | Freq: Every day | ORAL | 0 refills | Status: DC

## 2018-06-12 NOTE — Discharge Instructions (Signed)
Stay hydrated.   Warning signs/symptoms: Uncontrollable nausea/vomiting, fevers, worsening symptoms despite treatment, confusion.  Give [place] around 2 business days to get culture back to you.  No alcohol while on medication.

## 2018-06-12 NOTE — ED Provider Notes (Signed)
  [facility]    CSN: [id number] Arrival date & time: 06/12/18  1515  Chief Complaint  Patient presents with  . Vaginal Discharge  . Dysuria    Mary Hogan is a 37 y.o. female here for possible UTI.  Duration: 2 weeks. Symptoms: Vaginal discharge that is foul-smelling, abdominal pain and dysuria Denies: hematuria, fever, nausea and vomiting Hx of recurrent UTI? No Denies new sexual partners. She was seen 2 weeks ago and urgent care and diagnosed with bacterial vaginosis and a urinary tract infection.  She was sent to the emergency department for suspected appendicitis and nobody ended up treating her.  ROS:  Constitutional: denies fever GU: As noted in HPI  Past Medical History:  Diagnosis Date  . Abnormal Pap smear   . Anxiety   . Depression   . Headache(784.0)   . IBS (irritable bowel syndrome)   . Scoliosis     BP 117/77   Pulse (!) 115   Temp 98.2 F (36.8 C) (Oral)   Resp 16   SpO2 99%  General: Awake, alert, appears stated age Heart: RRR Lungs: CTAB, normal respiratory effort, no accessory muscle usage Abd: BS+, soft, +ttp over suprapubic region, ND, no masses or organomegaly MSK: No CVA tenderness, neg Lloyd's sign Psych: Age appropriate judgment and insight  Final Clinical Impressions(s) / UC Diagnoses   Final diagnoses:  BV (bacterial vaginosis)  Cystitis   We will also call in Diflucan because the patient states she will frequently have a yeast infection with antibiotic use.  Follow-up with PCP if no improvement.  The patient voiced understanding and agreement the plan.   Discharge Instructions     Stay hydrated.   Warning signs/symptoms: Uncontrollable nausea/vomiting, fevers, worsening symptoms despite treatment, confusion.  Give [place] around 2 business days to get culture back to you.  No alcohol while on medication.    ED Prescriptions    Medication Sig Dispense Auth. Provider   metroNIDAZOLE (FLAGYL) 500 MG tablet Take  1 tablet (500 mg total) by mouth 2 (two) times daily. 14 tablet Shelda Pal, DO   sulfamethoxazole-trimethoprim (BACTRIM DS,SEPTRA DS) 800-160 MG tablet Take 1 tablet by mouth 2 (two) times daily for 3 days. 6 tablet Shelda Pal, DO   fluconazole (DIFLUCAN) 150 MG tablet Take 1 tablet (150 mg total) by mouth daily. 1 tablet Shelda Pal, DO     Controlled Substance Prescriptions [place] Controlled Substance Registry consulted? Not Applicable   Shelda Pal, [place] 06/12/18 1843

## 2018-06-12 NOTE — ED Triage Notes (Addendum)
C/O vaginal discharge x 2 wks with dysuria; was at an urgent care 9/19 for significant abd pain and was told BV and UTI, but told to go to ED; pt states was never treated for BV and UTI; states "a spot was found on my intestine; I have to get a PET scan".

## 2018-06-14 LAB — POCT PREGNANCY, URINE: Preg Test, Ur: NEGATIVE

## 2018-06-28 ENCOUNTER — Ambulatory Visit: Payer: Medicaid Other | Admitting: Podiatry

## 2018-06-30 ENCOUNTER — Ambulatory Visit: Payer: Medicaid Other | Admitting: Podiatry

## 2018-07-06 ENCOUNTER — Encounter (HOSPITAL_COMMUNITY): Payer: Self-pay | Admitting: Emergency Medicine

## 2018-07-06 ENCOUNTER — Ambulatory Visit (HOSPITAL_COMMUNITY)
Admission: EM | Admit: 2018-07-06 | Discharge: 2018-07-06 | Disposition: A | Discharge status: Home / Self Care | Payer: Medicaid Other

## 2018-07-06 ENCOUNTER — Ambulatory Visit (HOSPITAL_COMMUNITY)
Admission: EM | Admit: 2018-07-06 | Discharge: 2018-07-06 | Disposition: A | Discharge status: Home / Self Care | Payer: Medicaid Other | Attending: Family Medicine | Admitting: Family Medicine

## 2018-07-06 DIAGNOSIS — R21 Rash and other nonspecific skin eruption: Secondary | ICD-10-CM | POA: Diagnosis not present

## 2018-07-06 MED ORDER — PREDNISONE 20 MG PO TABS: 40.0000 mg | ORAL_TABLET | Freq: Every day | ORAL | 0 refills | Status: AC

## 2018-07-06 MED ORDER — CETIRIZINE HCL 10 MG PO CAPS: 10.0000 mg | ORAL_CAPSULE | Freq: Every day | ORAL | 0 refills | Status: DC

## 2018-07-06 MED ORDER — TRIAMCINOLONE ACETONIDE 0.025 % EX CREA: 1.0000 "application " | TOPICAL_CREAM | Freq: Two times a day (BID) | CUTANEOUS | 0 refills | Status: DC

## 2018-07-06 NOTE — ED Triage Notes (Signed)
PT has a rash on face that started Saturday. PT reports the skin on her back feels like it's on fire

## 2018-07-06 NOTE — Discharge Instructions (Addendum)
May try triamcinolone cream twice daily and thin amount on face, if not helping may use oral prednisone as an alternative, 40 mg daily with food for the next 5 days  Please also use antihistamines-Zyrtec or other allergy pill during the day, may add in Benadryl at nighttime  Follow-up if rash spreading, not improving with the above treatment or worsening

## 2018-07-07 ENCOUNTER — Ambulatory Visit: Payer: Medicaid Other | Admitting: Podiatry

## 2018-07-07 DIAGNOSIS — B351 Tinea unguium: Secondary | ICD-10-CM | POA: Diagnosis not present

## 2018-07-07 DIAGNOSIS — M419 Scoliosis, unspecified: Secondary | ICD-10-CM | POA: Insufficient documentation

## 2018-07-07 DIAGNOSIS — L309 Dermatitis, unspecified: Secondary | ICD-10-CM

## 2018-07-07 DIAGNOSIS — F419 Anxiety disorder, unspecified: Secondary | ICD-10-CM | POA: Insufficient documentation

## 2018-07-07 DIAGNOSIS — Z5189 Encounter for other specified aftercare: Secondary | ICD-10-CM | POA: Insufficient documentation

## 2018-07-07 DIAGNOSIS — L600 Ingrowing nail: Secondary | ICD-10-CM

## 2018-07-07 MED ORDER — NEOMYCIN-POLYMYXIN-HC 3.5-10000-1 OT SOLN: OTIC | 1 refills | Status: DC

## 2018-07-07 MED ORDER — HYDROCODONE-ACETAMINOPHEN 10-325 MG PO TABS: 1.0000 | ORAL_TABLET | Freq: Three times a day (TID) | ORAL | 0 refills | Status: DC | PRN

## 2018-07-07 NOTE — Patient Instructions (Signed)

## 2018-07-07 NOTE — ED Provider Notes (Signed)
[facility]    CSN: [id number] Arrival date & time: 07/06/18  1922     History   Chief Complaint Chief Complaint  Patient presents with  . Rash    HPI Mary Hogan is a 37 y.o. female no contributing past medical history presenting today for evaluation of a rash.  Patient states that over yesterday and today she has developed an erythematous itchy rash mainly to her face.  Patient states that she has had this previously, but was not as bad.  She is unsure of any new exposures.  Denies any new soaps, lotions, detergents, hygiene products, make-up.  She is taking Benadryl with minimal relief.  Denies shortness of breath or difficulty breathing, but has some discomfort in her throat.  Denies rash elsewhere on body, but has recently noticed a lesion to her central back as well.  No known exposures to woods/poison ivy.  HPI  Past Medical History:  Diagnosis Date  . Abnormal Pap smear   . Anxiety   . Depression   . Headache(784.0)   . IBS (irritable bowel syndrome)   . Scoliosis     Patient Active Problem List   Diagnosis Date Noted  . Bipolar disorder current episode depressed ([place]) 12/06/2011    Class: Acute  . TOBACCO USER 05/26/2009  . AMENORRHEA, SECONDARY 01/26/2007  . DEPRESSIVE DISORDER, NOS 11/05/2006  . RESTLESS LEGS SYNDROME 11/05/2006  . PAPANICOLAOU SMEAR, ABNORMAL 11/05/2006  . PAPANICOLAOU SMEAR, ABNORMAL 11/05/2006    Past Surgical History:  Procedure Laterality Date  . BREAST ENHANCEMENT SURGERY  2008  . BREAST SURGERY    . DILATION AND CURETTAGE OF UTERUS    . ENDOMETRIAL ABLATION    . LAPAROSCOPIC OVARIAN CYSTECTOMY  2012  . TUBAL LIGATION      OB History    Gravida  9   Para  4   Term  2   Preterm  2   AB  5   Living  4     SAB  2   TAB  2   Ectopic  1   Multiple      Live Births               Home Medications    Prior to Admission medications   Medication Sig Start Date End Date Taking? Authorizing  Provider  clonazePAM (KLONOPIN) 0.5 MG tablet Take 0.5 mg by mouth at bedtime.   Yes [provider]  fluconazole (DIFLUCAN) 150 MG tablet Take 1 tablet (150 mg total) by mouth daily. 06/12/18  Yes Shelda Pal, DO  GABAPENTIN PO Take by mouth.   Yes [provider]  metroNIDAZOLE (FLAGYL) 500 MG tablet Take 1 tablet (500 mg total) by mouth 2 (two) times daily. 06/12/18  Yes Shelda Pal, DO  Cetirizine HCl 10 MG CAPS Take 1 capsule (10 mg total) by mouth daily for 10 days. 07/06/18 07/16/18  Shriley Joffe C, PA-C  predniSONE (DELTASONE) 20 MG tablet Take 2 tablets (40 mg total) by mouth daily for 5 days. 07/06/18 07/11/18  Shivangi Lutz C, PA-C  triamcinolone (KENALOG) 0.025 % cream Apply 1 application topically 2 (two) times daily. 07/06/18   Matyas Baisley, Elesa Hacker, PA-C    Family History Family History  Problem Relation Age of Onset  . Alzheimer's disease Mother   . Hypertension Mother   . Diabetes Mother   . Anxiety disorder Mother   . Depression Mother   . Post-traumatic stress disorder Mother   .  Anesthesia problems Neg Hx   . Hypotension Neg Hx   . Malignant hyperthermia Neg Hx   . Pseudochol deficiency Neg Hx     Social History Social History   Tobacco Use  . Smoking status: Current Every Day Smoker    Packs/day: 1.00  . Smokeless tobacco: Never Used  Substance Use Topics  . Alcohol use: No  . Drug use: Never     Allergies   Ketorolac tromethamine; Amoxicillin; Penicillins; and Tramadol   Review of Systems Review of Systems  Constitutional: Negative for fatigue and fever.  HENT: Positive for sore throat. Negative for mouth sores.   Eyes: Negative for visual disturbance.  Respiratory: Negative for cough and shortness of breath.   Cardiovascular: Negative for chest pain.  Gastrointestinal: Negative for abdominal pain, nausea and vomiting.  Musculoskeletal: Negative for arthralgias and joint swelling.  Skin: Positive for color  change and rash. Negative for wound.  Neurological: Negative for dizziness, weakness, light-headedness and headaches.     Physical Exam Triage Vital Signs ED Triage Vitals [07/06/18 1954]  Enc Vitals Group     BP 135/70     Pulse Rate 73     Resp 16     Temp 98.5 F (36.9 C)     Temp Source Temporal     SpO2 100 %     Weight      Height      Head Circumference      Peak Flow      Pain Score 2     Pain Loc      Pain Edu?      Excl. in [place]?    No data found.  Updated Vital Signs BP 135/70   Pulse 73   Temp 98.5 F (36.9 C) (Temporal)   Resp 16   SpO2 100%   Visual Acuity Right Eye Distance:   Left Eye Distance:   Bilateral Distance:    Right Eye Near:   Left Eye Near:    Bilateral Near:     Physical Exam  Constitutional: She appears well-developed and well-nourished. No distress.  HENT:  Head: Normocephalic and atraumatic.  Mouth/Throat: Oropharynx is clear and moist.  Oral mucosa pink and moist, no tonsillar enlargement or exudate. Posterior pharynx patent and nonerythematous, no uvula deviation or swelling. Normal phonation.  Eyes: Pupils are equal, round, and reactive to light. Conjunctivae and EOM are normal.  Neck: Neck supple.  Cardiovascular: Normal rate and regular rhythm.  No murmur heard. Pulmonary/Chest: Effort normal and breath sounds normal. No respiratory distress.  Breathing comfortably at rest, CTABL, no wheezing, rales or other adventitious sounds auscultated  Abdominal: Soft. There is no tenderness.  Musculoskeletal: She exhibits no edema.  Neurological: She is alert.  Skin: Skin is warm and dry.  Face with erythematous papular rash to nasal bridge extending around mouth/perioral area and to chin Minimal involvement of neck and chest  Central lumbar back with small abrasion appears to be scabbing, and no surrounding erythema or warmth  Psychiatric: She has a normal mood and affect.  Nursing note and vitals reviewed.    UC Treatments  / Results  Labs (all labs ordered are listed, but only abnormal results are displayed) Labs Reviewed - No data to display  EKG None  Radiology No results found.  Procedures Procedures (including critical care time)  Medications Ordered in UC Medications - No data to display  Initial Impression / Assessment and Plan / UC Course  I have reviewed the  triage vital signs and the nursing notes.  Pertinent labs & imaging results that were available during my care of the patient were reviewed by me and considered in my medical decision making (see chart for details).     Patient with a pruritic rash to face.  Airway patent and intact.  Possible allergic reaction versus contact dermatitis versus acneform rash.  Patient has had a self-limiting rash previously and believe this to be an allergic reaction.  Will treat as such we will provide triamcinolone 0.025 to apply to face.  We will try this initially, if not having improvement with cream may try prednisone orally 40 mg daily.  Also continue use of antihistamine Zyrtec/other allergy pill during the day and Benadryl at nighttime.Discussed strict return precautions. Patient verbalized understanding and is agreeable with plan.  Final Clinical Impressions(s) / UC Diagnoses   Final diagnoses:  Rash and nonspecific skin eruption     Discharge Instructions     May try triamcinolone cream twice daily and thin amount on face, if not helping may use oral prednisone as an alternative, 40 mg daily with food for the next 5 days  Please also use antihistamines-Zyrtec or other allergy pill during the day, may add in Benadryl at nighttime  Follow-up if rash spreading, not improving with the above treatment or worsening   ED Prescriptions    Medication Sig Dispense Auth. Provider   triamcinolone (KENALOG) 0.025 % cream Apply 1 application topically 2 (two) times daily. 30 g Griselda Bramblett C, PA-C   predniSONE (DELTASONE) 20 MG tablet Take 2  tablets (40 mg total) by mouth daily for 5 days. 10 tablet Guhan Bruington C, PA-C   Cetirizine HCl 10 MG CAPS Take 1 capsule (10 mg total) by mouth daily for 10 days. 10 capsule Milferd Ansell C, PA-C     Controlled Substance Prescriptions [place] Controlled Substance Registry consulted? Not Applicable   Janith Lima, [place] 07/07/18 0945

## 2018-07-07 NOTE — Progress Notes (Signed)
Subjective:   Patient ID: Mary Hogan, female   DOB: 37 y.o.   MRN: [id number]   HPI Patient presents with severe thickness of the fourth nail left with dystrophic changes pain and inability to cut.  States is been present for several years and is gradually gotten worse and also has discoloration of the other nails but nowhere near to the degree of this 1.  Patient does smoke a pack per day and would like to be active   Review of Systems  All other systems reviewed and are negative.       Objective:  Physical Exam  Constitutional: She appears well-developed and well-nourished.  Cardiovascular: Intact distal pulses.  Pulmonary/Chest: Effort normal.  Musculoskeletal: Normal range of motion.  Neurological: She is alert.  Skin: Skin is warm.  Nursing note and vitals reviewed.   Neurovascular status found to be intact muscle strength was adequate range of motion within normal limits with patient noted to have severely thickened dystrophic painful fourth nail left and mild disease of the remaining nails but not painful or damage.  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  Patient wants surgery and I allowed her to go over consent form signed consent form and I infiltrated the fourth toe 60 mg like Marcaine mixture sterile prep applied to the toe remove the nail exposed matrix and applied phenol 5 applications 30 seconds followed by alcohol lavage and sterile dressing.  Gave instructions on soaks and leave dressing on 24 hours but to take it off earlier if it should start throbbing and encouraged her to call us with any questions concerns she may have

## 2018-07-08 ENCOUNTER — Telehealth: Payer: Self-pay | Admitting: Podiatry

## 2018-07-08 ENCOUNTER — Ambulatory Visit (INDEPENDENT_AMBULATORY_CARE_PROVIDER_SITE_OTHER): Payer: Medicaid Other | Admitting: Podiatry

## 2018-07-08 ENCOUNTER — Encounter: Payer: Self-pay | Admitting: Podiatry

## 2018-07-08 DIAGNOSIS — L03032 Cellulitis of left toe: Secondary | ICD-10-CM

## 2018-07-08 NOTE — Telephone Encounter (Signed)
I had a toenail removed yesterday and I'm still in a lot of pain to where I cannot sleep at night. I didn't know if something else could be going on. If you could call me back at [phone].

## 2018-07-08 NOTE — Telephone Encounter (Signed)
I called pt and asked her to describe the pain. Pt states she has burning pain in her feet and shooting pain in the toes. I asked pt if she had begun the soaks and she states no. I told pt she should begin the soaks and the removal of the dressing would help with the discomfort also. I told pt she could also use OTC ibuprofen if she tolerated for the discomfort. Pt states she is having burning in her feet that keeps her up all night. I told pt that may be related to another problem and she needed another appt to be evaluated. Transferred pt to schedulers.

## 2018-07-08 NOTE — Telephone Encounter (Signed)
Pt called in stating she is in severe pain after having toenail removed yesterday, 10/30. Cannot sleep from pain. Pt thinks something else might be wrong since she is in so much pain. Please call patient.

## 2018-07-08 NOTE — Progress Notes (Signed)
Subjective:   Patient ID: Mary Hogan, female   DOB: 37 y.o.   MRN: [id number]   HPI Patient presents stating her fourth toe left is really been bothering her and she left the dressing on all night and left in until this afternoon not following the directions I have given her   ROS      Objective:  Physical Exam  Neurovascular status intact with patient's fourth digit nailbed showing fresh bleeding no significant indications of infection with localized irritation of the bed with no proximal edema erythema drainage noted.     Assessment:  Probable trauma to the left fourth toe with patient being noncompliant and leaving her dressing on for longer than we have instructed her to do it she admits that she should have taken it off as we had instructed her     Plan:  Dressing is removed at this time which gave her relief of her symptoms and advised on soaks open toed shoes and possible antibiotics if any redness or drainage were to occur.  Since feels so much better now we will just monitor it I gave her strict instructions of any changes were to occur she is to come in immediately but it appears it was strictly due to the tight dressing that she did not remove as we had instructed her

## 2018-07-08 NOTE — Telephone Encounter (Signed)
Completed message in 1:16pm telephone call.

## 2018-08-27 ENCOUNTER — Encounter: Payer: Self-pay | Admitting: Gastroenterology

## 2018-09-22 ENCOUNTER — Ambulatory Visit: Payer: Medicaid Other | Admitting: Gastroenterology

## 2018-09-22 ENCOUNTER — Other Ambulatory Visit (INDEPENDENT_AMBULATORY_CARE_PROVIDER_SITE_OTHER): Payer: Medicaid Other

## 2018-09-22 ENCOUNTER — Encounter (INDEPENDENT_AMBULATORY_CARE_PROVIDER_SITE_OTHER): Payer: Self-pay

## 2018-09-22 ENCOUNTER — Encounter: Payer: Self-pay | Admitting: Gastroenterology

## 2018-09-22 VITALS — BP 100/76 | HR 72 | Ht 65.0 in | Wt 148.2 lb

## 2018-09-22 DIAGNOSIS — R109 Unspecified abdominal pain: Secondary | ICD-10-CM

## 2018-09-22 DIAGNOSIS — K5909 Other constipation: Secondary | ICD-10-CM | POA: Diagnosis not present

## 2018-09-22 DIAGNOSIS — R631 Polydipsia: Secondary | ICD-10-CM | POA: Diagnosis not present

## 2018-09-22 DIAGNOSIS — R194 Change in bowel habit: Secondary | ICD-10-CM | POA: Diagnosis not present

## 2018-09-22 DIAGNOSIS — R935 Abnormal findings on diagnostic imaging of other abdominal regions, including retroperitoneum: Secondary | ICD-10-CM

## 2018-09-22 LAB — LIPASE: Lipase: 15 U/L (ref 11.0–59.0)

## 2018-09-22 LAB — HEPATIC FUNCTION PANEL
ALK PHOS: 48 U/L (ref 39–117)
ALT: 8 U/L (ref 0–35)
AST: 12 U/L (ref 0–37)
Albumin: 4.3 g/dL (ref 3.5–5.2)
BILIRUBIN DIRECT: 0.1 mg/dL (ref 0.0–0.3)
BILIRUBIN TOTAL: 0.4 mg/dL (ref 0.2–1.2)
Total Protein: 6.8 g/dL (ref 6.0–8.3)

## 2018-09-22 LAB — BASIC METABOLIC PANEL
BUN: 9 mg/dL (ref 6–23)
CHLORIDE: 103 meq/L (ref 96–112)
CO2: 31 meq/L (ref 19–32)
CREATININE: 0.93 mg/dL (ref 0.40–1.20)
Calcium: 9.1 mg/dL (ref 8.4–10.5)
GFR: 71.24 mL/min (ref >60.00)
GLUCOSE: 88 mg/dL (ref 70–99)
Potassium: 3.9 mEq/L (ref 3.5–5.1)
Sodium: 138 mEq/L (ref 135–145)

## 2018-09-22 LAB — CBC
HEMATOCRIT: 40.1 % (ref 36.0–46.0)
Hemoglobin: 13.3 g/dL (ref 12.0–15.0)
MCHC: 33.2 g/dL (ref 30.0–36.0)
MCV: 93.1 fl (ref 78.0–100.0)
Platelets: 197 10*3/uL (ref 150.0–400.0)
RBC: 4.3 Mil/uL (ref 3.87–5.11)
RDW: 13 % (ref 11.5–15.5)
WBC: 7.5 10*3/uL (ref 4.0–10.5)

## 2018-09-22 LAB — AMYLASE: Amylase: 24 U/L — ABNORMAL LOW (ref 27–131)

## 2018-09-22 MED ORDER — SUPREP BOWEL PREP KIT 17.5-3.13-1.6 GM/177ML PO SOLN: 1.0000 | ORAL | 0 refills | Status: DC

## 2018-09-22 MED ORDER — ONDANSETRON HCL 8 MG PO TABS: 8.0000 mg | ORAL_TABLET | Freq: Four times a day (QID) | ORAL | 2 refills | Status: DC | PRN

## 2018-09-22 MED ORDER — OMEPRAZOLE 40 MG PO CPDR: 40.0000 mg | DELAYED_RELEASE_CAPSULE | Freq: Two times a day (BID) | ORAL | 2 refills | Status: DC

## 2018-09-22 NOTE — Patient Instructions (Addendum)
You have been scheduled for a colonoscopy/EGD. Please follow written instructions given to you at your visit today.  Please pick up your prep supplies at the pharmacy within the next 1-3 days. If you use inhalers (even only as needed), please bring them with you on the day of your procedure. Your physician has requested that you go to www.[email or web address].com and enter the access code given to you at your visit today. This web site gives a general overview about your procedure. However, you should still follow specific instructions given to you by our office regarding your preparation for the procedure.  We have sent the following medications to your pharmacy for you to pick up at your convenience:  Start Miralax 1 capful daily for 1 week then increase to 2 capfuls daily  Start colace 100mg  twice daily  Start fibercon once daily  Thank you for entrusting me with your care and choosing [facility].  Dr Rush Landmark

## 2018-09-22 NOTE — Progress Notes (Signed)
[place] VISIT   [facility], [facility] [place] [address] [phone]  Referring [facility], [facility] [place], [place] [address] [phone]  Patient Profile: Mary Hogan is a 38 y.o. female with a pmh significant for reported irritable bowel syndrome, scoliosis, headaches, anxiety/MDD, prior ruptured ovarian cyst, UTIs.  The patient presents to the [facility] for an evaluation and management of problem(s) noted below:  Problem List 1. Abdominal pain, unspecified abdominal location   2. Abnormal CT of the abdomen   3. Other constipation   4. Change in bowel habits     History of Present Illness: This is the patient's first visit to the outpatient of our GI clinic.  She has never seen a gastroenterologist previously.  The patient describes issues starting in the last summer when she developed pain in the right periumbilical region that radiated towards the right side and right flank.  Over the course of the coming weeks and months the patient began to experience more issues and persistent recurrent pain.  She has had nausea that has now been daily requiring antiemetics to help her.  She previously had used Dramamine and Benadryl without much effect but is currently using Zofran and she is able to tolerate eating and drinking when she takes this on a daily basis.  The patient has had an approximately 10 to 15 pound weight loss over the course of the last few months since her pain began.  She has a decreased appetite but would like to eat if she could.  She does not vomit.  Pain comes and goes.  She has had increased thirst but denies overt polydipsia.  She describes a family history of a member having stomach cancer versus colon cancer with metastatic disease to the pancreas.  Patient herself has had issues with constipation for  years however over the course the last 4 months this is worsened.  She has used MiraLAX in the past to help she does not take that on a daily basis.  She thinks she stays hydrated.  To help with pain she has been taking ibuprofen 3 to 4 tablets/day up to 3-4 times per day.  She has an issue with tramadol and has hives from this and as such cannot take this medication.  Her stool has been darker in quality as a very dark brown or possibly black but has never been liquid melena.  She denies overt hematochezia.  She is never had an upper or lower endoscopy.  GI Review of Systems Positive as above Negative for dysphagia, odynophagia, urgency, tenesmus  Review of Systems General: Denies fevers/chills HEENT: Denies oral lesions Cardiovascular: Denies chest pain Pulmonary: Denies shortness of breath Gastroenterological: See HPI Genitourinary: Denies darkened urine Hematological: Denies easy bruising/bleeding Endocrine: Denies temperature intolerance Dermatological: Denies jaundice Psychological: Mood is anxious and scared   Medications Current Outpatient Medications  Medication Sig Dispense Refill  . Acetaminophen-Caffeine 500-65 MG TABS Take 1 tablet by mouth as needed.    . clonazePAM (KLONOPIN) 0.5 MG tablet Take 0.5 mg by mouth at bedtime.    . gabapentin (NEURONTIN) 300 MG capsule Take 1 capsule by mouth as needed.    Marland Kitchen ibuprofen (ADVIL,MOTRIN) 200 MG tablet Take 200 mg by mouth as needed.    . modafinil (PROVIGIL) 100 MG tablet Take 1/2 to 1 tab in the morning and 1/2 to 1 at noon (do not  request PA)    . ondansetron (ZOFRAN) 8 MG tablet Take 1 tablet by mouth 3 (three) times daily.    Marland Kitchen omeprazole (PRILOSEC) 40 MG capsule Take 1 capsule (40 mg total) by mouth 2 (two) times daily. 60 capsule 2  . ondansetron (ZOFRAN) 8 MG tablet Take 1 tablet (8 mg total) by mouth every 6 (six) hours as needed for nausea or vomiting. 30 tablet 2  . SUPREP BOWEL PREP KIT 17.5-3.13-1.6 GM/177ML SOLN Take 1  kit by mouth as directed. 2 Bottle 0   No current facility-administered medications for this visit.     Allergies Allergies  Allergen Reactions  . Ketorolac Tromethamine Other (See Comments)    Stomach burning in stomach.  . Amoxicillin Hives      . Penicillins Hives  . Tramadol Hives    Histories Past Medical History:  Diagnosis Date  . Abnormal Pap smear   . Anxiety   . Depression   . Headache(784.0)   . IBS (irritable bowel syndrome)   . Scoliosis    Past Surgical History:  Procedure Laterality Date  . BREAST ENHANCEMENT SURGERY  2008  . DILATION AND CURETTAGE OF UTERUS    . ENDOMETRIAL ABLATION    . LAPAROSCOPIC OVARIAN CYSTECTOMY  2012  . TUBAL LIGATION     Social History   Socioeconomic History  . Marital status: Single    Spouse name: Not on file  . Number of children: 4  . Years of education: Not on file  . Highest education level: Not on file  Occupational History  . Occupation: Psychologist, prison and probation services Needs  . Financial resource strain: Not on file  . Food insecurity:    Worry: Not on file    Inability: Not on file  . Transportation needs:    Medical: Not on file    Non-medical: Not on file  Tobacco Use  . Smoking status: Current Every Day Smoker    Packs/day: 1.00  . Smokeless tobacco: Never Used  Substance and Sexual Activity  . Alcohol use: Yes    Comment: occasional  . Drug use: Never  . Sexual activity: Yes    Birth control/protection: Surgical  Lifestyle  . Physical activity:    Days per week: Not on file    Minutes per session: Not on file  . Stress: Not on file  Relationships  . Social connections:    Talks on phone: Not on file    Gets together: Not on file    Attends religious service: Not on file    Active member of club or organization: Not on file    Attends meetings of clubs or organizations: Not on file    Relationship status: Not on file  . Intimate partner violence:    Fear of current or ex partner: Not on file     Emotionally abused: Not on file    Physically abused: Not on file    Forced sexual activity: Not on file  Other Topics Concern  . Not on file  Social History Narrative  . Not on file   Family History  Problem Relation Age of Onset  . Alzheimer's disease Mother   . Hypertension Mother   . Diabetes Mother   . Anxiety disorder Mother   . Depression Mother   . Post-traumatic stress disorder Mother   . Irritable bowel syndrome Mother   . Parkinson's disease Mother   . Diabetes Father   . Liver disease Maternal Grandfather   . Pancreatic  cancer Maternal Uncle   . Stomach cancer Maternal Uncle   . Diabetes Maternal Uncle   . Colon cancer Maternal Uncle   . Anesthesia problems Neg Hx   . Hypotension Neg Hx   . Malignant hyperthermia Neg Hx   . Pseudochol deficiency Neg Hx   . Esophageal cancer Neg Hx    I have reviewed her medical, social, and family history in detail and updated the electronic medical record as necessary.    PHYSICAL EXAMINATION  BP 100/76 (BP Location: Left Arm, Patient Position: Sitting, Cuff Size: Normal)   Pulse 72   Ht '5\' 5"'$  (1.651 m) Comment: height measured without shoes  Wt 148 lb 4 oz (67.2 kg)   BMI 24.67 kg/m  Wt Readings from Last 3 Encounters:  09/22/18 148 lb 4 oz (67.2 kg)  12/07/17 164 lb (74.4 kg)  03/22/17 140 lb (63.5 kg)  GEN: NAD, appears stated age, doesn't appear chronically ill PSYCH: Cooperative, without pressured speech EYE: Conjunctivae pink, sclerae anicteric ENT: MMM, without oral ulcers, no erythema or exudates noted NECK: Supple CV: RR without R/Gs  RESP: CTAB posteriorly, without wheezing GI: NABS, soft, mild tenderness to palpation in the right periumbilical region and mildly in the right upper quadrant, nondistended, without hepatosplenomegaly appreciated MSK/EXT: No lower extremity edema SKIN: No jaundice NEURO:  Alert & Oriented x 3, no focal deficits   REVIEW OF DATA  I reviewed the following data at the time of  this encounter:  GI Procedures and Studies  No relevant studies to review  Laboratory Studies  Reviewed in care everywhere and epic  Imaging Studies  September 2019 CT abdomen pelvis and care everywhere FINDINGS:  - Lower Thorax: Partially visualized bilateral breast prostheses. - Liver: Normal contour and attenuation.No focal lesions.The portal veins are patent.  - Biliary Tree and Gallbladder: No biliary ductal dilatation.The gallbladder is unremarkable.  - Pancreas: Normal.  - Spleen: Normal. - Adrenal Glands: Normal.  - Kidneys and Bladder: No hydronephrosis. No focal renal lesions. The bladder is unremarkable. - Gastrointestinal Tract: No bowel obstruction or bowel wall thickening. Normal appendix. - Peritoneal Cavity and Retroperitoneum: Trace free fluid in the pelvis which is likely physiologic.No free intraperitoneal air. There is a small amount of a more solid soft tissue anterior to the third portion of duodenum measuring approximately 2.2 x  1.8 cm (series 2, image 62). - Lymph Nodes: No retroperitoneal, mesenteric or pelvic lymphadenopathy. - Vasculature: Normal caliber abdominal aorta. Patent proximal aortic branch vessels. - Reproductive: Small corpus luteum within the left ovary. - Musculoskeletal: No aggressive appearing osseous lesions. - Miscellaneous: N/A  November 2018 CT abdomen pelvis and care everywhere FINDINGS:  VISUALIZED LOWER THORAX: Mild dependent edema. Normal heart size. SOLID VISCERA: Liver: Normal. Gallbladder: Normal Pancreas: Normal. Adrenal glands: Normal. Spleen: Normal. Kidneys: Normal kidneys. Ureters are normal where visible. Unremarkable urinary bladder. GI: No bowel obstruction. Normal appendix.  Large amount of stool in the right and transverse portions of the colon. PERITONEAL CAVITY/RETROPERITONEUM: A small amount of free fluid is present in the cul-de-sac. A 25 mm cystic structure in the right side of the pelvis  likely represents an ovarian cyst which may have ruptured. No pneumoperitoneum. No lymphadenopathy. Vessels are normal in caliber. PELVIS: As above MUSCULOSKELETAL: No acute or destructive osseous processes.  July 2017 CT abdomen pelvis and care everywhere FINDINGS: VISUALIZED LOWER THORAX: No acute abnormalities. Partially imaged bilateral breast implants. UROLOGIC: Kidneys/ureters: No renal calculi. No hydronephrosis. Urinary bladder:Intact. SOLID VISCERA: Liver: Intact. Gallbladder:  Contracted. No radiopaque gallstones or pericholecystic fluid. Adrenal glands: Intact. Pancreas: Small amount of fluid attenuation anterior to the and proximal pancreatic body.  Spleen: Intact. GI: No bowel obstruction. Colonic diverticulosis without CT evidence for acute diverticulitis. Normal appendix.  PERITONEAL CAVITY/RETROPERITONEUM: No free fluid. No pneumoperitoneum. No lymphadenopathy. PELVIS: Nonspecific wall thickening of the urinary bladder with mild surrounding inflammatory change. MUSCULOSKELETAL: No acute or destructive osseous processes MISC: Minimal amount calcified aortic plaque within the distal infrarenal abdominal aorta. Tiny fat-containing umbilical hernia.   ASSESSMENT  Mary Hogan is a 38 y.o. female with a pmh significant for reported irritable bowel syndrome, scoliosis, headaches, anxiety/MDD, prior ruptured ovarian cyst, UTIs.  The patient is seen today for evaluation and management of:  1. Abdominal pain, unspecified abdominal location   2. Abnormal CT of the abdomen   3. Other constipation   4. Change in bowel habits    This is a hemodynamically stable patient who presents for evaluation of above issues.  There is no doubt that she is experiencing some etiology for her change in appetite and weight loss.  At this point in time most of her records are outside of this area we will need to review and monitor her weight closely.  I think it that we need to review  the CT scan which is reporting a lesion around the region of D3 though it is not clear that this is a intraluminal lesion but may be something outside of the area.  I would plan to perform an upper endoscopy as a diagnostic work-up as well as a colonoscopy as a diagnostic work-up for her weight loss as well as her other symptoms as described above.  I would like to start the patient on PPI therapy because she is taking significant amounts of nonsteroidals.  Ideally I like her to stop them but is not clear that she will be able to stop them.  In my practice I do not offer opioid therapy as a means of pain medications and so that would have to be discussed with her primary care provider as necessary if that were to be to determine the medication that would help the patient more so.  I want to rule out etiologies of pancreas problems as well as any other issues from that standpoint.  We rule out H. pylori and peptic ulcer disease.  We would want to rule out no obstructing lesions as an etiology for her progressive changes in her bowel habits and worsening constipation as well.  We may consider the role of repeat imaging here to further characterize things.  Not sure that an endoscopic ultrasound will be helpful for now.  She describes having recent labs done we will try and get those from her primary care provider.  I would like to try and optimize her stool output though is not clear to me that this is constipation related pain we need to try and improve things.  All patient questions were answered, to the best of my ability, and the patient agrees to the aforementioned plan of action with follow-up as indicated.   PLAN  Labs as outlined below Start PPI 40 mg twice daily with plan for at least an 8-week period of trial Zofran to be refilled Start Colace 100 mg twice daily Start fiber supplementation once daily to twice daily Start MiraLAX 1 capful daily for 1 week and then increase to 2 capfuls daily over the  course the next 2 to 3 weeks Diagnostic upper and  lower endoscopy to be performed We will get permission and try and obtain CDs of her most recent CT scan through the course the last 2 years to try and review those here formally   Orders Placed This Encounter  Procedures  . CBC  . Basic metabolic panel  . Amylase  . Lipase  . Hepatic function panel  . Ambulatory referral to Gastroenterology    New Prescriptions   OMEPRAZOLE (PRILOSEC) 40 MG CAPSULE    Take 1 capsule (40 mg total) by mouth 2 (two) times daily.   ONDANSETRON (ZOFRAN) 8 MG TABLET    Take 1 tablet (8 mg total) by mouth every 6 (six) hours as needed for nausea or vomiting.   SUPREP BOWEL PREP KIT 17.5-3.13-1.6 GM/177ML SOLN    Take 1 kit by mouth as directed.   Modified Medications   No medications on file    Planned Follow Up: No follow-ups on file.   Justice Britain, MD [facility] Office # [phone]

## 2018-09-24 ENCOUNTER — Encounter: Payer: Self-pay | Admitting: Gastroenterology

## 2018-09-24 DIAGNOSIS — R109 Unspecified abdominal pain: Secondary | ICD-10-CM | POA: Insufficient documentation

## 2018-09-24 DIAGNOSIS — R194 Change in bowel habit: Secondary | ICD-10-CM | POA: Insufficient documentation

## 2018-09-24 DIAGNOSIS — R935 Abnormal findings on diagnostic imaging of other abdominal regions, including retroperitoneum: Secondary | ICD-10-CM | POA: Insufficient documentation

## 2018-09-24 DIAGNOSIS — K5909 Other constipation: Secondary | ICD-10-CM | POA: Insufficient documentation

## 2018-09-24 NOTE — Progress Notes (Signed)
Review of outside records obtained  September 19 urinalysis Moderate blood, negative protein, negative nitrites, negative leukoesterase  Hemoglobin 13.5 Hematocrit 41.7 MCV 96 Platelets 188 White blood cell count 11.7 Sodium 139 Potassium 3.8 BUN 7 Creatinine 0.98 Calcium 9.5 Albumin 4.3 T bili 0.39 Alkaline phosphatase 58 AST/ALT 11/17  Beta-hCG negative urine  CT scans noted are in care everywhere but CT is pending being mailed so that it can be uploaded\  Justice Britain, MD [facility] Office # [phone]

## 2018-10-08 ENCOUNTER — Encounter: Payer: Self-pay | Admitting: Gastroenterology

## 2018-10-08 NOTE — Progress Notes (Signed)
Review of outside records These records will be scanned into the chart. April 2019 Urine labs as a tox screen all negative Hemoglobin A1c 5.4%  December 2019 labs TSH 1.18 Free T4 1.19 Thyroglobulin antibody less than 1.0 WBC 6.8 Hemoglobin 12.8 Hematocrit 39.3 MCV 94 Sodium 139 Potassium 4.2 Calcium 8.9 BUN 11 Creatinine 0.92 Glucose 88 Total cholesterol 116 Triglycerides 86 HDL 45 LDL 54 Hemoglobin A1c 5.6 Vitamin D 2525 (low)  These are the entirety of the records from [facility].

## 2018-10-12 ENCOUNTER — Inpatient Hospital Stay
Admission: RE | Admit: 2018-10-12 | Discharge: 2018-10-12 | Disposition: A | Discharge status: Home / Self Care | Payer: Self-pay | Source: Ambulatory Visit | Attending: Gastroenterology | Admitting: Gastroenterology

## 2018-10-12 ENCOUNTER — Other Ambulatory Visit (HOSPITAL_COMMUNITY): Payer: Self-pay | Admitting: Gastroenterology

## 2018-10-12 DIAGNOSIS — C801 Malignant (primary) neoplasm, unspecified: Secondary | ICD-10-CM

## 2018-10-13 ENCOUNTER — Emergency Department (HOSPITAL_COMMUNITY)
Admission: EM | Admit: 2018-10-13 | Discharge: 2018-10-14 | Disposition: A | Discharge status: Home / Self Care | Payer: Medicaid Other | Attending: Emergency Medicine | Admitting: Emergency Medicine

## 2018-10-13 ENCOUNTER — Encounter (HOSPITAL_COMMUNITY): Payer: Self-pay | Admitting: Emergency Medicine

## 2018-10-13 DIAGNOSIS — F419 Anxiety disorder, unspecified: Secondary | ICD-10-CM | POA: Diagnosis not present

## 2018-10-13 DIAGNOSIS — R110 Nausea: Secondary | ICD-10-CM | POA: Diagnosis not present

## 2018-10-13 DIAGNOSIS — F17200 Nicotine dependence, unspecified, uncomplicated: Secondary | ICD-10-CM | POA: Insufficient documentation

## 2018-10-13 DIAGNOSIS — F319 Bipolar disorder, unspecified: Secondary | ICD-10-CM | POA: Insufficient documentation

## 2018-10-13 DIAGNOSIS — Z79899 Other long term (current) drug therapy: Secondary | ICD-10-CM | POA: Diagnosis not present

## 2018-10-13 DIAGNOSIS — N898 Other specified noninflammatory disorders of vagina: Secondary | ICD-10-CM | POA: Insufficient documentation

## 2018-10-13 DIAGNOSIS — F909 Attention-deficit hyperactivity disorder, unspecified type: Secondary | ICD-10-CM | POA: Diagnosis not present

## 2018-10-13 DIAGNOSIS — N760 Acute vaginitis: Secondary | ICD-10-CM | POA: Insufficient documentation

## 2018-10-13 DIAGNOSIS — B9689 Other specified bacterial agents as the cause of diseases classified elsewhere: Secondary | ICD-10-CM

## 2018-10-13 DIAGNOSIS — R109 Unspecified abdominal pain: Secondary | ICD-10-CM

## 2018-10-13 DIAGNOSIS — M419 Scoliosis, unspecified: Secondary | ICD-10-CM | POA: Insufficient documentation

## 2018-10-13 LAB — CBC
HCT: 38.6 % (ref 36.0–46.0)
Hemoglobin: 12.4 g/dL (ref 12.0–15.0)
MCH: 30 pg (ref 26.0–34.0)
MCHC: 32.1 g/dL (ref 30.0–36.0)
MCV: 93.5 fL (ref 80.0–100.0)
Platelets: 174 10*3/uL (ref 150–400)
RBC: 4.13 MIL/uL (ref 3.87–5.11)
RDW: 11.9 % (ref 11.5–15.5)
WBC: 7.5 10*3/uL (ref 4.0–10.5)
nRBC: 0 % (ref 0.0–0.2)

## 2018-10-13 LAB — URINALYSIS, ROUTINE W REFLEX MICROSCOPIC
Bilirubin Urine: NEGATIVE
Glucose, UA: NEGATIVE mg/dL
KETONES UR: NEGATIVE mg/dL
Nitrite: NEGATIVE
Protein, ur: NEGATIVE mg/dL
Specific Gravity, Urine: 1.021 (ref 1.005–1.030)
pH: 5 (ref 5.0–8.0)

## 2018-10-13 LAB — COMPREHENSIVE METABOLIC PANEL
ALBUMIN: 3.7 g/dL (ref 3.5–5.0)
ALT: 14 U/L (ref 0–44)
AST: 17 U/L (ref 15–41)
Alkaline Phosphatase: 45 U/L (ref 38–126)
Anion gap: 7 (ref 5–15)
BUN: 11 mg/dL (ref 6–20)
CO2: 30 mmol/L (ref 22–32)
Calcium: 9 mg/dL (ref 8.9–10.3)
Chloride: 105 mmol/L (ref 98–111)
Creatinine, Ser: 1.11 mg/dL — ABNORMAL HIGH (ref 0.44–1.00)
GFR calc Af Amer: >60 mL/min (ref >60)
GFR calc non Af Amer: >60 mL/min (ref >60)
Glucose, Bld: 84 mg/dL (ref 70–99)
Potassium: 4.1 mmol/L (ref 3.5–5.1)
Sodium: 142 mmol/L (ref 135–145)
Total Bilirubin: <0.1 mg/dL — ABNORMAL LOW (ref 0.3–1.2)
Total Protein: 6.1 g/dL — ABNORMAL LOW (ref 6.5–8.1)

## 2018-10-13 LAB — I-STAT BETA HCG BLOOD, ED (MC, WL, AP ONLY): I-stat hCG, quantitative: <5 m[IU]/mL (ref <5)

## 2018-10-13 LAB — LIPASE, BLOOD: Lipase: 37 U/L (ref 11–51)

## 2018-10-13 NOTE — ED Triage Notes (Signed)
Patient reports right abdominal pain radiating to right lower back onset Saturday this week with mild nausea / no emesis or diarrhea , denies fever or chills , pt. stated scheduled colonoscopy on Feb.14 .

## 2018-10-14 LAB — WET PREP, GENITAL
Sperm: NONE SEEN
Trich, Wet Prep: NONE SEEN
Yeast Wet Prep HPF POC: NONE SEEN

## 2018-10-14 MED ORDER — ONDANSETRON 4 MG PO TBDP
4.0000 mg | ORAL_TABLET | Freq: Once | ORAL | Status: AC
  Administered 2018-10-14: 4 mg via ORAL
  Filled 2018-10-14: qty 1

## 2018-10-14 MED ORDER — ONDANSETRON HCL 8 MG PO TABS: 8.0000 mg | ORAL_TABLET | Freq: Two times a day (BID) | ORAL | 2 refills | Status: DC

## 2018-10-14 MED ORDER — ACETAMINOPHEN 325 MG PO TABS
650.0000 mg | ORAL_TABLET | Freq: Once | ORAL | Status: AC
  Administered 2018-10-14: 650 mg via ORAL
  Filled 2018-10-14: qty 2

## 2018-10-14 MED ORDER — METRONIDAZOLE 500 MG PO TABS: 500.0000 mg | ORAL_TABLET | Freq: Two times a day (BID) | ORAL | 0 refills | Status: DC

## 2018-10-14 NOTE — Discharge Instructions (Signed)
Take Flagyl as prescribed.  Take Zofran as needed for nausea.  Follow-up with your primary care provider in 2 days for continued evaluation.  Keep your GI appointment on 2/14.  Return to the ED immediately for new or worsening symptoms or concerns, such as vomiting, fevers, new or worsening abdominal pain or any concerns at all.

## 2018-10-14 NOTE — ED Provider Notes (Signed)
[facility]PART[facility] Provider Note   CSN: [id number] Arrival date & time: 10/13/18  2154     History   Chief Complaint Chief Complaint  Patient presents with  . Abdominal Pain    HPI Mary Hogan is a 38 y.o. female.  HPI   38 year old female presents with abdominal pain for 4 days.  Pain is any right mid abdomen, nonradiating.  She describes pain as pinpoint and sharp.  She notes associated nausea.  This is the same pain he has had intermittently for a year.  She denies any change in his pain from the pain she has had over the last year.   She just states that the symptoms intermittently worsen.  Does note that she had some brown discharge from the vagina which she felt like was old blood.  She states she has had a endometrial ablation prior and occasionally gets some vaginal bleeding.  She was concerned about this bleeding though because it was very dark brown.  She denies any dysuria, urinary urgency, urinary frequency.    Past Medical History:  Diagnosis Date  . Abnormal Pap smear   . Anxiety   . Depression   . Headache(784.0)   . IBS (irritable bowel syndrome)   . Scoliosis     Patient Active Problem List   Diagnosis Date Noted  . Abdominal pain 09/24/2018  . Abnormal CT of the abdomen 09/24/2018  . Other constipation 09/24/2018  . Change in bowel habits 09/24/2018  . Anxiety 07/07/2018  . Encounter for blood transfusion 07/07/2018  . Scoliosis 07/07/2018  . Psychotic disorder ([place]) 01/20/2018  . Methamphetamine abuse ([place]) 10/27/2017  . UTI due to extended-spectrum beta lactamase (ESBL) producing Escherichia coli 06/09/2016  . Bipolar affective disorder, currently manic, moderate ([place]) 06/05/2016  . URI (upper respiratory infection) 12/23/2013  . Attention deficit disorder 06/14/2013  . Generalized anxiety disorder 06/14/2013  . Essential (primary) hypertension 05/02/2013  . SOB (shortness of breath) 05/02/2013  . Chest pain  03/08/2012  . MVP (mitral valve prolapse) 03/08/2012  . Palpitations 03/08/2012  . Hx of migraine headaches 02/19/2012  . Polysubstance overdose 12/25/2011  . Bipolar disorder current episode depressed ([place]) 12/06/2011    Class: Acute  . IBS (irritable bowel syndrome) 10/13/2011  . Epigastric pain 08/27/2011  . Chronic pain 03/05/2011  . Migraine 03/05/2011  . TOBACCO USER 05/26/2009  . AMENORRHEA, SECONDARY 01/26/2007  . DEPRESSIVE DISORDER, NOS 11/05/2006  . RESTLESS LEGS SYNDROME 11/05/2006  . PAPANICOLAOU SMEAR, ABNORMAL 11/05/2006  . PAPANICOLAOU SMEAR, ABNORMAL 11/05/2006    Past Surgical History:  Procedure Laterality Date  . BREAST ENHANCEMENT SURGERY  2008  . DILATION AND CURETTAGE OF UTERUS    . ENDOMETRIAL ABLATION    . LAPAROSCOPIC OVARIAN CYSTECTOMY  2012  . TUBAL LIGATION       OB History    Gravida  9   Para  4   Term  2   Preterm  2   AB  5   Living  4     SAB  2   TAB  2   Ectopic  1   Multiple      Live Births               Home Medications    Prior to Admission medications   Medication Sig Start Date End Date Taking? Authorizing Provider  clonazePAM (KLONOPIN) 0.5 MG tablet Take 0.5 mg by mouth at bedtime.   Yes [provider]  gabapentin (NEURONTIN) 300 MG capsule Take 300 mg by mouth daily as needed (for pain).  09/15/18  Yes [provider]  modafinil (PROVIGIL) 100 MG tablet Take 50-100 mg by mouth See admin instructions. Take 1/2 to 1 tablet every morning and at noon 05/13/18  Yes [provider]  omeprazole (PRILOSEC) 40 MG capsule Take 1 capsule (40 mg total) by mouth 2 (two) times daily. 09/22/18  Yes Mansouraty, Telford Nab., MD  ondansetron (ZOFRAN) 8 MG tablet Take 1 tablet (8 mg total) by mouth every 6 (six) hours as needed for nausea or vomiting. 09/22/18  Yes Mansouraty, Telford Nab., MD  SUPREP BOWEL PREP KIT 17.5-3.13-1.6 GM/177ML SOLN Take 1 kit by mouth as directed. Patient not taking:  Reported on 10/13/2018 09/22/18   Mansouraty, Telford Nab., MD    Family History Family History  Problem Relation Age of Onset  . Alzheimer's disease Mother   . Hypertension Mother   . Diabetes Mother   . Anxiety disorder Mother   . Depression Mother   . Post-traumatic stress disorder Mother   . Irritable bowel syndrome Mother   . Parkinson's disease Mother   . Diabetes Father   . Liver disease Maternal Grandfather   . Pancreatic cancer Maternal Uncle   . Stomach cancer Maternal Uncle   . Diabetes Maternal Uncle   . Colon cancer Maternal Uncle   . Anesthesia problems Neg Hx   . Hypotension Neg Hx   . Malignant hyperthermia Neg Hx   . Pseudochol deficiency Neg Hx   . Esophageal cancer Neg Hx     Social History Social History   Tobacco Use  . Smoking status: Current Every Day Smoker    Packs/day: 1.00  . Smokeless tobacco: Never Used  Substance Use Topics  . Alcohol use: Yes    Comment: occasional  . Drug use: Never     Allergies   Ketorolac tromethamine; Amoxicillin; Penicillins; and Tramadol   Review of Systems Review of Systems  Constitutional: Negative for chills and fever.  HENT: Negative for rhinorrhea and sore throat.   Eyes: Negative for visual disturbance.  Respiratory: Negative for cough and shortness of breath.   Cardiovascular: Negative for chest pain and leg swelling.  Gastrointestinal: Positive for abdominal pain and nausea. Negative for diarrhea and vomiting.  Genitourinary: Positive for vaginal bleeding and vaginal discharge. Negative for dysuria, frequency and urgency.  Musculoskeletal: Negative for joint swelling and neck pain.  Skin: Negative for rash and wound.  Neurological: Negative for syncope and numbness.  All other systems reviewed and are negative.    Physical Exam Updated Vital Signs BP 119/80 (BP Location: Left Arm)   Pulse 96   Temp 98.6 F (37 C) (Oral)   Resp 18   SpO2 99%   Physical Exam Vitals signs and nursing note  reviewed. Exam conducted with a chaperone present.  Constitutional:      Appearance: She is well-developed.  HENT:     Head: Normocephalic and atraumatic.  Eyes:     Conjunctiva/sclera: Conjunctivae normal.  Neck:     Musculoskeletal: Neck supple.  Cardiovascular:     Rate and Rhythm: Normal rate and regular rhythm.     Heart sounds: Normal heart sounds. No murmur.  Pulmonary:     Effort: Pulmonary effort is normal. No respiratory distress.     Breath sounds: Normal breath sounds. No wheezing or rales.  Abdominal:     General: Bowel sounds are normal. There is no distension.  Palpations: Abdomen is soft.     Tenderness: There is abdominal tenderness. There is no guarding or rebound. Negative signs include Murphy's sign and McBurney's sign.    Genitourinary:    Vagina: Bleeding (dried blood noted on exam) present.     Cervix: No cervical motion tenderness.     Uterus: Not tender.      Adnexa: Right adnexa normal and left adnexa normal.       Right: No tenderness.         Left: No tenderness.    Musculoskeletal: Normal range of motion.        General: No tenderness or deformity.  Skin:    General: Skin is warm and dry.     Findings: No erythema or rash.  Neurological:     Mental Status: She is alert and oriented to person, place, and time.  Psychiatric:        Behavior: Behavior normal.      ED Treatments / Results  Labs (all labs ordered are listed, but only abnormal results are displayed) Labs Reviewed  COMPREHENSIVE METABOLIC PANEL - Abnormal; Notable for the following components:      Result Value   Creatinine, Ser 1.11 (*)    Total Protein 6.1 (*)    Total Bilirubin <0.1 (*)    All other components within normal limits  URINALYSIS, ROUTINE W REFLEX MICROSCOPIC - Abnormal; Notable for the following components:   Hgb urine dipstick LARGE (*)    Leukocytes, UA TRACE (*)    Bacteria, UA RARE (*)    All other components within normal limits  WET PREP, GENITAL    LIPASE, BLOOD  CBC  I-STAT BETA HCG BLOOD, ED ([facility], [facility], [facility] ONLY)  GC/CHLAMYDIA PROBE AMP ([place]) NOT AT [facility]    EKG None  Radiology No results found.  Procedures Procedures (including critical care time)  Medications Ordered in ED Medications - No data to display   Initial Impression / Assessment and Plan / ED Course  I have reviewed the triage vital signs and the nursing notes.  Pertinent labs & imaging results that were available during my care of the patient were reviewed by me and considered in my medical decision making (see chart for details).     Presented with abdominal pain in the right mid abdomen.  Has pinpoint tenderness.  However this is the same pain she has had intermittently for a year.  She has had a CT scan which showed no acute findings.  He has no McBurney point tenderness, or Murphy sign.  She is resting comfortably in the bed, laughing and joking with the provider.  Do not believe that she has an acute abdomen or needs repeat imaging at this time.  She has follow-up scheduled with GI on 2/14.  She does note a new complaint of vaginal bleeding and vaginal discharge.  Pelvic exam shows no cervical motion tenderness, adnexal tenderness.  However there was scant discharge noted.  Her wet mount is positive for clue cells, consistent with bacterial vaginosis.  Will send in Flagyl for this.  Her blood work shows no significant change.  Her urine shows some leukocyte esterase and rare bacteria however patient is asymptomatic, do not feel that this is a UTI.  Patient is not pregnant.  We will also write her for some nausea medicine.  Patient requesting work note.  Encourage close follow-up and continued monitoring.  Given strict return precautions.  Patient expressed understanding.  She is ready and  stable for discharge.   At this time there does not appear to be any evidence of an acute emergency medical condition and the patient appears stable for discharge with  appropriate outpatient follow up.Diagnosis was discussed with patient who verbalizes understanding and is agreeable to discharge.  Final Clinical Impressions(s) / ED Diagnoses   Final diagnoses:  None    ED Discharge Orders    None       Rachel Moulds 10/14/18 0026    Davonna Belling, MD 10/14/18 2352

## 2018-10-16 LAB — GC/CHLAMYDIA PROBE AMP (~~LOC~~) NOT AT ARMC
Chlamydia: NEGATIVE
Neisseria Gonorrhea: NEGATIVE

## 2018-10-22 ENCOUNTER — Ambulatory Visit (AMBULATORY_SURGERY_CENTER): Payer: Medicaid Other | Admitting: Gastroenterology

## 2018-10-22 ENCOUNTER — Telehealth: Payer: Self-pay

## 2018-10-22 ENCOUNTER — Encounter: Payer: Self-pay | Admitting: Gastroenterology

## 2018-10-22 VITALS — BP 94/57 | HR 78 | Temp 97.1°F | Resp 11 | Ht 65.0 in | Wt 148.0 lb

## 2018-10-22 DIAGNOSIS — R12 Heartburn: Secondary | ICD-10-CM

## 2018-10-22 DIAGNOSIS — K649 Unspecified hemorrhoids: Secondary | ICD-10-CM | POA: Diagnosis not present

## 2018-10-22 DIAGNOSIS — R194 Change in bowel habit: Secondary | ICD-10-CM

## 2018-10-22 DIAGNOSIS — R1310 Dysphagia, unspecified: Secondary | ICD-10-CM | POA: Diagnosis not present

## 2018-10-22 DIAGNOSIS — R1084 Generalized abdominal pain: Secondary | ICD-10-CM

## 2018-10-22 DIAGNOSIS — R933 Abnormal findings on diagnostic imaging of other parts of digestive tract: Secondary | ICD-10-CM

## 2018-10-22 DIAGNOSIS — R109 Unspecified abdominal pain: Secondary | ICD-10-CM

## 2018-10-22 MED ORDER — SODIUM CHLORIDE 0.9 % IV SOLN: 500.0000 mL | Freq: Once | INTRAVENOUS | Status: DC

## 2018-10-22 NOTE — Patient Instructions (Signed)
YOU HAD AN ENDOSCOPIC PROCEDURE TODAY AT [facility]:   Refer to the procedure report that was given to you for any specific questions about what was found during the examination.  If the procedure report does not answer your questions, please call your gastroenterologist to clarify.  If you requested that your care partner not be given the details of your procedure findings, then the procedure report has been included in a sealed envelope for you to review at your convenience later.  YOU SHOULD EXPECT: Some feelings of bloating in the abdomen. Passage of more gas than usual.  Walking can help get rid of the air that was put into your GI tract during the procedure and reduce the bloating. If you had a lower endoscopy (such as a colonoscopy or flexible sigmoidoscopy) you may notice spotting of blood in your stool or on the toilet paper. If you underwent a bowel prep for your procedure, you may not have a normal bowel movement for a few days.  Please Note:  You might notice some irritation and congestion in your nose or some drainage.  This is from the oxygen used during your procedure.  There is no need for concern and it should clear up in a day or so.  SYMPTOMS TO REPORT IMMEDIATELY:   Following lower endoscopy (colonoscopy or flexible sigmoidoscopy):  Excessive amounts of blood in the stool  Significant tenderness or worsening of abdominal pains  Swelling of the abdomen that is new, acute  Fever of 100F or higher   Following upper endoscopy (EGD)  Vomiting of blood or coffee ground material  New chest pain or pain under the shoulder blades  Painful or persistently difficult swallowing  New shortness of breath  Fever of 100F or higher  Black, tarry-looking stools  For urgent or emergent issues, a gastroenterologist can be reached at any hour by calling [phone].   DIET:  We do recommend a small meal at first, but then you may proceed to your regular diet.  Drink  plenty of fluids but you should avoid alcoholic beverages for 24 hours.  ACTIVITY:  You should plan to take it easy for the rest of today and you should NOT DRIVE or use heavy machinery until tomorrow (because of the sedation medicines used during the test).    FOLLOW UP: Our staff will call the number listed on your records the next business day following your procedure to check on you and address any questions or concerns that you may have regarding the information given to you following your procedure. If we do not reach you, we will leave a message.  However, if you are feeling well and you are not experiencing any problems, there is no need to return our call.  We will assume that you have returned to your regular daily activities without incident.  If any biopsies were taken you will be contacted by phone or by letter within the next 1-3 weeks.  Please call us at [phone] if you have not heard about the biopsies in 3 weeks.    SIGNATURES/CONFIDENTIALITY: You and/or your care partner have signed paperwork which will be entered into your electronic medical record.  These signatures attest to the fact that that the information above on your After Visit Summary has been reviewed and is understood.  Full responsibility of the confidentiality of this discharge information lies with you and/or your care-partner.  Await pathology  Please read over handouts about hemorrhoids and high  fiber diets  Continue Miralax daily and normal medications  Next colonoscopy- 10 years

## 2018-10-22 NOTE — Telephone Encounter (Signed)
Left message on machine to call back to find out where the CT from 9/19 was completed.

## 2018-10-22 NOTE — Progress Notes (Signed)
Report given to PACU, vss 

## 2018-10-22 NOTE — Op Note (Signed)
[facility] Patient Name: Mary Hogan Procedure Date: 10/22/2018 12:01 PM MRN: [id number] Endoscopist: Justice Britain , MD Age: 38 Referring MD:  Date of Birth: August 09, 1981 Gender: Female Account #: [id number] Procedure:                Upper GI endoscopy Indications:              Generalized abdominal pain, Dysphagia, Heartburn,                            Abnormal CT of the GI tract (outside read                            concerning for possible duodenal mass vs                            extraduodenal lesion - pending review at [facility]) Medicines:                Monitored Anesthesia Care Procedure:                Pre-Anesthesia Assessment:                           - Prior to the procedure, a History and Physical                            was performed, and patient medications and                            allergies were reviewed. The patient's tolerance of                            previous anesthesia was also reviewed. The risks                            and benefits of the procedure and the sedation                            options and risks were discussed with the patient.                            All questions were answered, and informed consent                            was obtained. Prior Anticoagulants: The patient has                            taken no previous anticoagulant or antiplatelet                            agents. ASA Grade Assessment: II - A patient with                            mild systemic disease. After reviewing the risks  and benefits, the patient was deemed in                            satisfactory condition to undergo the procedure.                           After obtaining informed consent, the endoscope was                            passed under direct vision. Throughout the                            procedure, the patient's blood pressure, pulse, and                            oxygen saturations  were monitored continuously. The                            Model PCF-H190DL [id number]) scope was introduced                            through the mouth, and advanced to the proximal                            jejunum. The upper GI endoscopy was accomplished                            without difficulty. The patient tolerated the                            procedure. Scope In: Scope Out: Findings:                 No gross lesions were noted in the entire                            esophagus. Biopsies were taken with a cold forceps                            for histology.                           The Z-line was regular and was found 40 cm from the                            incisors.                           No gross lesions were noted in the entire examined                            stomach. Biopsies were taken with a cold forceps                            for histology and Helicobacter pylori testing.  Normal mucosa was found in the duodenal bulb, in                            the first portion of the duodenum, in the second                            portion of the duodenum, in the third portion of                            the duodenum and in the fourth portion of the                            duodenum. There is no evidence of an intramucosal                            mass/lesion. Biopsies were taken with a cold                            forceps for histology in the SB jar.                           Normal mucosa was found in the jejunum. Biopsies                            were taken with a cold forceps for histology in the                            SB jar. Complications:            No immediate complications. Estimated Blood Loss:     Estimated blood loss was minimal. Impression:               - No gross lesions in esophagus. Biopsied.                           - Z-line regular, 40 cm from the incisors.                           - No gross  lesions in the stomach. Biopsied.                           - Normal mucosa was found in the duodenal bulb, in                            the first portion of the duodenum, in the second                            portion of the duodenum, in the third portion of                            the duodenum and in the fourth portion of the  duodenum. Biopsied.                           - Normal mucosa was found in the jejunum. Biopsied. Recommendation:           - Proceed to scheduled colonoscopy.                           - Await pathology results.                           - The findings and recommendations were discussed                            with the patient.                           - The findings and recommendations were discussed                            with the patient's family. Justice Britain, MD 10/22/2018 12:43:33 PM

## 2018-10-22 NOTE — Op Note (Signed)
[facility] Patient Name: Mary Hogan Procedure Date: 10/22/2018 12:22 PM MRN: [id number] Endoscopist: Justice Britain , MD Age: 38 Referring MD:  Date of Birth: [birth year] Gender: Female Account #: [id number] Procedure:                Colonoscopy Indications:              Generalized abdominal pain, Abnormal CT of the GI                            tract, Change in bowel habits Medicines:                Monitored Anesthesia Care Procedure:                Pre-Anesthesia Assessment:                           - Prior to the procedure, a History and Physical                            was performed, and patient medications and                            allergies were reviewed. The patient's tolerance of                            previous anesthesia was also reviewed. The risks                            and benefits of the procedure and the sedation                            options and risks were discussed with the patient.                            All questions were answered, and informed consent                            was obtained. Prior Anticoagulants: The patient has                            taken no previous anticoagulant or antiplatelet                            agents. ASA Grade Assessment: II - A patient with                            mild systemic disease. After reviewing the risks                            and benefits, the patient was deemed in                            satisfactory condition to undergo the procedure.  After obtaining informed consent, the colonoscope                            was passed under direct vision. Throughout the                            procedure, the patient's blood pressure, pulse, and                            oxygen saturations were monitored continuously. The                            Model PCF-H190DL [id number]) scope was introduced                            through the anus and  advanced to the 6 cm into the                            ileum. The colonoscopy was performed without                            difficulty. The patient tolerated the procedure.                            The quality of the bowel preparation was good. The                            terminal ileum, ileocecal valve, appendiceal                            orifice, and rectum were photographed. Scope In: 12:23:24 PM Scope Out: 12:38:49 PM Scope Withdrawal Time: 0 hours 9 minutes 54 seconds  Total Procedure Duration: 0 hours 15 minutes 25 seconds  Findings:                 The perianal and digital rectal examinations were                            normal. Pertinent negatives include no palpable                            rectal lesions.                           The terminal ileum and ileocecal valve appeared                            normal. Biopsies were taken with a cold forceps for                            histology.                           Normal mucosa was found in the entire colon.  Biopsies for histology were taken with a cold                            forceps from the cecum, right colon, transverse                            colon, descending colon, sigmoid colon and                            rectosigmoid colon for evaluation of microscopic                            colitis.                           Non-bleeding non-thrombosed internal hemorrhoids                            were found during retroflexion, during perianal                            exam and during digital exam. The hemorrhoids were                            Grade I (internal hemorrhoids that do not prolapse). Complications:            No immediate complications. Estimated Blood Loss:     Estimated blood loss was minimal. Impression:               - The examined portion of the ileum was normal.                            Biopsied.                           - Normal mucosa in the  entire examined colon.                            Biopsied.                           - Non-bleeding non-thrombosed internal hemorrhoids. Recommendation:           - The patient will be observed post-procedure,                            until all discharge criteria are met.                           - Discharge patient to home.                           - Patient has a contact number available for                            emergencies. The signs and symptoms of potential  delayed complications were discussed with the                            patient. Return to normal activities tomorrow.                            Written discharge instructions were provided to the                            patient.                           - High fiber diet.                           - Continue present medications.                           - Await pathology results.                           - Continue Miralax daily.                           - Repeat colonoscopy in 10 years for colon cancer                            screening purposes.                           - Return to GI clinic as previously scheduled.                           - The findings and recommendations were discussed                            with the patient.                           - The findings and recommendations were discussed                            with the patient's family. Justice Britain, MD 10/22/2018 12:48:46 PM

## 2018-10-22 NOTE — Telephone Encounter (Signed)
-----   Message from Irving Copas., MD sent at 10/22/2018 10:30 AM EST ----- Regarding: Get a Radiology Interpretation Dear Chong Sicilian, Can you get a radiology interpretation of the 05/27/18 CT scan that we have uploaded into the system? Thanks. GM

## 2018-10-25 ENCOUNTER — Telehealth: Payer: Self-pay

## 2018-10-25 NOTE — Telephone Encounter (Signed)
  Follow up Call-  Call back number 10/22/2018  Post procedure Call Back phone  # [phone]  Permission to leave phone message Yes  Some recent data might be hidden     Patient questions:  Do you have a fever, pain , or abdominal swelling? No. Pain Score  0 *  Have you tolerated food without any problems? Yes.    Have you been able to return to your normal activities? Yes.    Do you have any questions about your discharge instructions: Diet   No. Medications  No. Follow up visit  No.  Do you have questions or concerns about your Care? No.  Actions: * If pain score is 4 or above: No action needed, pain <4.

## 2018-10-25 NOTE — Telephone Encounter (Signed)
The pt states she had her CT at [facility]

## 2018-10-25 NOTE — Telephone Encounter (Signed)
Dr Rush Landmark the CT is in Care everywear I have copied the report here.       Result Narrative      Acute Interface, Incoming Rad Results - 05/27/2018  3:01 PM EDT CT ABDOMEN AND PELVIS WITH CONTRAST  INDICATION: RLQ pain, appendicitis suspected  COMPARISON:  CT July 19, 2017    TECHNIQUE:  CT imaging of the abdomen and pelvis was performed after the intravenous administration of 80 mL of Isovue-370 iodinated contrast. Dose reduction was utilized (automated exposure control, mA or kV adjustment based on patient size, or  iterative image reconstruction). Coronal and sagittal reformatted images were generated and reviewed.  FINDINGS:   - Lower Thorax: Partially visualized bilateral breast prostheses.  - Liver: Normal contour and attenuation.  No focal lesions.  The portal veins are patent.   - Biliary Tree and Gallbladder: No biliary ductal dilatation.  The gallbladder is unremarkable.   - Pancreas: Normal.   - Spleen: Normal.    - Adrenal Glands: Normal.   - Kidneys and Bladder: No hydronephrosis. No focal renal lesions. The bladder is unremarkable.  - Gastrointestinal Tract: No bowel obstruction or bowel wall thickening. Normal appendix.  - Peritoneal Cavity and Retroperitoneum: Trace free fluid in the pelvis which is likely physiologic.  No free intraperitoneal air. There is a small amount of a more solid soft tissue anterior to the third portion of duodenum measuring approximately 2.2 x  1.8 cm (series 2, image 62).  - Lymph Nodes: No retroperitoneal, mesenteric or pelvic lymphadenopathy.    - Vasculature: Normal caliber abdominal aorta. Patent proximal aortic branch vessels.  - Reproductive: Small corpus luteum within the left ovary.  - Musculoskeletal: No aggressive appearing osseous lesions.  - Miscellaneous: N/A   Impression: No acute findings in the abdomen or pelvis.  There is a small amount of ill-defined soft tissue anterior to the third portion of  the duodenum. It is uncertain etiology and is new from 2016. Question whether this could represent atypical lymphoid tissue. Recommend PET scan for further evaluation.  Electronically Signed by: Sol Passer

## 2018-10-25 NOTE — Telephone Encounter (Signed)
  Follow up Call-  Call back number 10/22/2018  Post procedure Call Back phone  # [phone]  Permission to leave phone message Yes  Some recent data might be hidden     Left message on voicemail

## 2018-10-25 NOTE — Telephone Encounter (Signed)
Ok I will try and get that done.

## 2018-10-25 NOTE — Telephone Encounter (Signed)
Thank you Patty. I had read this. I actually wanted an overread by our Radiology group here. Thanks. GM

## 2018-10-26 NOTE — Telephone Encounter (Signed)
Thanks for the help. I'll look for it in my results later this week. Thanks. GM

## 2018-10-26 NOTE — Telephone Encounter (Signed)
[id number] Requested over read. Spoke with Henrene Pastor in radiology.  The over read should be complete by the end of the week

## 2018-10-27 ENCOUNTER — Encounter: Payer: Self-pay | Admitting: Gastroenterology

## 2018-11-02 ENCOUNTER — Other Ambulatory Visit: Payer: Self-pay

## 2018-11-02 DIAGNOSIS — R935 Abnormal findings on diagnostic imaging of other abdominal regions, including retroperitoneum: Secondary | ICD-10-CM

## 2018-11-02 DIAGNOSIS — R109 Unspecified abdominal pain: Secondary | ICD-10-CM

## 2018-11-22 ENCOUNTER — Other Ambulatory Visit: Payer: Self-pay

## 2018-11-22 ENCOUNTER — Ambulatory Visit (INDEPENDENT_AMBULATORY_CARE_PROVIDER_SITE_OTHER)
Admission: RE | Admit: 2018-11-22 | Discharge: 2018-11-22 | Disposition: A | Discharge status: Home / Self Care | Payer: Medicaid Other | Source: Ambulatory Visit | Attending: Gastroenterology | Admitting: Gastroenterology

## 2018-11-22 DIAGNOSIS — R935 Abnormal findings on diagnostic imaging of other abdominal regions, including retroperitoneum: Secondary | ICD-10-CM

## 2018-11-22 DIAGNOSIS — R109 Unspecified abdominal pain: Secondary | ICD-10-CM | POA: Diagnosis not present

## 2018-11-22 MED ORDER — IOHEXOL 300 MG/ML  SOLN
100.0000 mL | Freq: Once | INTRAMUSCULAR | Status: AC | PRN
  Administered 2018-11-22: 100 mL via INTRAVENOUS

## 2018-11-23 ENCOUNTER — Other Ambulatory Visit: Payer: Self-pay

## 2018-11-23 DIAGNOSIS — R935 Abnormal findings on diagnostic imaging of other abdominal regions, including retroperitoneum: Secondary | ICD-10-CM

## 2018-11-23 DIAGNOSIS — R109 Unspecified abdominal pain: Secondary | ICD-10-CM

## 2018-12-23 ENCOUNTER — Other Ambulatory Visit: Payer: Self-pay

## 2018-12-23 ENCOUNTER — Ambulatory Visit (INDEPENDENT_AMBULATORY_CARE_PROVIDER_SITE_OTHER): Payer: Medicaid Other | Admitting: Gastroenterology

## 2018-12-23 DIAGNOSIS — R110 Nausea: Secondary | ICD-10-CM

## 2018-12-23 DIAGNOSIS — R935 Abnormal findings on diagnostic imaging of other abdominal regions, including retroperitoneum: Secondary | ICD-10-CM

## 2018-12-23 DIAGNOSIS — R1084 Generalized abdominal pain: Secondary | ICD-10-CM

## 2018-12-23 DIAGNOSIS — G8929 Other chronic pain: Secondary | ICD-10-CM | POA: Diagnosis not present

## 2018-12-23 MED ORDER — ONDANSETRON HCL 8 MG PO TABS: 8.0000 mg | ORAL_TABLET | Freq: Two times a day (BID) | ORAL | 4 refills | Status: DC

## 2018-12-23 NOTE — Patient Instructions (Addendum)
If you are age 40 or older, your body mass index should be between 23-30. Your There is no height or weight on file to calculate BMI. If this is out of the aforementioned range listed, please consider follow up with your Primary Care Provider.  If you are age 2 or younger, your body mass index should be between 19-25. Your There is no height or weight on file to calculate BMI. If this is out of the aformentioned range listed, please consider follow up with your Primary Care Provider.    It has been recommended to you by your physician that you have a(n) EUS  at hospital completed.  We did not schedule the procedure(s) today due to Covid-19 . Please contact our office at [phone] should you decide to have the procedure completed.  We have sent the following medications to your pharmacy for you to pick up at your convenience: Zofran   Dr. Rush Landmark would like for the EUS to be done in 6-8 weeks.  Thank you for choosing me and [facility].  Dr. Rush Landmark

## 2018-12-23 NOTE — Progress Notes (Signed)
[facility] VISIT   [facility] [place] [address] [phone]  Patient Profile: Mary Hogan is a 38 y.o. female with a pmh significant for reported irritable bowel syndrome, scoliosis, headaches, anxiety/MDD, prior ruptured ovarian cyst, UTIs, chronic abdominal pain NOS.  The patient presents to the [facility] for an evaluation and management of problem(s) noted below:  Problem List 1. Abdominal pain, chronic, generalized   2. Abnormal CT of the abdomen   3. Nausea without vomiting     History of Present Illness Please see initial consultation note for full details of HPI.  Due to the COVID-19 Pandemic, this service was provided via telemedicine using audiovisual media.  The patient was located at home. The provider was located in the office. The patient did consent to this visit and is aware of charges through their insurance. Other persons participating in this telemedicine service were none.  Interval History This is a scheduled follow-up.  Compared to where the patient was when she initially presented and since her diagnostic endoscopy/colonoscopy were performed the patient is doing better.  What is the reason for why she is feeling better is not clear.  She still taking nausea medications on an as-needed basis which we prescribed that has been significantly helpful for her in regards to allowing her to eat and maintain her weight.  The COVID-19 pandemic has been difficult for the patient as a result of loss of her job but she is trying to make ends meet.  This is been stressful for her but has not caused progressive GI issues.  She took omeprazole as we have prescribed but after she took that she felt that it caused her to have significantly bad breath as if she had just eaten wrong eggs and as a result she stopped taking it.  Her weight has been stable.  GI  Review of Systems Positive as above Negative for early satiety, anorexia, dysphagia, odynophagia, change in bowel habits, melena, hematochezia  Review of Systems General: Denies fevers/chills HEENT: Denies oral lesions Cardiovascular: Denies chest pain Pulmonary: Denies shortness of breath Gastroenterological: See HPI Genitourinary: Denies darkened urine Hematological: Denies easy bruising/bleeding Dermatological: Denies jaundice Psychological: Mood is stable   Medications Current Outpatient Medications  Medication Sig Dispense Refill   clonazePAM (KLONOPIN) 0.5 MG tablet Take 0.5 mg by mouth at bedtime.     modafinil (PROVIGIL) 100 MG tablet Take 50-100 mg by mouth See admin instructions. Take 1/2 to 1 tablet every morning and at noon     ondansetron (ZOFRAN) 8 MG tablet Take 1 tablet (8 mg total) by mouth 2 (two) times daily. 60 tablet 4   No current facility-administered medications for this visit.     Allergies Allergies  Allergen Reactions   Ketorolac Tromethamine Other (See Comments)    Stomach burning in stomach.   Amoxicillin Hives       Penicillins Hives   Tramadol Hives    Histories Past Medical History:  Diagnosis Date   Abnormal Pap smear    Anxiety    Depression    Headache(784.0)    IBS (irritable bowel syndrome)    Scoliosis    Past Surgical History:  Procedure Laterality Date   BREAST ENHANCEMENT SURGERY  2008   DILATION AND CURETTAGE OF UTERUS     ENDOMETRIAL ABLATION     LAPAROSCOPIC OVARIAN CYSTECTOMY  2012   TUBAL LIGATION     Social History  Socioeconomic History   Marital status: Single    Spouse name: Not on file   Number of children: 4   Years of education: Not on file   Highest education level: Not on file  Occupational History   Occupation: server  Social Needs   Financial resource strain: Not on file   Food insecurity:    Worry: Not on file    Inability: Not on file   Transportation needs:     Medical: Not on file    Non-medical: Not on file  Tobacco Use   Smoking status: Current Every Day Smoker    Packs/day: 1.00   Smokeless tobacco: Never Used  Substance and Sexual Activity   Alcohol use: Yes    Comment: occasional   Drug use: Never   Sexual activity: Yes    Birth control/protection: Surgical  Lifestyle   Physical activity:    Days per week: Not on file    Minutes per session: Not on file   Stress: Not on file  Relationships   Social connections:    Talks on phone: Not on file    Gets together: Not on file    Attends religious service: Not on file    Active member of club or organization: Not on file    Attends meetings of clubs or organizations: Not on file    Relationship status: Not on file   Intimate partner violence:    Fear of current or ex partner: Not on file    Emotionally abused: Not on file    Physically abused: Not on file    Forced sexual activity: Not on file  Other Topics Concern   Not on file  Social History Narrative   Not on file   Family History  Problem Relation Age of Onset   Alzheimer's disease Mother    Hypertension Mother    Diabetes Mother    Anxiety disorder Mother    Depression Mother    Post-traumatic stress disorder Mother    Irritable bowel syndrome Mother    Parkinson's disease Mother    Diabetes Father    Liver disease Maternal Grandfather    Pancreatic cancer Maternal Uncle    Stomach cancer Maternal Uncle    Diabetes Maternal Uncle    Colon cancer Maternal Uncle    Esophageal cancer Maternal Uncle    Anesthesia problems Neg Hx    Hypotension Neg Hx    Malignant hyperthermia Neg Hx    Pseudochol deficiency Neg Hx    I have reviewed her medical, social, and family history in detail and updated the electronic medical record as necessary.    PHYSICAL EXAMINATION  Telehealth visit   REVIEW OF DATA  I reviewed the following data at the time of this encounter:  GI Procedures and  Studies  2/20 Colonoscopy - The examined portion of the ileum was normal. Biopsied. - Normal mucosa in the entire examined colon. Biopsied. - Non-bleeding non-thrombosed internal hemorrhoids.  2/20 EGD - No gross lesions in esophagus. Biopsied. - Z-line regular, 40 cm from the incisors. - No gross lesions in the stomach. Biopsied. - Normal mucosa was found in the duodenal bulb, in the first portion of the duodenum, in the second portion of the duodenum, in the third portion of the duodenum and in the fourth portion of the duodenum. Biopsied. - Normal mucosa was found in the jejunum. Biopsied.   Laboratory Studies  Reviewed in care everywhere and epic  Imaging Studies  9/19 OSH CTAP  read My response is as follows: There is a small focus of mixed soft tissue and fat attenuation anterior to the pancreatic head and transverse duodenum (series 2/image 62), at the root of the mesentery. No definite wall thickening or dilatation in the stomach, duodenum or small bowel. No pancreatic mass. No radiopaque cholelithiasis. No biliary or pancreatic duct dilation. No discrete abdominopelvic adenopathy. This finding is nonspecific and of uncertain clinical significance, favor inflammatory such as mesenteric panniculitis. Given the absence of prior studies, follow-up CT abdomen/pelvis with oral and IV contrast is suggested in 3-6 months to evaluate the stability of this finding.  3/20 CTAP IMPRESSION: Improving vague soft tissue anterior to the pancreatic head, possibly reflecting sequela of mesenteric inflammation. Given improvement, this is not considered a concerning finding. If clinically warranted, follow-up CT can be performed in 6 months. Heterogeneous enhancement of the uterine fundus, poorly evaluated on CT. Consider pelvic ultrasound for further evaluation.   ASSESSMENT  Ms. Basher is a 38 y.o. female with a pmh significant for reported irritable bowel syndrome, scoliosis, headaches,  anxiety/MDD, prior ruptured ovarian cyst, UTIs, chronic abdominal pain NOS.  The patient is seen today for evaluation and management of:  1. Abdominal pain, chronic, generalized   2. Abnormal CT of the abdomen   3. Nausea without vomiting    The patient seems to be clinically stable at this point in time and is having some improvement in her chronic pain however it is still present.  The outside we review of her CT scan had suggested the possibility of mesenteric panniculitis.  Her more recent repeat CT had suggested some improvement in the size of that area suggesting an inflammatory process.  Whether this is mesenteric panniculitis or a lymph node or other process that is going on I do think because she has continued to have discomforts that further evaluation is reasonable.  As things are slightly improved the urgency of it in the COVID-19 pandemic is such that we can probably pursue this in a few weeks.  I think an endoscopic ultrasound to evaluate the region of the pancreas head as well as the surrounding portacaval region in that area to see if there is something to sample/biopsy is reasonable.  I will continue to give her antinausea medicines to help her.  I would like to get inflammatory markers in the setting of working up this potential inflammatory process as well as autoimmune antibodies to evaluate for the possibility of an IgG4 disease process.  The risks of EUS including bleeding, infection, aspiration pneumonia and intestinal perforation were discussed as was the possibility it may not give a definitive diagnosis.  If a biopsy of the pancreas is done as part of the EUS, there is an additional risk of pancreatitis at the rate of about 1%.  It was explained that procedure related pancreatitis is typically mild, although can be severe and even life threatening, which is why we do not perform random pancreatic biopsies and only biopsy a lesion we feel is concerning enough to warrant the risk.  The  risks and benefits of endoscopic evaluation were discussed with the patient; these include but are not limited to the risk of perforation, infection, bleeding, missed lesions, lack of diagnosis, severe illness requiring hospitalization, as well as anesthesia and sedation related illnesses.  The patient is agreeable to proceed.  Even if nothing is able to be visualized or sampled I will plan to repeat CT scan in a few months time and will dictate that  timing after her EUS.  All patient questions were answered, to the best of my ability, and the patient agrees to the aforementioned plan of action with follow-up as indicated.   PLAN  Labs as outlined below Zofran to be refilled Continue Colace Continue fiber supplement Continue MiraLAX as needed Proceed with diagnostic EUS to evaluate the region surrounding the pancreas head Likely repeat CT scan in 3 to 4 months (this will be dictated after's EUS performed)   No orders of the defined types were placed in this encounter.   New Prescriptions   No medications on file   Modified Medications   Modified Medication Previous Medication   ONDANSETRON (ZOFRAN) 8 MG TABLET ondansetron (ZOFRAN) 8 MG tablet      Take 1 tablet (8 mg total) by mouth 2 (two) times daily.    Take 1 tablet (8 mg total) by mouth 2 (two) times daily.    Planned Follow Up: No follow-ups on file.   Justice Britain, MD [facility] Advanced Endoscopy Office # [phone]

## 2018-12-26 ENCOUNTER — Encounter: Payer: Self-pay | Admitting: Gastroenterology

## 2018-12-26 DIAGNOSIS — R110 Nausea: Secondary | ICD-10-CM | POA: Insufficient documentation
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
## 2019-02-24 ENCOUNTER — Telehealth: Payer: Self-pay | Admitting: Gastroenterology

## 2019-02-24 NOTE — Telephone Encounter (Signed)
Patient called wanting to speak with the nurse for some advice she stated she was instructed by Dr.Mansouraty to call back if the symptoms get worse. She is stating that the abdominal pain is worse and need some advice thanks.

## 2019-02-24 NOTE — Telephone Encounter (Signed)
Rovanda do you have this patient down for EUS in June or July?  She is calling to set up.  I will take care of it just let me know if you have been working on it.

## 2019-02-24 NOTE — Telephone Encounter (Signed)
He put down that she could have EUS anytime. I called her on 02/10/19 and left message for her to call back, but I have not scheduled her for anything yet.

## 2019-02-25 NOTE — Telephone Encounter (Signed)
Left message on machine to call back  

## 2019-02-28 NOTE — Telephone Encounter (Signed)
Error mail box is full.  Letter mailed

## 2019-02-28 NOTE — Telephone Encounter (Signed)
Left message on machine to call back  

## 2019-03-02 ENCOUNTER — Other Ambulatory Visit: Payer: Self-pay

## 2019-03-02 DIAGNOSIS — R935 Abnormal findings on diagnostic imaging of other abdominal regions, including retroperitoneum: Secondary | ICD-10-CM

## 2019-03-02 DIAGNOSIS — R948 Abnormal results of function studies of other organs and systems: Secondary | ICD-10-CM

## 2019-03-10 ENCOUNTER — Inpatient Hospital Stay (HOSPITAL_COMMUNITY): Admission: RE | Admit: 2019-03-10 | Payer: Medicaid Other | Source: Ambulatory Visit

## 2019-03-10 NOTE — Progress Notes (Signed)
Message left for Mary Hogan to inquire her arrival today for covid 19 screening prior to her procedure 03/14/19 at [facility].

## 2019-03-10 NOTE — Progress Notes (Signed)
Dr Lilia Argue 's office informed Mary Hogan did not show up for scheduled Covid 19 appointment today 03/10/19.

## 2019-03-11 NOTE — Progress Notes (Signed)
Spoke with patient about upcoming procedure and missed covid appointment.  Pt was given wrong date (7/6) for covid testing.  Pt to go today to get tested for Monday's procedure.  Vista Lawman, RN

## 2019-03-14 ENCOUNTER — Encounter (HOSPITAL_COMMUNITY): Payer: Self-pay | Admitting: *Deleted

## 2019-03-14 ENCOUNTER — Ambulatory Visit (HOSPITAL_COMMUNITY): Payer: Medicaid Other | Admitting: Anesthesiology

## 2019-03-14 ENCOUNTER — Other Ambulatory Visit: Payer: Self-pay

## 2019-03-14 ENCOUNTER — Ambulatory Visit (HOSPITAL_COMMUNITY)
Admission: RE | Admit: 2019-03-14 | Discharge: 2019-03-14 | Disposition: A | Discharge status: Home / Self Care | Payer: Medicaid Other | Attending: Gastroenterology | Admitting: Gastroenterology

## 2019-03-14 ENCOUNTER — Other Ambulatory Visit (HOSPITAL_COMMUNITY)
Admission: RE | Admit: 2019-03-14 | Discharge: 2019-03-14 | Disposition: A | Discharge status: Home / Self Care | Payer: Medicaid Other | Source: Ambulatory Visit | Attending: Gastroenterology | Admitting: Gastroenterology

## 2019-03-14 ENCOUNTER — Encounter (HOSPITAL_COMMUNITY)
Admission: RE | Disposition: A | Discharge status: Home / Self Care | Payer: Self-pay | Source: Home / Self Care | Attending: Gastroenterology

## 2019-03-14 DIAGNOSIS — K589 Irritable bowel syndrome without diarrhea: Secondary | ICD-10-CM | POA: Diagnosis not present

## 2019-03-14 DIAGNOSIS — G2581 Restless legs syndrome: Secondary | ICD-10-CM | POA: Insufficient documentation

## 2019-03-14 DIAGNOSIS — Z820 Family history of epilepsy and other diseases of the nervous system: Secondary | ICD-10-CM | POA: Insufficient documentation

## 2019-03-14 DIAGNOSIS — R935 Abnormal findings on diagnostic imaging of other abdominal regions, including retroperitoneum: Secondary | ICD-10-CM

## 2019-03-14 DIAGNOSIS — F17200 Nicotine dependence, unspecified, uncomplicated: Secondary | ICD-10-CM | POA: Insufficient documentation

## 2019-03-14 DIAGNOSIS — Z833 Family history of diabetes mellitus: Secondary | ICD-10-CM | POA: Diagnosis not present

## 2019-03-14 DIAGNOSIS — Z8249 Family history of ischemic heart disease and other diseases of the circulatory system: Secondary | ICD-10-CM | POA: Diagnosis not present

## 2019-03-14 DIAGNOSIS — Z888 Allergy status to other drugs, medicaments and biological substances status: Secondary | ICD-10-CM | POA: Diagnosis not present

## 2019-03-14 DIAGNOSIS — F319 Bipolar disorder, unspecified: Secondary | ICD-10-CM | POA: Diagnosis not present

## 2019-03-14 DIAGNOSIS — Z1159 Encounter for screening for other viral diseases: Secondary | ICD-10-CM | POA: Insufficient documentation

## 2019-03-14 DIAGNOSIS — R51 Headache: Secondary | ICD-10-CM | POA: Diagnosis not present

## 2019-03-14 DIAGNOSIS — K3189 Other diseases of stomach and duodenum: Secondary | ICD-10-CM | POA: Diagnosis not present

## 2019-03-14 DIAGNOSIS — R590 Localized enlarged lymph nodes: Secondary | ICD-10-CM | POA: Diagnosis not present

## 2019-03-14 DIAGNOSIS — M419 Scoliosis, unspecified: Secondary | ICD-10-CM | POA: Diagnosis not present

## 2019-03-14 DIAGNOSIS — I10 Essential (primary) hypertension: Secondary | ICD-10-CM | POA: Diagnosis not present

## 2019-03-14 DIAGNOSIS — F419 Anxiety disorder, unspecified: Secondary | ICD-10-CM | POA: Insufficient documentation

## 2019-03-14 DIAGNOSIS — Z818 Family history of other mental and behavioral disorders: Secondary | ICD-10-CM | POA: Diagnosis not present

## 2019-03-14 DIAGNOSIS — R1011 Right upper quadrant pain: Secondary | ICD-10-CM | POA: Insufficient documentation

## 2019-03-14 DIAGNOSIS — Z880 Allergy status to penicillin: Secondary | ICD-10-CM | POA: Diagnosis not present

## 2019-03-14 DIAGNOSIS — Z8379 Family history of other diseases of the digestive system: Secondary | ICD-10-CM | POA: Insufficient documentation

## 2019-03-14 DIAGNOSIS — Z800 Family history of malignant neoplasm of digestive organs: Secondary | ICD-10-CM | POA: Diagnosis not present

## 2019-03-14 DIAGNOSIS — R948 Abnormal results of function studies of other organs and systems: Secondary | ICD-10-CM

## 2019-03-14 HISTORY — PX: BIOPSY: SHX5522

## 2019-03-14 HISTORY — PX: EUS: SHX5427

## 2019-03-14 HISTORY — PX: ESOPHAGOGASTRODUODENOSCOPY (EGD) WITH PROPOFOL: SHX5813

## 2019-03-14 HISTORY — PX: FINE NEEDLE ASPIRATION: SHX5430

## 2019-03-14 LAB — SARS CORONAVIRUS 2 BY RT PCR (HOSPITAL ORDER, PERFORMED IN ~~LOC~~ HOSPITAL LAB): SARS Coronavirus 2: NEGATIVE

## 2019-03-14 SURGERY — ESOPHAGOGASTRODUODENOSCOPY (EGD) WITH PROPOFOL
Anesthesia: Monitor Anesthesia Care

## 2019-03-14 MED ORDER — ONDANSETRON HCL 8 MG PO TABS: 8.0000 mg | ORAL_TABLET | Freq: Three times a day (TID) | ORAL | 4 refills | Status: DC | PRN

## 2019-03-14 MED ORDER — LACTATED RINGERS IV SOLN: INTRAVENOUS | Status: DC

## 2019-03-14 MED ORDER — PANTOPRAZOLE SODIUM 40 MG PO TBEC: 40.0000 mg | DELAYED_RELEASE_TABLET | Freq: Every day | ORAL | 4 refills | Status: DC

## 2019-03-14 MED ORDER — PROPOFOL 500 MG/50ML IV EMUL
INTRAVENOUS | Status: DC | PRN
  Administered 2019-03-14: 100 ug/kg/min via INTRAVENOUS

## 2019-03-14 MED ORDER — PROPOFOL 10 MG/ML IV BOLUS
INTRAVENOUS | Status: DC | PRN
  Administered 2019-03-14: 20 mg via INTRAVENOUS
  Administered 2019-03-14 (×2): 40 mg via INTRAVENOUS

## 2019-03-14 MED ORDER — SODIUM CHLORIDE 0.9 % IV SOLN
INTRAVENOUS | Status: DC
  Administered 2019-03-14: 1000 mL via INTRAVENOUS

## 2019-03-14 SURGICAL SUPPLY — 15 items

## 2019-03-14 NOTE — Op Note (Signed)
[facility] Patient Name: Mary Hogan Procedure Date : 03/14/2019 MRN: [id number] Attending MD: Justice Britain , MD Date of Birth: [birth year] CSN: [id number] Age: 38 Admit Type: Outpatient Procedure:                Upper EUS Indications:              Abnormal abdominal/pelvic CT scan, Abdominal pain                            in the right upper quadrant Providers:                Justice Britain, MD, Vista Lawman, RN, Elspeth Cho Tech., Technician, Raphael Gibney, CRNA Referring MD:              Medicines:                Monitored Anesthesia Care Complications:            No immediate complications. Estimated Blood Loss:     Estimated blood loss was minimal. Procedure:                Pre-Anesthesia Assessment:                           - Prior to the procedure, a History and Physical                            was performed, and patient medications and                            allergies were reviewed. The patient's tolerance of                            previous anesthesia was also reviewed. The risks                            and benefits of the procedure and the sedation                            options and risks were discussed with the patient.                            All questions were answered, and informed consent                            was obtained. Prior Anticoagulants: The patient has                            taken no previous anticoagulant or antiplatelet                            agents. ASA Grade Assessment: II - A patient with  mild systemic disease. After reviewing the risks                            and benefits, the patient was deemed in                            satisfactory condition to undergo the procedure.                           After obtaining informed consent, the endoscope was                            passed under direct vision. Throughout the               procedure, the patient's blood pressure, pulse, and                            oxygen saturations were monitored continuously. The                            [id number]-H190 ([id number]) Olympus gastroscope was                            introduced through the mouth, and advanced to the                            second part of duodenum. The [id number] ([id number]                            Olympus Linear EUS scope was introduced through the                            mouth, and advanced to the duodenum for ultrasound                            examination from the stomach and duodenum. The                            upper EUS was accomplished without difficulty. The                            patient tolerated the procedure. Scope In: Scope Out: Findings:      ENDOSCOPIC FINDING: :      No gross lesions were noted in the entire esophagus.      Multiple dispersed, small non-bleeding erosions were found in the       gastric body. There were no stigmata of recent bleeding. Biopsies were       taken with a cold forceps for histology and Helicobacter pylori testing.      No gross lesions were noted in the duodenal bulb, in the first portion       of the duodenum and in the second portion of the duodenum.      ENDOSONOGRAPHIC FINDING: :      There was no sign of significant endosonographic abnormality in the  pancreatic head (PD = 1.5 mm), genu of the pancreas (1.4 mm -> 1.6 mm),       pancreatic body (PD = 0.6 mm) and pancreatic tail (PD = 0.5 mm). No       masses, no cysts, no calcifications, the pancreatic duct was thin in       caliber.      Endosonographic imaging in the common bile duct (4.4 mm), in the common       hepatic duct (5.8 mm) and in the gallbladder showed no stones, sludge or       dilation.      Two enlarged lymph nodes were visualized in the peripancreatic region       and porta hepatis region. The largest in the periporta measured 10 mm by       8 mm in maximal  cross-sectional diameter. The other node which was in       the peripancreatic region near the tail of the pancreas measured 10 mm       by 6 mm. The nodes were triangular, hypoechoic and had well defined       margins. Fine needle biopsy was performed of each. Color Doppler imaging       was utilized prior to needle puncture to confirm a lack of significant       vascular structures within the needle path. Three passes were made with       the 22 gauge ultrasound core biopsy needle using a transduodenal       approach to the periportal node. Visible cores of tissue were obtained.       A preliminary cytologic examination was performed. Final cytology       results are pending. Fine needle biopsy was performed. Color Doppler       imaging was utilized prior to needle puncture to confirm a lack of       significant vascular structures within the needle path. Three passes       were made with the 22 gauge ultrasound core biopsy needle using a       transgastric approach to the peripancreatic node. Visible cores of       tissue were obtained. A preliminary cytologic examination was performed.       Final cytology results are pending.      Endosonographic imaging in the visualized portion of the liver showed no       mass.      The celiac region was visualized. Impression:               EGD Impression:                           - No gross lesions in esophagus.                           - Non-bleeding erosive gastropathy. Biopsied.                           - No gross lesions in the duodenal bulb, in the                            first portion of the duodenum and in the second  portion of the duodenum.                           EUS Impression:                           - There was no sign of significant pathology in the                            pancreatic head, genu of the pancreas, pancreatic                            body and pancreatic tail.                            - Two enlarged lymph nodes were visualized in the                            peripancreatic region and porta hepatis region.                            Tissue was obtained from this exam, and results are                            pending. However, the endosonographic appearance is                            suspicious for benign inflammatory changes. Fine                            needle biopsy performed. Recommendation:           - The patient will be observed post-procedure,                            until all discharge criteria are met.                           - Discharge patient to home.                           - Patient has a contact number available for                            emergencies. The signs and symptoms of potential                            delayed complications were discussed with the                            patient. Return to normal activities tomorrow.                            Written discharge instructions were provided to the  patient.                           - Resume previous diet.                           - Observe patient's clinical course.                           - Restart PPI once daily (Rx sent).                           - Await cytology results and await path results.                           - Will consider repeat CT scan to follow previous                            changes.                           - Return to my office in 4 weeks.                           - The findings and recommendations were discussed                            with the patient. Procedure Code(s):        --- Professional ---                           [phone], Esophagogastroduodenoscopy, flexible,                            transoral; with transendoscopic ultrasound-guided                            intramural or transmural fine needle                            aspiration/biopsy(s), (includes endoscopic                             ultrasound examination limited to the esophagus,                            stomach or duodenum, and adjacent structures) Diagnosis Code(s):        --- Professional ---                           K31.89, Other diseases of stomach and duodenum                           R59.0, Localized enlarged lymph nodes                           R10.11, Right upper quadrant pain  R93.5, Abnormal findings on diagnostic imaging of                            other abdominal regions, including retroperitoneum CPT copyright 2019 [employer] Association. All rights reserved. The codes documented in this report are preliminary and upon coder review may  be revised to meet current compliance requirements. Justice Britain, MD 03/14/2019 3:08:29 PM Number of Addenda: 0

## 2019-03-14 NOTE — Discharge Instructions (Signed)
YOU HAD AN ENDOSCOPIC PROCEDURE TODAY: Refer to the procedure report and other information in the discharge instructions given to you for any specific questions about what was found during the examination. If this information does not answer your questions, please call [place] office at [phone] to clarify.   YOU SHOULD EXPECT: Some feelings of bloating in the abdomen. Passage of more gas than usual. Walking can help get rid of the air that was put into your GI tract during the procedure and reduce the bloating. If you had a lower endoscopy (such as a colonoscopy or flexible sigmoidoscopy) you may notice spotting of blood in your stool or on the toilet paper. Some abdominal soreness may be present for a day or two, also.  DIET: Your first meal following the procedure should be a light meal and then it is ok to progress to your normal diet. A half-sandwich or bowl of soup is an example of a good first meal. Heavy or fried foods are harder to digest and may make you feel nauseous or bloated. Drink plenty of fluids but you should avoid alcoholic beverages for 24 hours. If you had a esophageal dilation, please see attached instructions for diet.    ACTIVITY: Your care partner should take you home directly after the procedure. You should plan to take it easy, moving slowly for the rest of the day. You can resume normal activity the day after the procedure however YOU SHOULD NOT DRIVE, use power tools, machinery or perform tasks that involve climbing or major physical exertion for 24 hours (because of the sedation medicines used during the test).   SYMPTOMS TO REPORT IMMEDIATELY: A gastroenterologist can be reached at any hour. Please call [phone]  for any of the following symptoms:   Following upper endoscopy (EGD, EUS, ERCP, esophageal dilation) Vomiting of blood or coffee ground material  New, significant abdominal pain  New, significant chest pain or pain under the shoulder blades  Painful or  persistently difficult swallowing  New shortness of breath  Black, tarry-looking or red, bloody stools  FOLLOW UP:  If any biopsies were taken you will be contacted by phone or by letter within the next 1-3 weeks. Call [phone]  if you have not heard about the biopsies in 3 weeks.  Please also call with any specific questions about appointments or follow up tests.  

## 2019-03-14 NOTE — Transfer of Care (Signed)
Immediate Anesthesia Transfer of Care Note  Patient: Mary Hogan  Procedure(s) Performed: ESOPHAGOGASTRODUODENOSCOPY (EGD) WITH PROPOFOL (N/A ) UPPER ENDOSCOPIC ULTRASOUND (EUS) RADIAL (N/A ) BIOPSY FINE NEEDLE ASPIRATION (FNA) LINEAR  Patient Location: Endoscopy Unit  Anesthesia Type:MAC  Level of Consciousness: awake, alert  and oriented  Airway & Oxygen Therapy: Patient Spontanous Breathing and Patient connected to nasal cannula oxygen  Post-op Assessment: Report given to RN, Post -op Vital signs reviewed and stable and Patient moving all extremities  Post vital signs: Reviewed and stable  Last Vitals:  Vitals Value Taken Time  BP 90/73 03/14/19 1443  Temp    Pulse 62 03/14/19 1447  Resp 14 03/14/19 1447  SpO2 97 % 03/14/19 1447  Vitals shown include unvalidated device data.  Last Pain:  Vitals:   03/14/19 1440  TempSrc: Temporal  PainSc: 0-No pain         Complications: No apparent anesthesia complications

## 2019-03-14 NOTE — H&P (Signed)
GASTROENTEROLOGY OUTPATIENT PROCEDURE H&P NOTE   Primary Care Physician: [facility]  HPI: Mary Hogan is a 38 y.o. female who presents for EGD/EUS.  Past Medical History:  Diagnosis Date  . Abnormal Pap smear   . Anxiety   . Depression   . Headache(784.0)   . IBS (irritable bowel syndrome)   . Scoliosis    Past Surgical History:  Procedure Laterality Date  . BREAST ENHANCEMENT SURGERY  2008  . DILATION AND CURETTAGE OF UTERUS    . ENDOMETRIAL ABLATION    . LAPAROSCOPIC OVARIAN CYSTECTOMY  2012  . TUBAL LIGATION     Current Facility-Administered Medications  Medication Dose Route Frequency Provider Last Rate Last Dose  . 0.9 %  sodium chloride infusion   Intravenous Continuous Mansouraty, Telford Nab., MD 20 mL/hr at 03/14/19 1112 1,000 mL at 03/14/19 1112  . lactated ringers infusion   Intravenous Continuous Mansouraty, Telford Nab., MD       Allergies  Allergen Reactions  . Ketorolac Tromethamine Other (See Comments)    Stomach burning in stomach.  . Amoxicillin Hives    Has patient had a PCN reaction causing immediate rash, facial/tongue/throat swelling, SOB or lightheadedness with hypotension: Has patient had a PCN reaction causing severe rash involving mucus membranes or skin necrosis:  Has patient had a PCN reaction that required hospitalization  Has patient had a PCN reaction occurring within the last 10 years:  If all of the above answers are "NO", then may proceed with Cephalosporin use.   Marland Kitchen Penicillins Hives  . Tramadol Hives   Family History  Problem Relation Age of Onset  . Alzheimer's disease Mother   . Hypertension Mother   . Diabetes Mother   . Anxiety disorder Mother   . Depression Mother   . Post-traumatic stress disorder Mother   . Irritable bowel syndrome Mother   . Parkinson's disease Mother   . Diabetes Father   . Liver disease Maternal Grandfather   . Pancreatic cancer Maternal Uncle   . Stomach cancer  Maternal Uncle   . Diabetes Maternal Uncle   . Colon cancer Maternal Uncle   . Esophageal cancer Maternal Uncle   . Anesthesia problems Neg Hx   . Hypotension Neg Hx   . Malignant hyperthermia Neg Hx   . Pseudochol deficiency Neg Hx    Social History   Socioeconomic History  . Marital status: Single    Spouse name: Not on file  . Number of children: 4  . Years of education: Not on file  . Highest education level: Not on file  Occupational History  . Occupation: Psychologist, prison and probation services Needs  . Financial resource strain: Not on file  . Food insecurity    Worry: Not on file    Inability: Not on file  . Transportation needs    Medical: Not on file    Non-medical: Not on file  Tobacco Use  . Smoking status: Current Every Day Smoker    Packs/day: 1.00  . Smokeless tobacco: Never Used  Substance and Sexual Activity  . Alcohol use: Yes    Comment: occasional  . Drug use: Never  . Sexual activity: Yes    Birth control/protection: Surgical  Lifestyle  . Physical activity    Days per week: Not on file    Minutes per session: Not on file  . Stress: Not on file  Relationships  . Social connections    Talks on phone: Not on file  Gets together: Not on file    Attends religious service: Not on file    Active member of club or organization: Not on file    Attends meetings of clubs or organizations: Not on file    Relationship status: Not on file  . Intimate partner violence    Fear of current or ex partner: Not on file    Emotionally abused: Not on file    Physically abused: Not on file    Forced sexual activity: Not on file  Other Topics Concern  . Not on file  Social History Narrative  . Not on file    Physical Exam: Vital signs in last 24 hours: Temp:  [98.5 F (36.9 C)] 98.5 F (36.9 C) (07/06 1055) Resp:  [12] 12 (07/06 1055) BP: (104)/(62) 104/62 (07/06 1055) SpO2:  [98 %] 98 % (07/06 1055) Weight:  [74.8 kg] 74.8 kg (07/06 1055)   GEN: NAD EYE: Sclerae  anicteric ENT: MMM CV: RR without R/Gs  RESP: CTAB posteriorly GI: Soft, 4-5/10 pain NEURO:  Alert & Oriented x 3  Lab Results: No results for input(s): WBC, HGB, HCT, PLT in the last 72 hours. BMET No results for input(s): NA, K, CL, CO2, GLUCOSE, BUN, CREATININE, CALCIUM in the last 72 hours. LFT No results for input(s): PROT, ALBUMIN, AST, ALT, ALKPHOS, BILITOT, BILIDIR, IBILI in the last 72 hours. PT/INR No results for input(s): LABPROT, INR in the last 72 hours.   Impression / Plan: This is a 38 y.o.female who presents for EGD/EUS.  The risks of EUS including bleeding, infection, aspiration pneumonia and intestinal perforation were discussed as was the possibility it may not give a definitive diagnosis.  If a biopsy of the pancreas is done as part of the EUS, there is an additional risk of pancreatitis at the rate of about 1%.  It was explained that procedure related pancreatitis is typically mild, although can be severe and even life threatening, which is why we do not perform random pancreatic biopsies and only biopsy a lesion we feel is concerning enough to warrant the risk.   The risks and benefits of endoscopic evaluation were discussed with the patient; these include but are not limited to the risk of perforation, infection, bleeding, missed lesions, lack of diagnosis, severe illness requiring hospitalization, as well as anesthesia and sedation related illnesses.  The patient is agreeable to proceed.    Justice Britain, MD [facility] Office # [phone]

## 2019-03-14 NOTE — Anesthesia Preprocedure Evaluation (Signed)
Anesthesia Evaluation  Patient identified by MRN, date of birth, ID band Patient awake    Reviewed: Allergy & Precautions, NPO status , Patient's Chart, lab work & pertinent test results  Airway Mallampati: II  TM Distance: >3 FB     Dental  (+) Upper Dentures, Lower Dentures   Pulmonary Current Smoker,    Pulmonary exam normal breath sounds clear to auscultation       Cardiovascular hypertension, Normal cardiovascular exam+ Valvular Problems/Murmurs MVP  Rhythm:Regular Rate:Normal     Neuro/Psych  Headaches, PSYCHIATRIC DISORDERS Anxiety Depression Bipolar Disorder Schizophrenia ADDRestless legs syndrome    GI/Hepatic (+)     substance abuse  methamphetamine use, Abnormal pancreas on CT scan   Endo/Other  negative endocrine ROS  Renal/GU   negative genitourinary   Musculoskeletal negative musculoskeletal ROS (+) Scoliosis   Abdominal   Peds  Hematology negative hematology ROS (+)   Anesthesia Other Findings   Reproductive/Obstetrics                             Anesthesia Physical Anesthesia Plan  ASA: II  Anesthesia Plan: MAC   Post-op Pain Management:    Induction: Intravenous  PONV Risk Score and Plan: 1  Airway Management Planned: Natural Airway and Nasal Cannula  Additional Equipment:   Intra-op Plan:   Post-operative Plan:   Informed Consent: I have reviewed the patients History and Physical, chart, labs and discussed the procedure including the risks, benefits and alternatives for the proposed anesthesia with the patient or authorized representative who has indicated his/her understanding and acceptance.       Plan Discussed with: CRNA  Anesthesia Plan Comments:         Anesthesia Quick Evaluation

## 2019-03-15 ENCOUNTER — Encounter (HOSPITAL_COMMUNITY): Payer: Self-pay | Admitting: Gastroenterology

## 2019-03-15 NOTE — Anesthesia Postprocedure Evaluation (Signed)
Anesthesia Post Note  Patient: Mary Hogan  Procedure(s) Performed: ESOPHAGOGASTRODUODENOSCOPY (EGD) WITH PROPOFOL (N/A ) UPPER ENDOSCOPIC ULTRASOUND (EUS) RADIAL (N/A ) BIOPSY FINE NEEDLE ASPIRATION (FNA) LINEAR     Patient location during evaluation: Other Anesthesia Type: MAC Level of consciousness: awake and alert Pain management: pain level controlled Vital Signs Assessment: post-procedure vital signs reviewed and stable Respiratory status: spontaneous breathing, nonlabored ventilation and respiratory function stable Cardiovascular status: stable and blood pressure returned to baseline Postop Assessment: no apparent nausea or vomiting Anesthetic complications: no    Last Vitals:  Vitals:   03/14/19 1455 03/14/19 1510  BP: (!) 81/53 (!) 94/53  Pulse: 61 62  Resp: 14 12  Temp:    SpO2: 99% 100%    Last Pain:  Vitals:   03/14/19 1510  TempSrc:   PainSc: 0-No pain                 Courtney Bellizzi,W. EDMOND

## 2019-03-20 ENCOUNTER — Encounter: Payer: Self-pay | Admitting: Gastroenterology

## 2019-04-26 ENCOUNTER — Ambulatory Visit: Payer: Medicaid Other | Admitting: Gastroenterology

## 2019-05-17 ENCOUNTER — Ambulatory Visit: Payer: Medicaid Other | Admitting: Gastroenterology

## 2019-05-20 ENCOUNTER — Encounter (HOSPITAL_COMMUNITY): Payer: Self-pay | Admitting: *Deleted

## 2019-05-20 ENCOUNTER — Other Ambulatory Visit: Payer: Self-pay

## 2019-05-20 DIAGNOSIS — Z5321 Procedure and treatment not carried out due to patient leaving prior to being seen by health care provider: Secondary | ICD-10-CM | POA: Insufficient documentation

## 2019-05-20 DIAGNOSIS — R109 Unspecified abdominal pain: Secondary | ICD-10-CM | POA: Insufficient documentation

## 2019-05-20 MED ORDER — SODIUM CHLORIDE 0.9% FLUSH: 3.0000 mL | Freq: Once | INTRAVENOUS | Status: DC

## 2019-05-20 NOTE — ED Notes (Signed)
Urine culture sent to the lab. 

## 2019-05-20 NOTE — ED Triage Notes (Signed)
Pt arrives via EMS with "sharp" abdominal pain (4 weeks) and headache. Nausea and vomiting. VSS en route

## 2019-05-21 ENCOUNTER — Emergency Department (HOSPITAL_COMMUNITY)
Admission: EM | Admit: 2019-05-21 | Discharge: 2019-05-21 | Disposition: A | Discharge status: Home / Self Care | Payer: Medicaid Other | Attending: Emergency Medicine | Admitting: Emergency Medicine

## 2019-05-21 LAB — COMPREHENSIVE METABOLIC PANEL
ALT: 24 U/L (ref 0–44)
AST: 20 U/L (ref 15–41)
Albumin: 4.3 g/dL (ref 3.5–5.0)
Alkaline Phosphatase: 71 U/L (ref 38–126)
Anion gap: 13 (ref 5–15)
BUN: 14 mg/dL (ref 6–20)
CO2: 24 mmol/L (ref 22–32)
Calcium: 9 mg/dL (ref 8.9–10.3)
Chloride: 102 mmol/L (ref 98–111)
Creatinine, Ser: 0.99 mg/dL (ref 0.44–1.00)
GFR calc Af Amer: >60 mL/min (ref >60)
GFR calc non Af Amer: >60 mL/min (ref >60)
Glucose, Bld: 95 mg/dL (ref 70–99)
Potassium: 3.4 mmol/L — ABNORMAL LOW (ref 3.5–5.1)
Sodium: 139 mmol/L (ref 135–145)
Total Bilirubin: 0.5 mg/dL (ref 0.3–1.2)
Total Protein: 7.6 g/dL (ref 6.5–8.1)

## 2019-05-21 LAB — URINALYSIS, ROUTINE W REFLEX MICROSCOPIC
Bilirubin Urine: NEGATIVE
Glucose, UA: NEGATIVE mg/dL
Ketones, ur: NEGATIVE mg/dL
Nitrite: NEGATIVE
Protein, ur: NEGATIVE mg/dL
Specific Gravity, Urine: 1.01 (ref 1.005–1.030)
pH: 6 (ref 5.0–8.0)

## 2019-05-21 LAB — CBC
HCT: 43.1 % (ref 36.0–46.0)
Hemoglobin: 14.2 g/dL (ref 12.0–15.0)
MCH: 30.5 pg (ref 26.0–34.0)
MCHC: 32.9 g/dL (ref 30.0–36.0)
MCV: 92.5 fL (ref 80.0–100.0)
Platelets: 286 10*3/uL (ref 150–400)
RBC: 4.66 MIL/uL (ref 3.87–5.11)
RDW: 12 % (ref 11.5–15.5)
WBC: 9.4 10*3/uL (ref 4.0–10.5)
nRBC: 0 % (ref 0.0–0.2)

## 2019-05-21 LAB — I-STAT BETA HCG BLOOD, ED (MC, WL, AP ONLY): I-stat hCG, quantitative: <5 m[IU]/mL (ref <5)

## 2019-05-21 LAB — LIPASE, BLOOD: Lipase: 28 U/L (ref 11–51)

## 2019-06-02 ENCOUNTER — Ambulatory Visit: Payer: Medicaid Other | Admitting: Gastroenterology

## 2019-06-30 ENCOUNTER — Ambulatory Visit: Payer: Medicaid Other | Admitting: Gastroenterology

## 2019-08-10 ENCOUNTER — Encounter (HOSPITAL_COMMUNITY): Payer: Self-pay | Admitting: Emergency Medicine

## 2019-08-10 ENCOUNTER — Ambulatory Visit (HOSPITAL_COMMUNITY)
Admission: EM | Admit: 2019-08-10 | Discharge: 2019-08-10 | Disposition: A | Discharge status: Home / Self Care | Payer: Medicaid Other | Attending: Family Medicine | Admitting: Family Medicine

## 2019-08-10 ENCOUNTER — Other Ambulatory Visit: Payer: Self-pay

## 2019-08-10 DIAGNOSIS — N12 Tubulo-interstitial nephritis, not specified as acute or chronic: Secondary | ICD-10-CM | POA: Insufficient documentation

## 2019-08-10 LAB — POCT URINALYSIS DIP (DEVICE)
Glucose, UA: 100 mg/dL — AB
Ketones, ur: 15 mg/dL — AB
Nitrite: POSITIVE — AB
Protein, ur: >=300 mg/dL — AB
Specific Gravity, Urine: 1.015 (ref 1.005–1.030)
Urobilinogen, UA: >=8 mg/dL (ref 0.0–1.0)
pH: 5 (ref 5.0–8.0)

## 2019-08-10 MED ORDER — ONDANSETRON 4 MG PO TBDP: 4.0000 mg | ORAL_TABLET | Freq: Three times a day (TID) | ORAL | 0 refills | Status: DC | PRN

## 2019-08-10 MED ORDER — SULFAMETHOXAZOLE-TRIMETHOPRIM 800-160 MG PO TABS: 1.0000 | ORAL_TABLET | Freq: Two times a day (BID) | ORAL | 0 refills | Status: AC

## 2019-08-10 NOTE — ED Triage Notes (Signed)
Patient reports frequency, urgency for 2 weeks, burning with urination.  Patient has nausea, left lower back pain

## 2019-08-11 NOTE — ED Provider Notes (Signed)
[place]    ASSESSMENT & PLAN:  1. Pyelonephritis     No indication for hospitalization at this point. Will begin antibiotic this evening. Encouraged her to do her best with hydration. Tolerating sips of PO fluids. No signs of sepsis. She agrees to have a low threshold for return should her symptoms worsen over the next 24-48 hours.  Begin asap: Meds ordered this encounter  Medications  . sulfamethoxazole-trimethoprim (BACTRIM DS) 800-160 MG tablet    Sig: Take 1 tablet by mouth 2 (two) times daily for 7 days.    Dispense:  14 tablet    Refill:  0  . ondansetron (ZOFRAN-ODT) 4 MG disintegrating tablet    Sig: Take 1 tablet (4 mg total) by mouth every 8 (eight) hours as needed for nausea or vomiting.    Dispense:  15 tablet    Refill:  0    Urine culture sent. Will notify patient when results available.  Follow-up Information    [facility].   Specialty: Urgent Care Why: If worsening or failing to improve as anticipated. Contact information: [place] [phone]         Outlined signs and symptoms indicating need for more acute intervention. Patient verbalized understanding. After Visit Summary given.  SUBJECTIVE:  Mary Hogan is a 38 y.o. female who complains of urinary frequency, urgency and dysuria for the past 1-2 weeks. Gradual onset of left lower flank discomfort over the past several days "and I feel like crap". Much fatigue. Gross hematuria noted over the past day or two. OTC AZO with some relief of dysuria. No vaginal discharge or specific abdominal pain reported. "Feel chilled now". Questions subjective fever. Nausea worse recently. No emesis. Overall decreased PO intake.  Ambulatory without difficulty. H/O UTI: rare.  LMP: No LMP recorded. Patient has had an ablation.  ROS: As in HPI. All other systems negative.   OBJECTIVE:  Vitals:   08/10/19 1956   BP: 119/79  Pulse: 92  Resp: 16  Temp: 99 F (37.2 C)  TempSrc: Oral  SpO2: 97%   General appearance: alert; no distress; appears very fatigued HENT: oropharynx: slightly dry; eyes moist CV: RRR Lungs: unlabored respirations Abdomen: soft, non-tender; bowel sounds normal; no masses or organomegaly; no guarding or rebound tenderness Back: L sided CVA tenderness Extremities: no edema; symmetrical with no gross deformities Skin: warm and dry Neurologic: normal gait Psychological: alert and cooperative; normal mood and affect  Labs Reviewed  POCT URINALYSIS DIP (DEVICE) - Abnormal; Notable for the following components:      Result Value   Glucose, UA 100 (*)    Bilirubin Urine MODERATE (*)    Ketones, ur 15 (*)    Hgb urine dipstick TRACE (*)    Protein, ur >=300 (*)    Nitrite POSITIVE (*)    Leukocytes,Ua LARGE (*)    All other components within normal limits  URINE CULTURE    Allergies  Allergen Reactions  . Ketorolac Tromethamine Other (See Comments)    Stomach burning in stomach.  . Amoxicillin Hives  . Penicillins Hives  . Tramadol Hives    Past Medical History:  Diagnosis Date  . Abnormal Pap smear   . Anxiety   . Depression   . Headache(784.0)   . IBS (irritable bowel syndrome)   . Scoliosis    Social History   Socioeconomic History  . Marital status: Single    Spouse name:  Not on file  . Number of children: 4  . Years of education: Not on file  . Highest education level: Not on file  Occupational History  . Occupation: Psychologist, prison and probation services Needs  . Financial resource strain: Not on file  . Food insecurity    Worry: Not on file    Inability: Not on file  . Transportation needs    Medical: Not on file    Non-medical: Not on file  Tobacco Use  . Smoking status: Current Every Day Smoker    Packs/day: 1.00  . Smokeless tobacco: Never Used  Substance and Sexual Activity  . Alcohol use: Yes    Comment: occasional  . Drug use: Never  . Sexual  activity: Yes    Birth control/protection: Surgical  Lifestyle  . Physical activity    Days per week: Not on file    Minutes per session: Not on file  . Stress: Not on file  Relationships  . Social Herbalist on phone: Not on file    Gets together: Not on file    Attends religious service: Not on file    Active member of club or organization: Not on file    Attends meetings of clubs or organizations: Not on file    Relationship status: Not on file  . Intimate partner violence    Fear of current or ex partner: Not on file    Emotionally abused: Not on file    Physically abused: Not on file    Forced sexual activity: Not on file  Other Topics Concern  . Not on file  Social History Narrative  . Not on file   Family History  Problem Relation Age of Onset  . Alzheimer's disease Mother   . Hypertension Mother   . Diabetes Mother   . Anxiety disorder Mother   . Depression Mother   . Post-traumatic stress disorder Mother   . Irritable bowel syndrome Mother   . Parkinson's disease Mother   . Diabetes Father   . Liver disease Maternal Grandfather   . Pancreatic cancer Maternal Uncle   . Stomach cancer Maternal Uncle   . Diabetes Maternal Uncle   . Colon cancer Maternal Uncle   . Esophageal cancer Maternal Uncle   . Anesthesia problems Neg Hx   . Hypotension Neg Hx   . Malignant hyperthermia Neg Hx   . Pseudochol deficiency Neg Hx        Vanessa Kick, MD 08/11/19 [phone]

## 2019-08-13 LAB — URINE CULTURE: Culture: >=100000 — AB

## 2019-09-21 ENCOUNTER — Ambulatory Visit (HOSPITAL_COMMUNITY)
Admission: EM | Admit: 2019-09-21 | Discharge: 2019-09-21 | Disposition: A | Discharge status: Home / Self Care | Payer: Medicaid Other | Attending: Urgent Care | Admitting: Urgent Care

## 2019-09-21 ENCOUNTER — Ambulatory Visit
Admission: EM | Admit: 2019-09-21 | Discharge: 2019-09-21 | Disposition: A | Discharge status: Home / Self Care | Payer: Medicaid Other

## 2019-09-21 ENCOUNTER — Encounter (HOSPITAL_COMMUNITY): Payer: Self-pay

## 2019-09-21 ENCOUNTER — Other Ambulatory Visit: Payer: Self-pay

## 2019-09-21 DIAGNOSIS — N898 Other specified noninflammatory disorders of vagina: Secondary | ICD-10-CM | POA: Diagnosis not present

## 2019-09-21 LAB — POCT URINALYSIS DIP (DEVICE)
Bilirubin Urine: NEGATIVE
Glucose, UA: NEGATIVE mg/dL
Hgb urine dipstick: NEGATIVE
Ketones, ur: NEGATIVE mg/dL
Leukocytes,Ua: NEGATIVE
Nitrite: NEGATIVE
Protein, ur: NEGATIVE mg/dL
Specific Gravity, Urine: 1.025 (ref 1.005–1.030)
Urobilinogen, UA: 2 mg/dL — ABNORMAL HIGH (ref 0.0–1.0)
pH: 7 (ref 5.0–8.0)

## 2019-09-21 NOTE — Discharge Instructions (Addendum)
Your urine did not show any infection I believe this may be a vaginal infection.  We will call with any positive results.

## 2019-09-21 NOTE — ED Provider Notes (Signed)
[facility]    CSN: [id number] Arrival date & time: 09/21/19  [id number]      History   Chief Complaint Chief Complaint  Patient presents with  . Urinary Tract Infection    HPI Mary Hogan is a 39 y.o. female.   Patient is a 39 year old female with past history of anxiety, depression, IBS, scoliosis.  She presents today with dysuria and vaginal irritation, itching.  Symptoms have been constant over the past few days.  She has had some lower back discomfort.  Treated for pyelonephritis on 08/10/2019.  Reporting symptoms did improve from this.  Denies any fever, chills, body aches, nausea, vomiting.  Denies currently being sexually active for any concern for STDs.No LMP recorded. Patient has had an ablation.  ROS per HPI    Urinary Tract Infection   Past Medical History:  Diagnosis Date  . Abnormal Pap smear   . Anxiety   . Depression   . Headache(784.0)   . IBS (irritable bowel syndrome)   . Scoliosis     Patient Active Problem List   Diagnosis Date Noted  . Nausea without vomiting 12/26/2018  . Abdominal pain 09/24/2018  . Abnormal CT of the abdomen 09/24/2018  . Other constipation 09/24/2018  . Change in bowel habits 09/24/2018  . Anxiety 07/07/2018  . Encounter for blood transfusion 07/07/2018  . Scoliosis 07/07/2018  . Psychotic disorder ([place]) 01/20/2018  . Methamphetamine abuse ([place]) 10/27/2017  . UTI due to extended-spectrum beta lactamase (ESBL) producing Escherichia coli 06/09/2016  . Bipolar affective disorder, currently manic, moderate ([place]) 06/05/2016  . URI (upper respiratory infection) 12/23/2013  . Attention deficit disorder 06/14/2013  . Generalized anxiety disorder 06/14/2013  . Essential (primary) hypertension 05/02/2013  . SOB (shortness of breath) 05/02/2013  . Chest pain 03/08/2012  . MVP (mitral valve prolapse) 03/08/2012  . Palpitations 03/08/2012  . Hx of migraine headaches 02/19/2012  . Polysubstance overdose 12/25/2011    . Bipolar disorder current episode depressed ([place]) 12/06/2011    Class: Acute  . IBS (irritable bowel syndrome) 10/13/2011  . Epigastric pain 08/27/2011  . Chronic pain 03/05/2011  . Migraine 03/05/2011  . TOBACCO USER 05/26/2009  . AMENORRHEA, SECONDARY 01/26/2007  . DEPRESSIVE DISORDER, NOS 11/05/2006  . RESTLESS LEGS SYNDROME 11/05/2006  . PAPANICOLAOU SMEAR, ABNORMAL 11/05/2006  . PAPANICOLAOU SMEAR, ABNORMAL 11/05/2006    Past Surgical History:  Procedure Laterality Date  . BIOPSY  03/14/2019   Procedure: BIOPSY;  Surgeon: Rush Landmark Telford Nab., MD;  Location: [facility];  Service: Gastroenterology;;  . BREAST ENHANCEMENT SURGERY  2008  . DILATION AND CURETTAGE OF UTERUS    . ENDOMETRIAL ABLATION    . ESOPHAGOGASTRODUODENOSCOPY (EGD) WITH PROPOFOL N/A 03/14/2019   Procedure: ESOPHAGOGASTRODUODENOSCOPY (EGD) WITH PROPOFOL;  Surgeon: Rush Landmark Telford Nab., MD;  Location: [facility];  Service: Gastroenterology;  Laterality: N/A;  . EUS N/A 03/14/2019   Procedure: UPPER ENDOSCOPIC ULTRASOUND (EUS) RADIAL;  Surgeon: Rush Landmark Telford Nab., MD;  Location: [facility];  Service: Gastroenterology;  Laterality: N/A;  . FINE NEEDLE ASPIRATION  03/14/2019   Procedure: FINE NEEDLE ASPIRATION (FNA) LINEAR;  Surgeon: Irving Copas., MD;  Location: [facility];  Service: Gastroenterology;;  . LAPAROSCOPIC OVARIAN CYSTECTOMY  2012  . TUBAL LIGATION      OB History    Gravida  9   Para  4   Term  2   Preterm  2   AB  5   Living  4     SAB  2  TAB  2   Ectopic  1   Multiple      Live Births               Home Medications    Prior to Admission medications   Medication Sig Start Date End Date Taking? Authorizing Provider  clonazePAM (KLONOPIN) 0.5 MG tablet Take 0.5 mg by mouth at bedtime.    [provider]  ondansetron (ZOFRAN-ODT) 4 MG disintegrating tablet Take 1 tablet (4 mg total) by mouth every 8 (eight) hours as needed for nausea or  vomiting. 08/10/19   Vanessa Kick, MD  pantoprazole (PROTONIX) 40 MG tablet Take 1 tablet (40 mg total) by mouth daily. 03/14/19 03/13/20  Mansouraty, Telford Nab., MD  TRAZODONE HCL PO Take by mouth.    [provider]  modafinil (PROVIGIL) 100 MG tablet Take 50-100 mg by mouth See admin instructions. Take 1/2 to 1 tablet every morning and at noon 05/13/18 08/10/19  [provider]    Family History Family History  Problem Relation Age of Onset  . Alzheimer's disease Mother   . Hypertension Mother   . Diabetes Mother   . Anxiety disorder Mother   . Depression Mother   . Post-traumatic stress disorder Mother   . Irritable bowel syndrome Mother   . Parkinson's disease Mother   . Diabetes Father   . Liver disease Maternal Grandfather   . Pancreatic cancer Maternal Uncle   . Stomach cancer Maternal Uncle   . Diabetes Maternal Uncle   . Colon cancer Maternal Uncle   . Esophageal cancer Maternal Uncle   . Anesthesia problems Neg Hx   . Hypotension Neg Hx   . Malignant hyperthermia Neg Hx   . Pseudochol deficiency Neg Hx     Social History Social History   Tobacco Use  . Smoking status: Current Every Day Smoker    Packs/day: 1.00  . Smokeless tobacco: Never Used  Substance Use Topics  . Alcohol use: Yes    Comment: occasional  . Drug use: Never     Allergies   Ketorolac tromethamine, Amoxicillin, Penicillins, and Tramadol   Review of Systems Review of Systems   Physical Exam Triage Vital Signs ED Triage Vitals  Enc Vitals Group     BP 09/21/19 0827 120/80     Pulse Rate 09/21/19 0827 83     Resp 09/21/19 0827 16     Temp 09/21/19 0827 98.9 F (37.2 C)     Temp Source 09/21/19 0827 Oral     SpO2 09/21/19 0827 98 %     Weight 09/21/19 0826 170 lb (77.1 kg)     Height --      Head Circumference --      Peak Flow --      Pain Score 09/21/19 0826 8     Pain Loc --      Pain Edu? --      Excl. in [place]? --    No data found.  Updated Vital Signs BP  120/80 (BP Location: Right Arm)   Pulse 83   Temp 98.9 F (37.2 C) (Oral)   Resp 16   Wt 170 lb (77.1 kg)   SpO2 98%   BMI 27.44 kg/m   Visual Acuity Right Eye Distance:   Left Eye Distance:   Bilateral Distance:    Right Eye Near:   Left Eye Near:    Bilateral Near:     Physical Exam Vitals and nursing note reviewed.  Constitutional:  General: She is not in acute distress.    Appearance: Normal appearance. She is not ill-appearing, toxic-appearing or diaphoretic.  HENT:     Head: Normocephalic.     Nose: Nose normal.     Mouth/Throat:     Pharynx: Oropharynx is clear.  Eyes:     Conjunctiva/sclera: Conjunctivae normal.  Pulmonary:     Effort: Pulmonary effort is normal.  Abdominal:     Palpations: Abdomen is soft.     Tenderness: There is no abdominal tenderness.  Musculoskeletal:        General: Normal range of motion.     Cervical back: Normal range of motion.     Lumbar back: Tenderness present.       Back:  Skin:    General: Skin is warm and dry.     Findings: No rash.  Neurological:     Mental Status: She is alert.  Psychiatric:        Mood and Affect: Mood normal.      UC Treatments / Results  Labs (all labs ordered are listed, but only abnormal results are displayed) Labs Reviewed  POCT URINALYSIS DIP (DEVICE) - Abnormal; Notable for the following components:      Result Value   Urobilinogen, UA 2.0 (*)    All other components within normal limits  CERVICOVAGINAL ANCILLARY ONLY    EKG   Radiology No results found.  Procedures Procedures (including critical care time)  Medications Ordered in UC Medications - No data to display  Initial Impression / Assessment and Plan / UC Course  I have reviewed the triage vital signs and the nursing notes.  Pertinent labs & imaging results that were available during my care of the patient were reviewed by me and considered in my medical decision making (see chart for details).     Vaginal  irritation and dysuria.  Urine negative for infection. Sending swab for further testing We will treat based on results  Final Clinical Impressions(s) / UC Diagnoses   Final diagnoses:  Vaginal irritation     Discharge Instructions     Your urine did not show any infection I believe this may be a vaginal infection.  We will call with any positive results.     ED Prescriptions    None     I have reviewed the PDMP during this encounter.   Orvan July, NP 09/21/19 [phone]

## 2019-09-21 NOTE — ED Triage Notes (Signed)
Pt states she still has a issues with her back and thinks she still has a UTI.

## 2019-09-23 ENCOUNTER — Telehealth (HOSPITAL_COMMUNITY): Payer: Self-pay | Admitting: Emergency Medicine

## 2019-09-23 LAB — CERVICOVAGINAL ANCILLARY ONLY
Bacterial vaginitis: NEGATIVE
Candida vaginitis: POSITIVE — AB
Chlamydia: NEGATIVE
Neisseria Gonorrhea: NEGATIVE
Trichomonas: NEGATIVE

## 2019-09-23 MED ORDER — FLUCONAZOLE 150 MG PO TABS: 150.0000 mg | ORAL_TABLET | Freq: Once | ORAL | 0 refills | Status: AC

## 2019-09-23 NOTE — Telephone Encounter (Signed)
Test for candida (yeast) was positive.  Prescription for fluconazole 150mg  po now, repeat dose in 3d if needed, #2 no refills, sent to the pharmacy of record.  Recheck or followup with PCP for further evaluation if symptoms are not improving.    Attempted to reach patient. No answer at this time. Voicemail full.

## 2019-09-26 ENCOUNTER — Telehealth (HOSPITAL_COMMUNITY): Payer: Self-pay | Admitting: Emergency Medicine

## 2019-09-26 NOTE — Telephone Encounter (Signed)
Attempted to reach patient x2. No answer at this time. Voicemailbox is full.

## 2019-10-18 ENCOUNTER — Other Ambulatory Visit: Payer: Self-pay

## 2019-10-18 ENCOUNTER — Ambulatory Visit (HOSPITAL_COMMUNITY)
Admission: EM | Admit: 2019-10-18 | Discharge: 2019-10-18 | Discharge status: Against Medical Advice | Payer: Medicaid Other

## 2019-10-18 ENCOUNTER — Encounter (HOSPITAL_COMMUNITY): Payer: Self-pay | Admitting: Emergency Medicine

## 2019-10-18 ENCOUNTER — Emergency Department (HOSPITAL_COMMUNITY)
Admission: EM | Admit: 2019-10-18 | Discharge: 2019-10-19 | Disposition: A | Discharge status: Home / Self Care | Payer: Medicaid Other | Attending: Emergency Medicine | Admitting: Emergency Medicine

## 2019-10-18 ENCOUNTER — Emergency Department (HOSPITAL_COMMUNITY): Payer: Medicaid Other

## 2019-10-18 DIAGNOSIS — I10 Essential (primary) hypertension: Secondary | ICD-10-CM | POA: Insufficient documentation

## 2019-10-18 DIAGNOSIS — R8290 Unspecified abnormal findings in urine: Secondary | ICD-10-CM | POA: Insufficient documentation

## 2019-10-18 DIAGNOSIS — F17210 Nicotine dependence, cigarettes, uncomplicated: Secondary | ICD-10-CM | POA: Insufficient documentation

## 2019-10-18 DIAGNOSIS — R300 Dysuria: Secondary | ICD-10-CM | POA: Insufficient documentation

## 2019-10-18 DIAGNOSIS — Z79899 Other long term (current) drug therapy: Secondary | ICD-10-CM | POA: Diagnosis not present

## 2019-10-18 DIAGNOSIS — R109 Unspecified abdominal pain: Secondary | ICD-10-CM | POA: Insufficient documentation

## 2019-10-18 LAB — URINALYSIS, ROUTINE W REFLEX MICROSCOPIC

## 2019-10-18 LAB — WET PREP, GENITAL
Sperm: NONE SEEN
Trich, Wet Prep: NONE SEEN
Yeast Wet Prep HPF POC: NONE SEEN

## 2019-10-18 LAB — CBC
HCT: 39.6 % (ref 36.0–46.0)
Hemoglobin: 12.5 g/dL (ref 12.0–15.0)
MCH: 30.2 pg (ref 26.0–34.0)
MCHC: 31.6 g/dL (ref 30.0–36.0)
MCV: 95.7 fL (ref 80.0–100.0)
Platelets: 210 10*3/uL (ref 150–400)
RBC: 4.14 MIL/uL (ref 3.87–5.11)
RDW: 12.7 % (ref 11.5–15.5)
WBC: 8 10*3/uL (ref 4.0–10.5)
nRBC: 0 % (ref 0.0–0.2)

## 2019-10-18 LAB — BASIC METABOLIC PANEL
Anion gap: 10 (ref 5–15)
BUN: 16 mg/dL (ref 6–20)
CO2: 28 mmol/L (ref 22–32)
Calcium: 8.7 mg/dL — ABNORMAL LOW (ref 8.9–10.3)
Chloride: 101 mmol/L (ref 98–111)
Creatinine, Ser: 0.95 mg/dL (ref 0.44–1.00)
GFR calc Af Amer: >60 mL/min (ref >60)
GFR calc non Af Amer: >60 mL/min (ref >60)
Glucose, Bld: 114 mg/dL — ABNORMAL HIGH (ref 70–99)
Potassium: 4 mmol/L (ref 3.5–5.1)
Sodium: 139 mmol/L (ref 135–145)

## 2019-10-18 LAB — POC URINE PREG, ED: Preg Test, Ur: NEGATIVE

## 2019-10-18 NOTE — ED Notes (Signed)
Pt went to c-t 

## 2019-10-18 NOTE — ED Triage Notes (Signed)
Pt arrives to ED from home with complaints of dysuria for the last couple of months. Patient states she's been to UC and dx with pyelonephritis but oral antibiotics are not working. Patient referred here for Iv antibiotics, lab work, and possible CT scan.

## 2019-10-18 NOTE — ED Provider Notes (Signed)
Patient evaluated in triage.  Has worsening pelvic pains, flank pain and low back pain.  She has been seen at her clinic twice and treated for pyelonephritis.  She has frank blood in her urine is seen in urine sample collection.  Patient reports that her pain is severe and nothing has helped.  Recommended patient report to the emergency room for evaluation, consideration for CT scan, further lab work given that she has not responded to treatment at our clinic.  Patient was agreeable to this.   Jaynee Eagles, PA-C 10/18/19 1740

## 2019-10-18 NOTE — ED Provider Notes (Signed)
[facility] EMERGENCY DEPARTMENT Provider Note   CSN: [id number] Arrival date & time: 10/18/19  1731     History Chief Complaint  Patient presents with  . Dysuria    Mary Hogan is a 39 y.o. female.  39 year old female presents with complaint of low back pain, dysuria, frequency/urgency and frank hematuria (reports urine has been red since onset of symptoms in December, has been taking AZO since that time which is no longer helping).  Patient reports symptoms began on December 2, went to urgent care and was diagnosed with pyelonephritis, treated with Septra.  Urine culture grew greater than 100,000 E. coli which was pansensitive.  Patient reports symptoms continued.  Patient went to the urgent care again last month for ongoing symptoms (review of records show symptoms had resolved at that time) patient had a pelvic exam which was positive for yeast and she was treated with Diflucan.  Patient reports ongoing symptoms including ongoing vaginal discomfort, went to urgent care today and was referred to the emergency room for further evaluation.  She denies vaginal bleeding or discharge, changes in bowel habits, fevers, chills or vomiting.  Patient states that she is nauseous.  Patient is not anticoagulated, has not had this problem in the past.  No other complaints or concerns today.        Past Medical History:  Diagnosis Date  . Abnormal Pap smear   . Anxiety   . Depression   . Headache(784.0)   . IBS (irritable bowel syndrome)   . Scoliosis     Patient Active Problem List   Diagnosis Date Noted  . Nausea without vomiting 12/26/2018  . Abdominal pain 09/24/2018  . Abnormal CT of the abdomen 09/24/2018  . Other constipation 09/24/2018  . Change in bowel habits 09/24/2018  . Anxiety 07/07/2018  . Encounter for blood transfusion 07/07/2018  . Scoliosis 07/07/2018  . Psychotic disorder ([place]) 01/20/2018  . Methamphetamine abuse ([place]) 10/27/2017  . UTI due  to extended-spectrum beta lactamase (ESBL) producing Escherichia coli 06/09/2016  . Bipolar affective disorder, currently manic, moderate ([place]) 06/05/2016  . URI (upper respiratory infection) 12/23/2013  . Attention deficit disorder 06/14/2013  . Generalized anxiety disorder 06/14/2013  . Essential (primary) hypertension 05/02/2013  . SOB (shortness of breath) 05/02/2013  . Chest pain 03/08/2012  . MVP (mitral valve prolapse) 03/08/2012  . Palpitations 03/08/2012  . Hx of migraine headaches 02/19/2012  . Polysubstance overdose 12/25/2011  . Bipolar disorder current episode depressed ([place]) 12/06/2011    Class: Acute  . IBS (irritable bowel syndrome) 10/13/2011  . Epigastric pain 08/27/2011  . Chronic pain 03/05/2011  . Migraine 03/05/2011  . TOBACCO USER 05/26/2009  . AMENORRHEA, SECONDARY 01/26/2007  . DEPRESSIVE DISORDER, NOS 11/05/2006  . RESTLESS LEGS SYNDROME 11/05/2006  . PAPANICOLAOU SMEAR, ABNORMAL 11/05/2006  . PAPANICOLAOU SMEAR, ABNORMAL 11/05/2006    Past Surgical History:  Procedure Laterality Date  . BIOPSY  03/14/2019   Procedure: BIOPSY;  Surgeon: Rush Landmark Telford Nab., MD;  Location: [facility];  Service: Gastroenterology;;  . BREAST ENHANCEMENT SURGERY  2008  . DILATION AND CURETTAGE OF UTERUS    . ENDOMETRIAL ABLATION    . ESOPHAGOGASTRODUODENOSCOPY (EGD) WITH PROPOFOL N/A 03/14/2019   Procedure: ESOPHAGOGASTRODUODENOSCOPY (EGD) WITH PROPOFOL;  Surgeon: Rush Landmark Telford Nab., MD;  Location: [facility];  Service: Gastroenterology;  Laterality: N/A;  . EUS N/A 03/14/2019   Procedure: UPPER ENDOSCOPIC ULTRASOUND (EUS) RADIAL;  Surgeon: Rush Landmark Telford Nab., MD;  Location: [facility];  Service: Gastroenterology;  Laterality: N/A;  . FINE NEEDLE ASPIRATION  03/14/2019   Procedure: FINE NEEDLE ASPIRATION (FNA) LINEAR;  Surgeon: Irving Copas., MD;  Location: [facility];  Service: Gastroenterology;;  . LAPAROSCOPIC OVARIAN CYSTECTOMY  2012  . TUBAL  LIGATION       OB History    Gravida  9   Para  4   Term  2   Preterm  2   AB  5   Living  4     SAB  2   TAB  2   Ectopic  1   Multiple      Live Births              Family History  Problem Relation Age of Onset  . Alzheimer's disease Mother   . Hypertension Mother   . Diabetes Mother   . Anxiety disorder Mother   . Depression Mother   . Post-traumatic stress disorder Mother   . Irritable bowel syndrome Mother   . Parkinson's disease Mother   . Diabetes Father   . Liver disease Maternal Grandfather   . Pancreatic cancer Maternal Uncle   . Stomach cancer Maternal Uncle   . Diabetes Maternal Uncle   . Colon cancer Maternal Uncle   . Esophageal cancer Maternal Uncle   . Anesthesia problems Neg Hx   . Hypotension Neg Hx   . Malignant hyperthermia Neg Hx   . Pseudochol deficiency Neg Hx     Social History   Tobacco Use  . Smoking status: Current Every Day Smoker    Packs/day: 1.00  . Smokeless tobacco: Never Used  Substance Use Topics  . Alcohol use: Yes    Comment: occasional  . Drug use: Never    Home Medications Prior to Admission medications   Medication Sig Start Date End Date Taking? Authorizing Provider  clonazePAM (KLONOPIN) 0.5 MG tablet Take 0.5 mg by mouth at bedtime.    [provider]  ondansetron (ZOFRAN-ODT) 4 MG disintegrating tablet Take 1 tablet (4 mg total) by mouth every 8 (eight) hours as needed for nausea or vomiting. 08/10/19   Vanessa Kick, MD  pantoprazole (PROTONIX) 40 MG tablet Take 1 tablet (40 mg total) by mouth daily. 03/14/19 03/13/20  Mansouraty, Telford Nab., MD  TRAZODONE HCL PO Take by mouth.    [provider]  modafinil (PROVIGIL) 100 MG tablet Take 50-100 mg by mouth See admin instructions. Take 1/2 to 1 tablet every morning and at noon 05/13/18 08/10/19  [provider]    Allergies    Ketorolac tromethamine, Amoxicillin, Penicillins, and Tramadol  Review of Systems   Review of  Systems  Constitutional: Positive for fatigue. Negative for chills and fever.  Respiratory: Negative for shortness of breath.   Cardiovascular: Negative for chest pain.  Gastrointestinal: Positive for nausea. Negative for abdominal pain, constipation, diarrhea and vomiting.  Genitourinary: Positive for dysuria, flank pain, frequency and hematuria. Negative for difficulty urinating, genital sores, vaginal bleeding and vaginal discharge.  Skin: Negative for rash and wound.  Allergic/Immunologic: Negative for immunocompromised state.  Neurological: Negative for weakness.  Hematological: Does not bruise/bleed easily.  Psychiatric/Behavioral: Negative for confusion.  All other systems reviewed and are negative.   Physical Exam Updated Vital Signs BP 105/81   Pulse 90   Temp 98.9 F (37.2 C) (Oral)   Resp 16   SpO2 94%   Physical Exam Vitals and nursing note reviewed. Exam conducted with a chaperone present.  Constitutional:  General: She is not in acute distress.    Appearance: She is well-developed. She is not diaphoretic.  HENT:     Head: Normocephalic and atraumatic.  Cardiovascular:     Rate and Rhythm: Normal rate and regular rhythm.     Pulses: Normal pulses.     Heart sounds: Normal heart sounds.  Pulmonary:     Effort: Pulmonary effort is normal.     Breath sounds: Normal breath sounds.  Abdominal:     Palpations: Abdomen is soft.     Tenderness: There is no abdominal tenderness.  Genitourinary:    Vagina: No vaginal discharge.     Cervix: No cervical motion tenderness or discharge.     Uterus: Normal. Not enlarged and not tender.      Adnexa: Right adnexa normal and left adnexa normal.     Comments: Skin appears orange, consistent with history of taking AZO. Musculoskeletal:       Back:     Right lower leg: No edema.     Left lower leg: No edema.  Skin:    General: Skin is warm and dry.     Findings: No erythema or rash.  Neurological:     Mental Status:  She is alert and oriented to person, place, and time.  Psychiatric:        Behavior: Behavior normal.     ED Results / Procedures / Treatments   Labs (all labs ordered are listed, but only abnormal results are displayed) Labs Reviewed  BASIC METABOLIC PANEL - Abnormal; Notable for the following components:      Result Value   Glucose, Bld 114 (*)    Calcium 8.7 (*)    All other components within normal limits  URINE CULTURE  WET PREP, GENITAL  CBC  URINALYSIS, ROUTINE W REFLEX MICROSCOPIC  POC URINE PREG, ED    EKG None  Radiology No results found.  Procedures Procedures (including critical care time)  Medications Ordered in ED Medications - No data to display  ED Course  I have reviewed the triage vital signs and the nursing notes.  Pertinent labs & imaging results that were available during my care of the patient were reviewed by me and considered in my medical decision making (see chart for details).  Clinical Course as of Oct 17 2201  Tue Oct 18, 6163  [id number] 39 year old female with complaint of dysuria, frequency, urgency, frank hematuria with back pain and vaginal irritation.  Symptoms started in December, patient went to urgent care and was diagnosed with pyelonephritis, treated with Bactrim.  Patient states her symptoms did not resolve, presented back to urgent care 1 month ago and was told she had a yeast infection and took Diflucan.  Patient symptoms persist, she continues to take AZO daily without relief of her symptoms.  Denies fevers or chills.  Does report nausea without vomiting.  On exam patient's abdomen is soft nontender, she does have diffuse low back tenderness.  Her pelvic exam is unremarkable with exception of her skin appearing orange consistent with taking AZO.  Urine sample at bedside appears to be dark red without clots.  Due to ongoing pain, will add a CT scan without contrast to her work-up today.  Consider appearance of her urine may be due to  taking the AZO.  Her CBC is unremarkable, specifically she is not anemic.  BMP is without significant changes, consistently negative for AKI.  Wet prep is pending.  Care signed out to oncoming provider awaiting  urinalysis, wet prep and CT.   [LM]    Clinical Course User Index [LM] Roque Lias   MDM Rules/Calculators/A&P                      Final Clinical Impression(s) / ED Diagnoses Final diagnoses:  None    Rx / DC Orders ED Discharge Orders    None       Roque Lias 10/18/19 2203    Lajean Saver, MD 10/18/19 [phone]

## 2019-10-19 MED ORDER — CIPROFLOXACIN HCL 500 MG PO TABS
500.0000 mg | ORAL_TABLET | Freq: Once | ORAL | Status: AC
  Administered 2019-10-19: 500 mg via ORAL
  Filled 2019-10-19: qty 1

## 2019-10-19 MED ORDER — CIPROFLOXACIN HCL 500 MG PO TABS: 500.0000 mg | ORAL_TABLET | Freq: Two times a day (BID) | ORAL | 0 refills | Status: DC

## 2019-10-19 MED ORDER — FLUCONAZOLE 150 MG PO TABS: 150.0000 mg | ORAL_TABLET | Freq: Once | ORAL | 0 refills | Status: AC

## 2019-10-19 NOTE — Discharge Instructions (Signed)
You need to follow-up with a urologist if you don't improve with the change in antibiotic therapy.  There is a risk of tendon rupture with taking Cipro, but due to your persistent symptoms and prior diagnosis of pyelonephritis AND your allergy to penicillins, I feel that it is your best option.  Take the diflucan after 7 days then after 14 days.

## 2019-10-19 NOTE — ED Provider Notes (Signed)
Patient with persistent hematuria and dysuria.  Some concern for pyelo.  No stones on CT.  Will treat with cipro.  DC bactrim since no improvement.  Can't take cephalosporins.  Recommend urology f/u.   Montine Circle, PA-C 10/19/19 0007    Lajean Saver, MD 10/19/19 [phone]

## 2019-10-21 LAB — URINE CULTURE: Culture: 30000 — AB

## 2019-10-23 ENCOUNTER — Telehealth: Payer: Self-pay | Admitting: Emergency Medicine

## 2019-10-23 NOTE — Telephone Encounter (Signed)
Post ED Visit - Positive Culture Follow-up  Culture report reviewed by antimicrobial stewardship pharmacist: [facility] Team []  Elenor Quinones, Pharm.D. []  Heide Guile, Pharm.D., BCPS AQ-ID []  Parks Neptune, Pharm.D., BCPS []  Alycia Rossetti, Pharm.D., BCPS []  [place], [place].D., BCPS, AAHIVP []  Legrand Como, Pharm.D., BCPS, AAHIVP []  Salome Arnt, PharmD, BCPS []  Johnnette Gourd, PharmD, BCPS []  Hughes Better, PharmD, BCPS [x]  Duanne Limerick, PharmD []  Laqueta Linden, PharmD, BCPS []  Albertina Parr, PharmD  [facility] Team []  Leodis Sias, PharmD []  Lindell Spar, PharmD []  Royetta Asal, PharmD []  Graylin Shiver, Rph []  Rema Fendt) Glennon Mac, PharmD []  Arlyn Dunning, PharmD []  Netta Cedars, PharmD []  Dia Sitter, PharmD []  Leone Haven, PharmD []  Gretta Arab, PharmD []  Theodis Shove, PharmD []  Peggyann Juba, PharmD []  Reuel Boom, PharmD   Positive urine culture Treated with Cipro, organism sensitive to the same and no further patient follow-up is required at this time.  Sandi Raveling Bensyn Bornemann 10/23/2019, 10:18 AM

## 2020-02-04 IMAGING — CT CT ABDOMEN AND PELVIS WITH CONTRAST
2 of 4 series · 16 of 46 positions shown, 18 images · IV contrast (ISOVUE 300)
Comparison: Outside hospital CT abdomen/pelvis dated 05/27/2018

CLINICAL DATA: Abdominal pain, abnormal CT

EXAM:
CT ABDOMEN AND PELVIS WITH CONTRAST
TECHNIQUE: Multidetector CT imaging of the abdomen and pelvis was performed
using the standard protocol following bolus administration of
intravenous contrast.
CONTRAST:  100mL OMNIPAQUE IOHEXOL 300 MG/ML  SOLN

[Series 2: abd/pel w · axial · 0.72mm/px · z∈[-437,-32]mm · 13 of 89 slices shown, 15 images]
[im 4/89  soft-tissue]
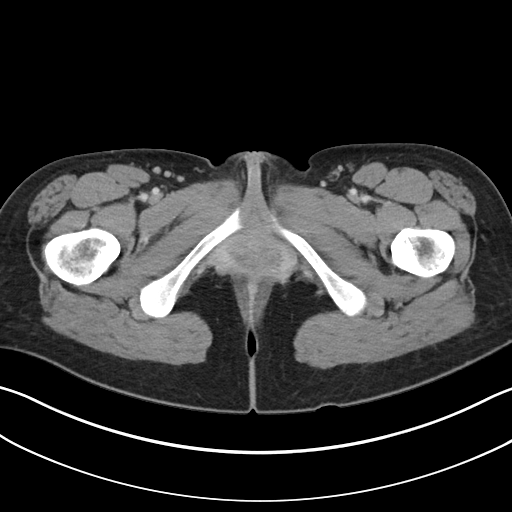
[im 4/89  bone]
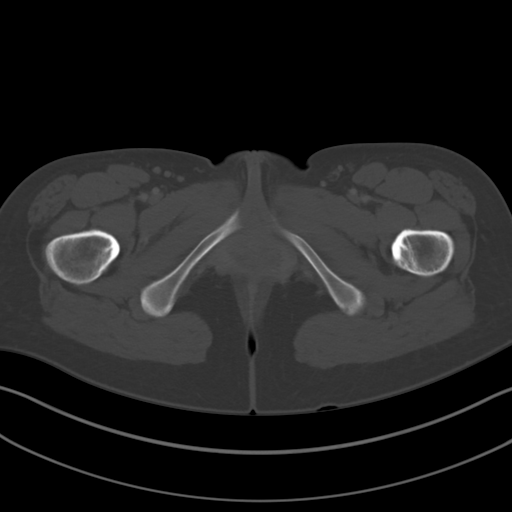
[im 11/89  soft-tissue]
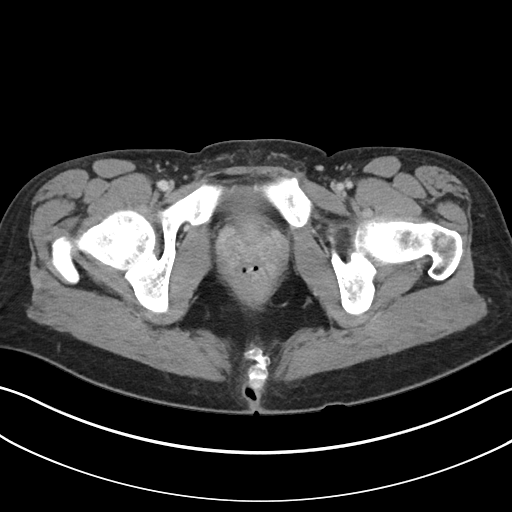
[im 18/89  soft-tissue]
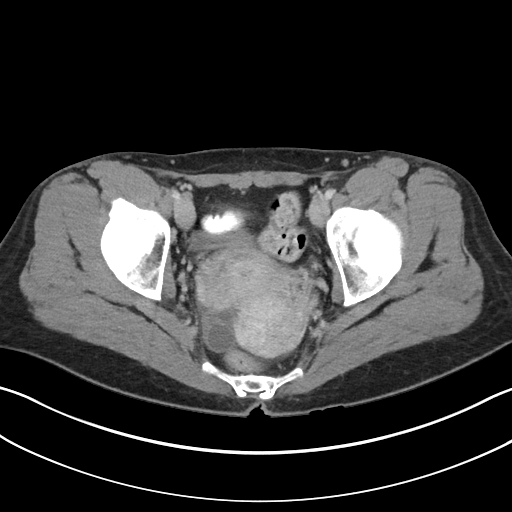
[im 25/89  soft-tissue]
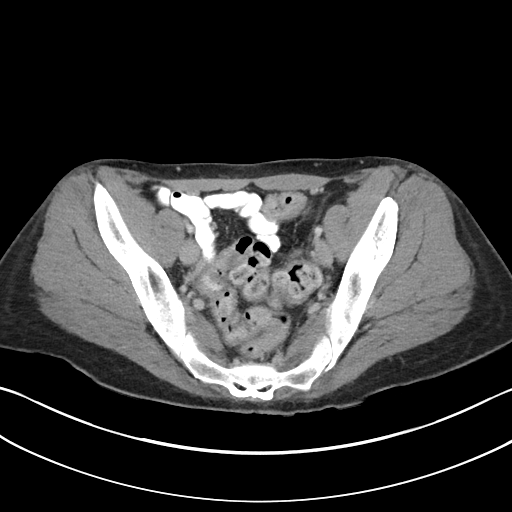
[im 32/89  soft-tissue]
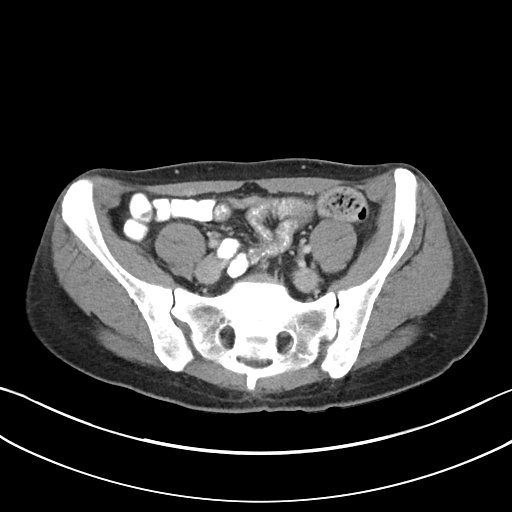
[im 39/89  soft-tissue]
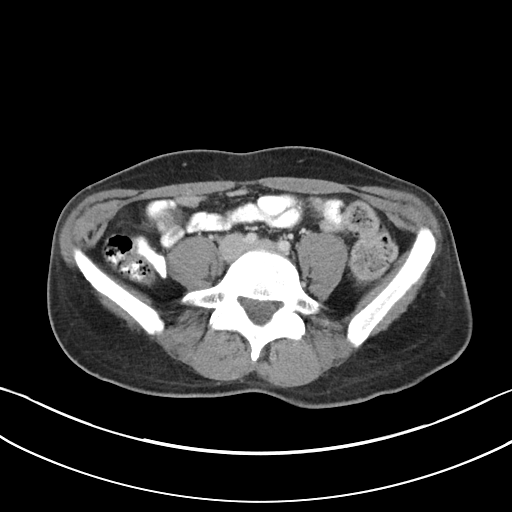
[im 46/89  soft-tissue]
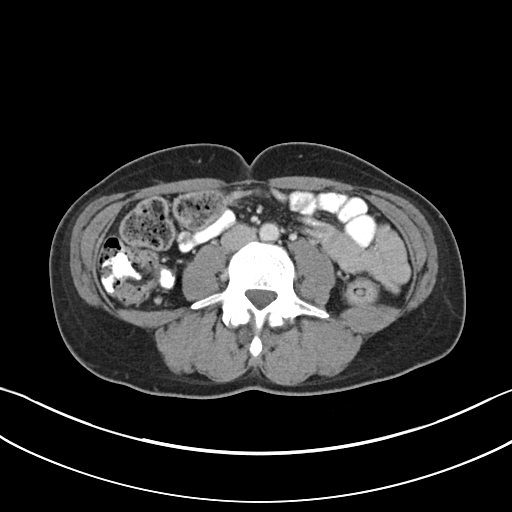
[im 50/89  soft-tissue]
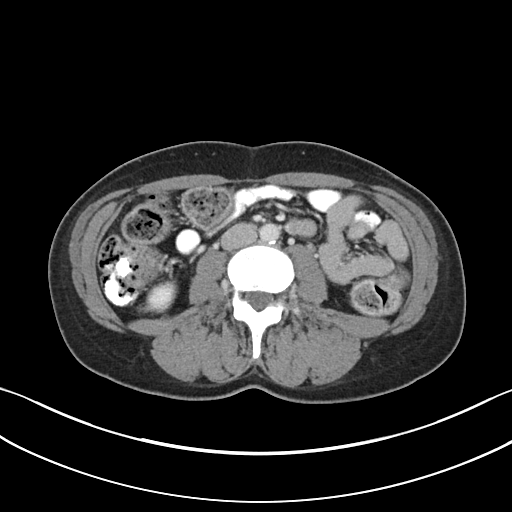
[im 57/89  soft-tissue]
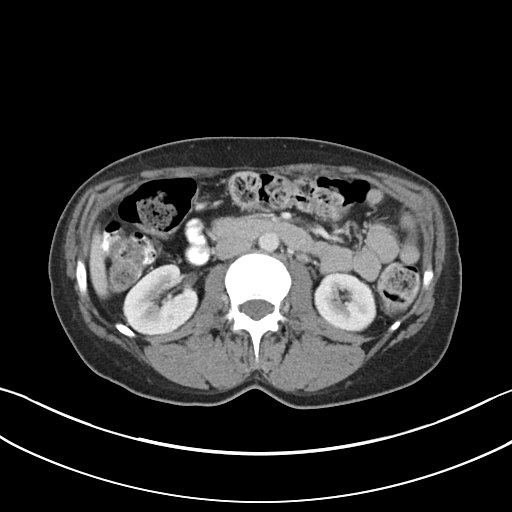
[im 57/89  bone]
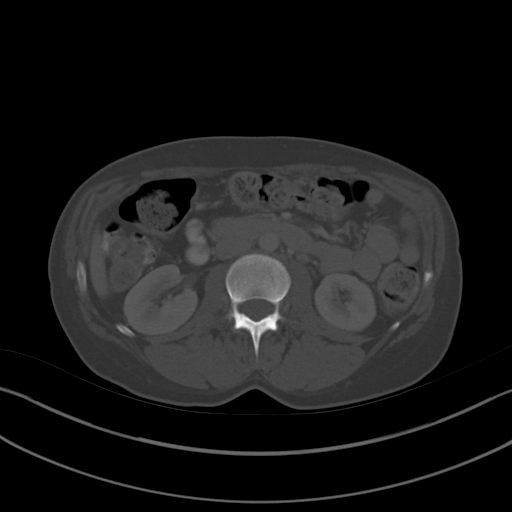
[im 64/89  soft-tissue]
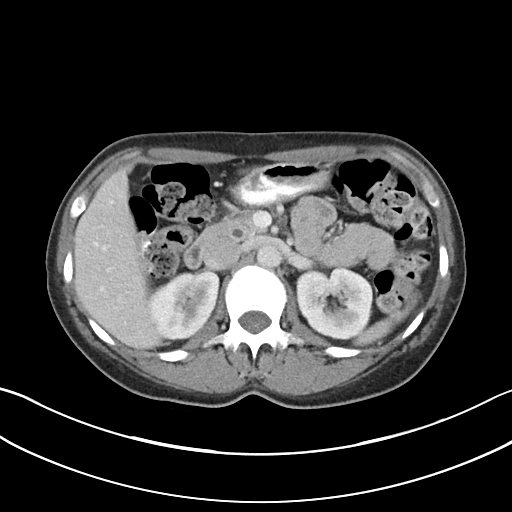
[im 71/89  soft-tissue]
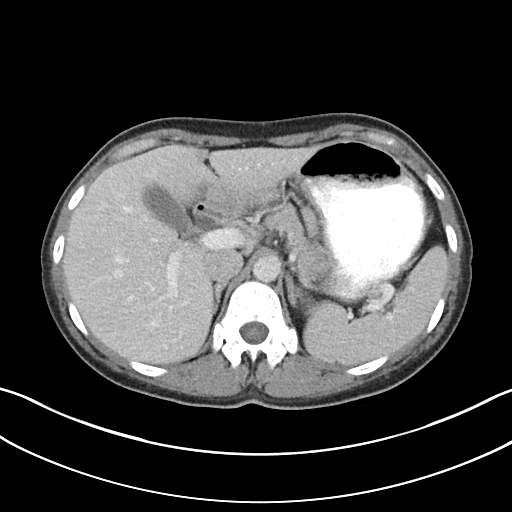
[im 78/89  soft-tissue]
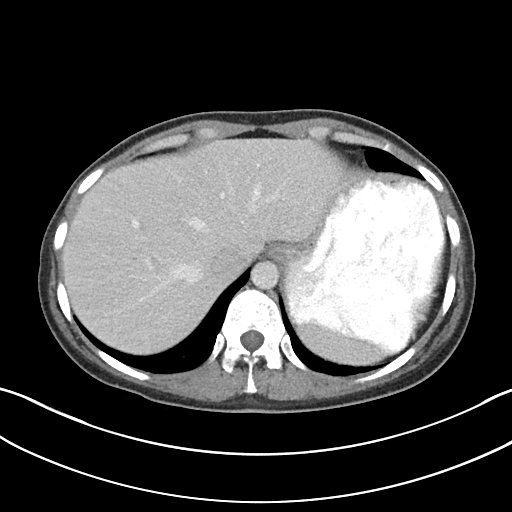
[im 85/89  soft-tissue]
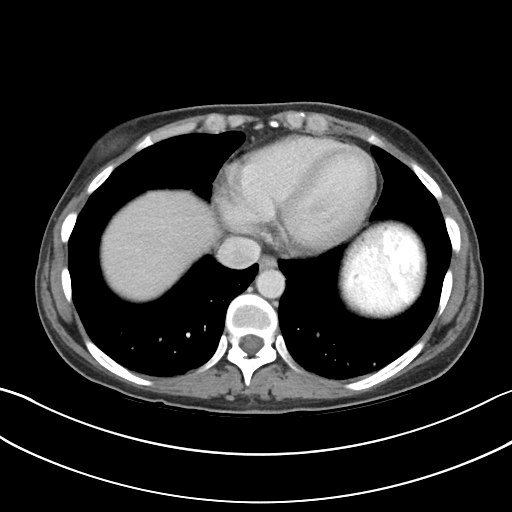

[Series 5: abd/pel w st · coronal · 0.67mm/px · 3 of 73 slices shown]
[im 25/73  soft-tissue]
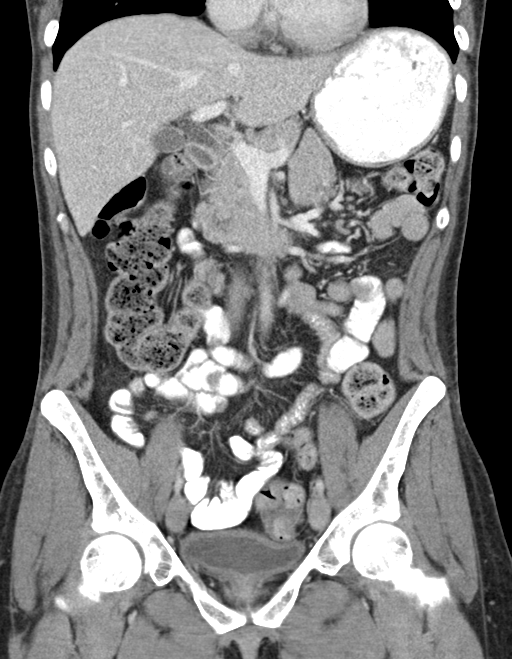
[im 33/73  soft-tissue]
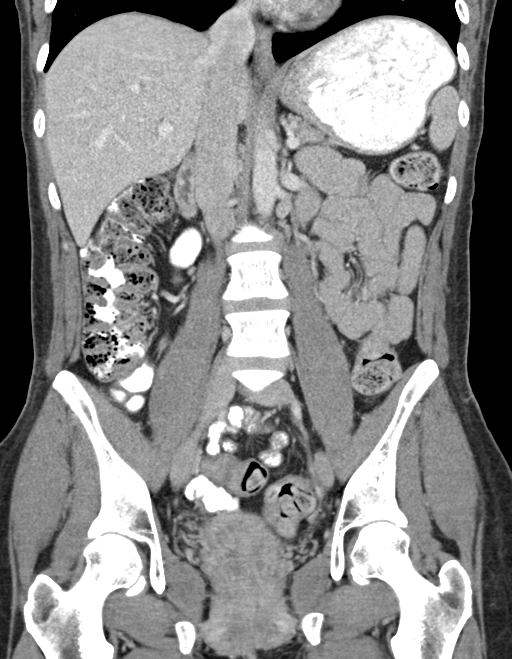
[im 41/73  soft-tissue]
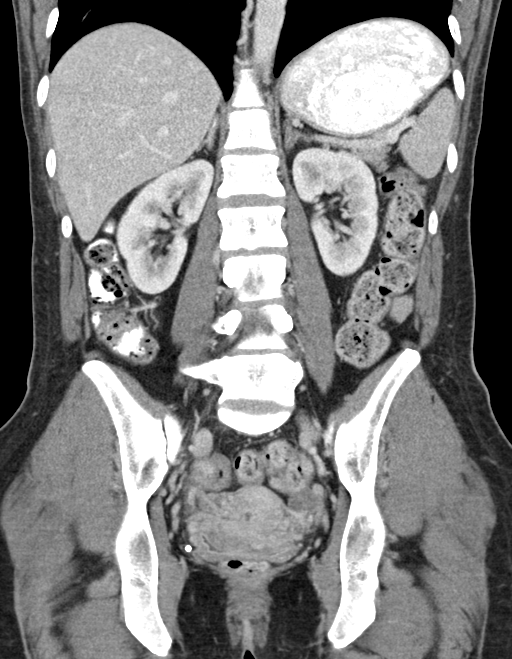

[16 of 46 positions shown; findings below may reference images not displayed]

FINDINGS: Lower chest: Lung bases are clear.  Bilateral breast augmentation.

Hepatobiliary: Liver is within normal limits.

Gallbladder is unremarkable. No intrahepatic or extrahepatic ductal
dilatation.

Pancreas: Within normal limits.

Spleen: Within normal limits.

Adrenals/Urinary Tract: Adrenal glands are within normal limits.

Kidneys are within normal limits.  No hydronephrosis.

Bladder is mildly thick-walled although underdistended.

Stomach/Bowel: Distended stomach.

No evidence of bowel obstruction.

Normal appendix (series 2/image 41).

No colonic wall thickening or inflammatory changes.

Vascular/Lymphatic: No evidence of abdominal aortic aneurysm.

Mild atherosclerotic calcifications of the abdominal aorta.

No suspicious abdominopelvic lymphadenopathy.

Reproductive: Retroverted uterus with heterogeneous enhancement of
the uterine fundus (series 2/image 70), poorly evaluated on CT.

Bilateral ovaries are within normal limits, noting small follicles.

Other: Trace pelvic ascites.

Vague soft tissue anterior to the pancreatic head (series 2/image
28; coronal image 23), improving, possibly reflecting sequela of
mesenteric inflammation.

Musculoskeletal: Visualized osseous structures are within normal
limits.
IMPRESSION: Improving vague soft tissue anterior to the pancreatic head,
possibly reflecting sequela of mesenteric inflammation. Given
improvement, this is not considered a concerning finding. If
clinically warranted, follow-up CT can be performed in 6 months.

Heterogeneous enhancement of the uterine fundus, poorly evaluated on
CT. Consider pelvic ultrasound for further evaluation.

## 2020-08-08 ENCOUNTER — Ambulatory Visit (HOSPITAL_COMMUNITY)
Admission: EM | Admit: 2020-08-08 | Discharge: 2020-08-08 | Disposition: A | Discharge status: Home / Self Care | Payer: Medicaid Other | Attending: Emergency Medicine | Admitting: Emergency Medicine

## 2020-08-08 ENCOUNTER — Encounter (HOSPITAL_COMMUNITY): Payer: Self-pay | Admitting: Emergency Medicine

## 2020-08-08 ENCOUNTER — Other Ambulatory Visit: Payer: Self-pay

## 2020-08-08 DIAGNOSIS — R1030 Lower abdominal pain, unspecified: Secondary | ICD-10-CM | POA: Diagnosis present

## 2020-08-08 DIAGNOSIS — M545 Low back pain, unspecified: Secondary | ICD-10-CM | POA: Insufficient documentation

## 2020-08-08 DIAGNOSIS — R3915 Urgency of urination: Secondary | ICD-10-CM | POA: Insufficient documentation

## 2020-08-08 DIAGNOSIS — N898 Other specified noninflammatory disorders of vagina: Secondary | ICD-10-CM | POA: Diagnosis present

## 2020-08-08 LAB — POCT URINALYSIS DIPSTICK, ED / UC
Bilirubin Urine: NEGATIVE
Glucose, UA: NEGATIVE mg/dL
Hgb urine dipstick: NEGATIVE
Nitrite: NEGATIVE
Protein, ur: NEGATIVE mg/dL
Specific Gravity, Urine: >=1.03 (ref 1.005–1.030)
Urobilinogen, UA: 0.2 mg/dL (ref 0.0–1.0)
pH: 5.5 (ref 5.0–8.0)

## 2020-08-08 LAB — CERVICOVAGINAL ANCILLARY ONLY
Bacterial Vaginitis (gardnerella): POSITIVE — AB
Candida Glabrata: NEGATIVE
Candida Vaginitis: NEGATIVE
Chlamydia: NEGATIVE
Comment: NEGATIVE
Comment: NEGATIVE
Comment: NEGATIVE
Comment: NEGATIVE
Comment: NEGATIVE
Comment: NORMAL
Neisseria Gonorrhea: NEGATIVE
Trichomonas: NEGATIVE

## 2020-08-08 LAB — POC URINE PREG, ED: Preg Test, Ur: NEGATIVE

## 2020-08-08 MED ORDER — METRONIDAZOLE 500 MG PO TABS: 500.0000 mg | ORAL_TABLET | Freq: Two times a day (BID) | ORAL | 0 refills | Status: AC

## 2020-08-08 NOTE — Discharge Instructions (Addendum)
Your urine does not indicate urinary tract infection but is consistent with concern for vaginal source of symptoms.  I have started flagyl to treat bacterial vaginosis, pending the results of your vaginal swab.  We will notify of you any positive findings or if any changes to treatment are needed. If normal or otherwise without concern to your results, we will not call you. Please log on to your MyChart to review your results if interested in so.   Hold off on having intercourse of until you have completed the course.  Tylenol, ibuprofen or aleve as needed for low back pain.  If symptoms worsen or do not improve in the next week to return to be seen or to follow up with your PCP.

## 2020-08-08 NOTE — ED Provider Notes (Signed)
[name]    CSN: [id number] Arrival date & time: 08/08/20  0803      History   Chief Complaint Chief Complaint  Patient presents with  . Dysuria    HPI Mary Hogan is a 39 y.o. female.   Mary Hogan presents with complaints of vaginal symptoms as well as low back, abdominal, and urinary symptoms. A few weeks ago noted urinary urgency, sometimes incontinence even because of this. 1 week of low abdominal pain and low back pain, often worse to the right low abdomen. Sometimes movement worsens low back pain such as getting out of bed. No gi symptoms. Vaginal discharge with odor, as well as has noted some spotting, for the past week. Has been using a tampon even due to the discharge. No urinary frequency or dysuria. Sexually active with 1 partner, doesn't use condoms. History of bv which has been similar. History of tubal ligation. No known std exposure.    ROS per HPI, negative if not otherwise mentioned.      Past Medical History:  Diagnosis Date  . Abnormal Pap smear   . Anxiety   . Depression   . Headache(784.0)   . IBS (irritable bowel syndrome)   . Scoliosis     Patient Active Problem List   Diagnosis Date Noted  . Nausea without vomiting 12/26/2018  . Abdominal pain 09/24/2018  . Abnormal CT of the abdomen 09/24/2018  . Other constipation 09/24/2018  . Change in bowel habits 09/24/2018  . Anxiety 07/07/2018  . Encounter for blood transfusion 07/07/2018  . Scoliosis 07/07/2018  . Psychotic disorder ([place]) 01/20/2018  . Methamphetamine abuse ([place]) 10/27/2017  . UTI due to extended-spectrum beta lactamase (ESBL) producing Escherichia coli 06/09/2016  . Bipolar affective disorder, currently manic, moderate ([place]) 06/05/2016  . URI (upper respiratory infection) 12/23/2013  . Attention deficit disorder 06/14/2013  . Generalized anxiety disorder 06/14/2013  . Essential (primary) hypertension 05/02/2013  . SOB (shortness of breath)  05/02/2013  . Chest pain 03/08/2012  . MVP (mitral valve prolapse) 03/08/2012  . Palpitations 03/08/2012  . Hx of migraine headaches 02/19/2012  . Polysubstance overdose 12/25/2011  . Bipolar disorder current episode depressed ([place]) 12/06/2011    Class: Acute  . IBS (irritable bowel syndrome) 10/13/2011  . Epigastric pain 08/27/2011  . Chronic pain 03/05/2011  . Migraine 03/05/2011  . TOBACCO USER 05/26/2009  . AMENORRHEA, SECONDARY 01/26/2007  . DEPRESSIVE DISORDER, NOS 11/05/2006  . RESTLESS LEGS SYNDROME 11/05/2006  . PAPANICOLAOU SMEAR, ABNORMAL 11/05/2006  . PAPANICOLAOU SMEAR, ABNORMAL 11/05/2006    Past Surgical History:  Procedure Laterality Date  . BIOPSY  03/14/2019   Procedure: BIOPSY;  Surgeon: Rush Landmark Telford Nab., MD;  Location: [place];  Service: Gastroenterology;;  . BREAST ENHANCEMENT SURGERY  2008  . DILATION AND CURETTAGE OF UTERUS    . ENDOMETRIAL ABLATION    . ESOPHAGOGASTRODUODENOSCOPY (EGD) WITH PROPOFOL N/A 03/14/2019   Procedure: ESOPHAGOGASTRODUODENOSCOPY (EGD) WITH PROPOFOL;  Surgeon: Rush Landmark Telford Nab., MD;  Location: [place];  Service: Gastroenterology;  Laterality: N/A;  . EUS N/A 03/14/2019   Procedure: UPPER ENDOSCOPIC ULTRASOUND (EUS) RADIAL;  Surgeon: Rush Landmark Telford Nab., MD;  Location: [facility];  Service: Gastroenterology;  Laterality: N/A;  . FINE NEEDLE ASPIRATION  03/14/2019   Procedure: FINE NEEDLE ASPIRATION (FNA) LINEAR;  Surgeon: Irving Copas., MD;  Location: [place];  Service: Gastroenterology;;  . LAPAROSCOPIC OVARIAN CYSTECTOMY  2012  . TUBAL LIGATION      OB History  Gravida  9   Para  4   Term  2   Preterm  2   AB  5   Living  4     SAB  2   TAB  2   Ectopic  1   Multiple      Live Births               Home Medications    Prior to Admission medications   Medication Sig Start Date End Date Taking? Authorizing Provider  ciprofloxacin (CIPRO) 500 MG tablet Take 1  tablet (500 mg total) by mouth 2 (two) times daily. 10/19/19   Montine Circle, PA-C  clonazePAM (KLONOPIN) 0.5 MG tablet Take 0.5 mg by mouth at bedtime.    [provider]  metroNIDAZOLE (FLAGYL) 500 MG tablet Take 1 tablet (500 mg total) by mouth 2 (two) times daily for 7 days. 08/08/20 08/15/20  Zigmund Gottron, NP  ondansetron (ZOFRAN-ODT) 4 MG disintegrating tablet Take 1 tablet (4 mg total) by mouth every 8 (eight) hours as needed for nausea or vomiting. 08/10/19   Vanessa Kick, MD  pantoprazole (PROTONIX) 40 MG tablet Take 1 tablet (40 mg total) by mouth daily. 03/14/19 03/13/20  Mansouraty, Telford Nab., MD  TRAZODONE HCL PO Take by mouth.    [provider]  modafinil (PROVIGIL) 100 MG tablet Take 50-100 mg by mouth See admin instructions. Take 1/2 to 1 tablet every morning and at noon 05/13/18 08/10/19  [provider]    Family History Family History  Problem Relation Age of Onset  . Alzheimer's disease Mother   . Hypertension Mother   . Diabetes Mother   . Anxiety disorder Mother   . Depression Mother   . Post-traumatic stress disorder Mother   . Irritable bowel syndrome Mother   . Parkinson's disease Mother   . Diabetes Father   . Liver disease Maternal Grandfather   . Pancreatic cancer Maternal Uncle   . Stomach cancer Maternal Uncle   . Diabetes Maternal Uncle   . Colon cancer Maternal Uncle   . Esophageal cancer Maternal Uncle   . Anesthesia problems Neg Hx   . Hypotension Neg Hx   . Malignant hyperthermia Neg Hx   . Pseudochol deficiency Neg Hx     Social History Social History   Tobacco Use  . Smoking status: Current Every Day Smoker    Packs/day: 1.00  . Smokeless tobacco: Never Used  Vaping Use  . Vaping Use: Never used  Substance Use Topics  . Alcohol use: Yes    Comment: occasional  . Drug use: Never     Allergies   Ketorolac tromethamine, Amoxicillin, Penicillins, and Tramadol   Review of Systems Review of  Systems   Physical Exam Triage Vital Signs ED Triage Vitals  Enc Vitals Group     BP 08/08/20 0822 115/77     Pulse Rate 08/08/20 0822 (!) 113     Resp 08/08/20 0822 18     Temp 08/08/20 0822 98.9 F (37.2 C)     Temp Source 08/08/20 0822 Oral     SpO2 08/08/20 0822 96 %     Weight --      Height --      Head Circumference --      Peak Flow --      Pain Score 08/08/20 0821 6     Pain Loc --      Pain Edu? --      Excl. in  GC? --    No data found.  Updated Vital Signs BP 115/77 (BP Location: Right Arm)   Pulse (!) 113   Temp 98.9 F (37.2 C) (Oral)   Resp 18   SpO2 96%    Physical Exam Constitutional:      General: She is not in acute distress.    Appearance: She is well-developed.  Cardiovascular:     Rate and Rhythm: Tachycardia present.  Pulmonary:     Effort: Pulmonary effort is normal.  Abdominal:     Palpations: Abdomen is not rigid.     Tenderness: There is no abdominal tenderness. There is no right CVA tenderness, left CVA tenderness, guarding or rebound.     Comments: Mild generalized low abdominal pain without specific point tenderness  Genitourinary:    Comments: Denies sores, lesions, vaginal bleeding;  gu exam deferred at this time, vaginal self swab collected.   Musculoskeletal:     Lumbar back: No bony tenderness. Negative right straight leg raise test and negative left straight leg raise test.     Comments: Mild low back pain with flexion to bilateral low back without midline or bony tenderness  Skin:    General: Skin is warm and dry.  Neurological:     Mental Status: She is alert and oriented to person, place, and time.      UC Treatments / Results  Labs (all labs ordered are listed, but only abnormal results are displayed) Labs Reviewed  POCT URINALYSIS DIPSTICK, ED / UC - Abnormal; Notable for the following components:      Result Value   Ketones, ur TRACE (*)    Leukocytes,Ua TRACE (*)    All other components within normal limits   URINE CULTURE  POC URINE PREG, ED  CERVICOVAGINAL ANCILLARY ONLY    EKG   Radiology No results found.  Procedures Procedures (including critical care time)  Medications Ordered in UC Medications - No data to display  Initial Impression / Assessment and Plan / UC Course  I have reviewed the triage vital signs and the nursing notes.  Pertinent labs & imaging results that were available during my care of the patient were reviewed by me and considered in my medical decision making (see chart for details).     ua without indications of urinary source of symptoms. Vaginal cytology pending with flagyl initiated. Suspect musculoskeletal source of low back pain based on exam. Return precautions provided. Patient verbalized understanding and agreeable to plan.  Ambulatory out of clinic without difficulty.   Final Clinical Impressions(s) / UC Diagnoses   Final diagnoses:  Vaginal odor  Vaginal discharge  Lower abdominal pain  Bilateral low back pain without sciatica, unspecified chronicity  Urinary urgency     Discharge Instructions     Your urine does not indicate urinary tract infection but is consistent with concern for vaginal source of symptoms.  I have started flagyl to treat bacterial vaginosis, pending the results of your vaginal swab.  We will notify of you any positive findings or if any changes to treatment are needed. If normal or otherwise without concern to your results, we will not call you. Please log on to your MyChart to review your results if interested in so.   Hold off on having intercourse of until you have completed the course.  Tylenol, ibuprofen or aleve as needed for low back pain.  If symptoms worsen or do not improve in the next week to return to be seen or to  follow up with your PCP.        ED Prescriptions    Medication Sig Dispense Auth. Provider   metroNIDAZOLE (FLAGYL) 500 MG tablet Take 1 tablet (500 mg total) by mouth 2 (two) times daily  for 7 days. 14 tablet Zigmund Gottron, NP     PDMP not reviewed this encounter.   Zigmund Gottron, NP 08/08/20 [phone]

## 2020-08-08 NOTE — ED Triage Notes (Signed)
Pt presents with lower abdominal pain, dysuria, and low back pain xs 1-2 weeks. States is also having abnormal discharge with an odor.

## 2020-08-09 LAB — URINE CULTURE: Culture: NO GROWTH

## 2020-08-12 ENCOUNTER — Ambulatory Visit (HOSPITAL_COMMUNITY)
Admission: EM | Admit: 2020-08-12 | Discharge: 2020-08-12 | Disposition: A | Discharge status: Home / Self Care | Payer: Medicaid Other

## 2020-08-12 ENCOUNTER — Encounter (HOSPITAL_COMMUNITY): Payer: Self-pay | Admitting: *Deleted

## 2020-08-12 ENCOUNTER — Other Ambulatory Visit: Payer: Self-pay

## 2020-08-12 DIAGNOSIS — R1084 Generalized abdominal pain: Secondary | ICD-10-CM

## 2020-08-12 DIAGNOSIS — R5381 Other malaise: Secondary | ICD-10-CM

## 2020-08-12 NOTE — ED Triage Notes (Addendum)
PT reports feeling worse since she started taking Metronidazole . Pt last seen and treated 08-08-20. BP low and Pt reports feeling dizzy and feels like she is in a tunnel . Pt reports she is having a hard time hearing.

## 2020-08-12 NOTE — Discharge Instructions (Addendum)
Go to the emergency department for evaluation of your acute abdominal pain. 

## 2020-08-12 NOTE — ED Provider Notes (Signed)
[place]    CSN: [id number] Arrival date & time: 08/12/20  1130      History   Chief Complaint Chief Complaint  Patient presents with  . Abdominal Pain  . Nausea    HPI Mary Hogan is a 39 y.o. female.  Patient presents with acute abdominal pain which she currently rates as 9/10.  She also reports generalized malaise.  She attributes her symptoms to taking Flagyl but she stopped this medication 2 days ago.  She was seen here on 08/08/2020; diagnosed with vaginal odor, vaginal discharge, lower abdominal pain, back pain, urinary urgency; treated with vaginal; urine culture negative, cytology positive for BV.  She denies fever, chills, focal weakness, chest pain, shortness of breath, vomiting, diarrhea, constipation, or other symptoms.  Her medical history includes hypertension, IBS, chronic pain, tobacco abuse, depression, restless leg syndrome, bipolar disorder, anxiety, ADD, methamphetamine abuse, migraines, polysubstance overdose, psychotic disorder.  The history is provided by the patient and medical records.    Past Medical History:  Diagnosis Date  . Abnormal Pap smear   . Anxiety   . Depression   . Headache(784.0)   . IBS (irritable bowel syndrome)   . Scoliosis     Patient Active Problem List   Diagnosis Date Noted  . Nausea without vomiting 12/26/2018  . Abdominal pain 09/24/2018  . Abnormal CT of the abdomen 09/24/2018  . Other constipation 09/24/2018  . Change in bowel habits 09/24/2018  . Anxiety 07/07/2018  . Encounter for blood transfusion 07/07/2018  . Scoliosis 07/07/2018  . Psychotic disorder ([place]) 01/20/2018  . Methamphetamine abuse ([place]) 10/27/2017  . UTI due to extended-spectrum beta lactamase (ESBL) producing Escherichia coli 06/09/2016  . Bipolar affective disorder, currently manic, moderate ([place]) 06/05/2016  . URI (upper respiratory infection) 12/23/2013  . Attention deficit disorder 06/14/2013  . Generalized anxiety disorder  06/14/2013  . Essential (primary) hypertension 05/02/2013  . SOB (shortness of breath) 05/02/2013  . Chest pain 03/08/2012  . MVP (mitral valve prolapse) 03/08/2012  . Palpitations 03/08/2012  . Hx of migraine headaches 02/19/2012  . Polysubstance overdose 12/25/2011  . Bipolar disorder current episode depressed ([place]) 12/06/2011    Class: Acute  . IBS (irritable bowel syndrome) 10/13/2011  . Epigastric pain 08/27/2011  . Chronic pain 03/05/2011  . Migraine 03/05/2011  . TOBACCO USER 05/26/2009  . AMENORRHEA, SECONDARY 01/26/2007  . DEPRESSIVE DISORDER, NOS 11/05/2006  . RESTLESS LEGS SYNDROME 11/05/2006  . PAPANICOLAOU SMEAR, ABNORMAL 11/05/2006  . PAPANICOLAOU SMEAR, ABNORMAL 11/05/2006    Past Surgical History:  Procedure Laterality Date  . BIOPSY  03/14/2019   Procedure: BIOPSY;  Surgeon: Rush Landmark Telford Nab., MD;  Location: [facility];  Service: Gastroenterology;;  . BREAST ENHANCEMENT SURGERY  2008  . DILATION AND CURETTAGE OF UTERUS    . ENDOMETRIAL ABLATION    . ESOPHAGOGASTRODUODENOSCOPY (EGD) WITH PROPOFOL N/A 03/14/2019   Procedure: ESOPHAGOGASTRODUODENOSCOPY (EGD) WITH PROPOFOL;  Surgeon: Rush Landmark Telford Nab., MD;  Location: [facility];  Service: Gastroenterology;  Laterality: N/A;  . EUS N/A 03/14/2019   Procedure: UPPER ENDOSCOPIC ULTRASOUND (EUS) RADIAL;  Surgeon: Rush Landmark Telford Nab., MD;  Location: [place];  Service: Gastroenterology;  Laterality: N/A;  . FINE NEEDLE ASPIRATION  03/14/2019   Procedure: FINE NEEDLE ASPIRATION (FNA) LINEAR;  Surgeon: Irving Copas., MD;  Location: [place];  Service: Gastroenterology;;  . LAPAROSCOPIC OVARIAN CYSTECTOMY  2012  . TUBAL LIGATION      OB History    Gravida  9   Para  4   Term  2   Preterm  2   AB  5   Living  4     SAB  2   TAB  2   Ectopic  1   Multiple      Live Births               Home Medications    Prior to Admission medications   Medication Sig Start Date  End Date Taking? Authorizing Provider  clonazePAM (KLONOPIN) 0.5 MG tablet Take 0.5 mg by mouth at bedtime.   Yes [provider]  TRAZODONE HCL PO Take by mouth.   Yes [provider]  ciprofloxacin (CIPRO) 500 MG tablet Take 1 tablet (500 mg total) by mouth 2 (two) times daily. 10/19/19   Montine Circle, PA-C  metroNIDAZOLE (FLAGYL) 500 MG tablet Take 1 tablet (500 mg total) by mouth 2 (two) times daily for 7 days. 08/08/20 08/15/20  Zigmund Gottron, NP  ondansetron (ZOFRAN-ODT) 4 MG disintegrating tablet Take 1 tablet (4 mg total) by mouth every 8 (eight) hours as needed for nausea or vomiting. 08/10/19   Vanessa Kick, MD  pantoprazole (PROTONIX) 40 MG tablet Take 1 tablet (40 mg total) by mouth daily. 03/14/19 03/13/20  Mansouraty, Telford Nab., MD  modafinil (PROVIGIL) 100 MG tablet Take 50-100 mg by mouth See admin instructions. Take 1/2 to 1 tablet every morning and at noon 05/13/18 08/10/19  [provider]    Family History Family History  Problem Relation Age of Onset  . Alzheimer's disease Mother   . Hypertension Mother   . Diabetes Mother   . Anxiety disorder Mother   . Depression Mother   . Post-traumatic stress disorder Mother   . Irritable bowel syndrome Mother   . Parkinson's disease Mother   . Diabetes Father   . Liver disease Maternal Grandfather   . Pancreatic cancer Maternal Uncle   . Stomach cancer Maternal Uncle   . Diabetes Maternal Uncle   . Colon cancer Maternal Uncle   . Esophageal cancer Maternal Uncle   . Anesthesia problems Neg Hx   . Hypotension Neg Hx   . Malignant hyperthermia Neg Hx   . Pseudochol deficiency Neg Hx     Social History Social History   Tobacco Use  . Smoking status: Current Every Day Smoker    Packs/day: 1.00  . Smokeless tobacco: Never Used  Vaping Use  . Vaping Use: Never used  Substance Use Topics  . Alcohol use: Yes    Comment: occasional  . Drug use: Never     Allergies   Ketorolac tromethamine,  Amoxicillin, Penicillins, and Tramadol   Review of Systems Review of Systems  Constitutional: Positive for fatigue. Negative for chills and fever.  HENT: Negative for ear pain and sore throat.   Eyes: Negative for pain and visual disturbance.  Respiratory: Negative for cough and shortness of breath.   Cardiovascular: Negative for chest pain and palpitations.  Gastrointestinal: Positive for abdominal pain. Negative for diarrhea, nausea and vomiting.  Genitourinary: Positive for vaginal discharge. Negative for dysuria, flank pain and hematuria.  Musculoskeletal: Negative for arthralgias and back pain.  Skin: Negative for color change and rash.  Neurological: Negative for seizures and syncope.  All other systems reviewed and are negative.    Physical Exam Triage Vital Signs ED Triage Vitals  Enc Vitals Group     BP 08/12/20 1242 96/62     Pulse Rate 08/12/20 1242 96  Resp 08/12/20 1242 (!) 8     Temp 08/12/20 1242 99.4 F (37.4 C)     Temp Source 08/12/20 1242 Oral     SpO2 08/12/20 1242 97 %     Weight 08/12/20 1245 165 lb (74.8 kg)     Height 08/12/20 1245 5\' 6"  (1.676 m)     Head Circumference --      Peak Flow --      Pain Score 08/12/20 1244 9     Pain Loc --      Pain Edu? --      Excl. in [place]? --    No data found.  Updated Vital Signs BP 96/62 (BP Location: Left Arm)   Pulse 96   Temp 99.4 F (37.4 C) (Oral)   Resp 18   Ht 5\' 6"  (1.676 m)   Wt 165 lb (74.8 kg)   SpO2 97%   BMI 26.63 kg/m   Visual Acuity Right Eye Distance:   Left Eye Distance:   Bilateral Distance:    Right Eye Near:   Left Eye Near:    Bilateral Near:     Physical Exam Vitals and nursing note reviewed.  Constitutional:      General: She is not in acute distress.    Appearance: She is well-developed. She is ill-appearing.  HENT:     Head: Normocephalic and atraumatic.     Mouth/Throat:     Mouth: Mucous membranes are moist.  Eyes:     Conjunctiva/sclera: Conjunctivae  normal.  Cardiovascular:     Rate and Rhythm: Normal rate and regular rhythm.     Heart sounds: No murmur heard.   Pulmonary:     Effort: Pulmonary effort is normal. No respiratory distress.     Breath sounds: Normal breath sounds.  Abdominal:     General: Bowel sounds are normal.     Palpations: Abdomen is soft.     Tenderness: There is generalized abdominal tenderness. There is no right CVA tenderness, left CVA tenderness, guarding or rebound.  Musculoskeletal:     Cervical back: Neck supple.  Skin:    General: Skin is warm and dry.     Findings: No rash.  Neurological:     General: No focal deficit present.     Mental Status: She is alert and oriented to person, place, and time.     Gait: Gait normal.  Psychiatric:        Mood and Affect: Mood normal.        Behavior: Behavior normal.      UC Treatments / Results  Labs (all labs ordered are listed, but only abnormal results are displayed) Labs Reviewed - No data to display  EKG   Radiology No results found.  Procedures Procedures (including critical care time)  Medications Ordered in UC Medications - No data to display  Initial Impression / Assessment and Plan / UC Course  I have reviewed the triage vital signs and the nursing notes.  Pertinent labs & imaging results that were available during my care of the patient were reviewed by me and considered in my medical decision making (see chart for details).   Acute abdominal, malaise.  Patient is ill-appearing and her abdomen is acutely tender to palpation.  Sending her to the ED for evaluation.  Patient agrees to plan of care.   Final Clinical Impressions(s) / UC Diagnoses   Final diagnoses:  Generalized abdominal pain     Discharge Instructions  Go to the emergency department for evaluation of your acute abdominal pain.        ED Prescriptions    None     PDMP not reviewed this encounter.   Sharion Balloon, NP 08/12/20 1324

## 2020-08-13 ENCOUNTER — Other Ambulatory Visit: Payer: Self-pay

## 2020-08-13 ENCOUNTER — Emergency Department (HOSPITAL_COMMUNITY)
Admission: EM | Admit: 2020-08-13 | Discharge: 2020-08-13 | Disposition: A | Discharge status: Home / Self Care | Payer: Medicaid Other | Attending: Emergency Medicine | Admitting: Emergency Medicine

## 2020-08-13 ENCOUNTER — Encounter (HOSPITAL_COMMUNITY): Payer: Self-pay | Admitting: Emergency Medicine

## 2020-08-13 DIAGNOSIS — Z5321 Procedure and treatment not carried out due to patient leaving prior to being seen by health care provider: Secondary | ICD-10-CM | POA: Diagnosis not present

## 2020-08-13 DIAGNOSIS — R1110 Vomiting, unspecified: Secondary | ICD-10-CM | POA: Diagnosis present

## 2020-08-13 LAB — COMPREHENSIVE METABOLIC PANEL
ALT: 9 U/L (ref 0–44)
AST: 15 U/L (ref 15–41)
Albumin: 3.6 g/dL (ref 3.5–5.0)
Alkaline Phosphatase: 50 U/L (ref 38–126)
Anion gap: 9 (ref 5–15)
BUN: 8 mg/dL (ref 6–20)
CO2: 28 mmol/L (ref 22–32)
Calcium: 9.1 mg/dL (ref 8.9–10.3)
Chloride: 100 mmol/L (ref 98–111)
Creatinine, Ser: 0.97 mg/dL (ref 0.44–1.00)
GFR, Estimated: >60 mL/min (ref >60)
Glucose, Bld: 95 mg/dL (ref 70–99)
Potassium: 4.2 mmol/L (ref 3.5–5.1)
Sodium: 137 mmol/L (ref 135–145)
Total Bilirubin: 0.3 mg/dL (ref 0.3–1.2)
Total Protein: 6.5 g/dL (ref 6.5–8.1)

## 2020-08-13 LAB — CBC
HCT: 38.6 % (ref 36.0–46.0)
Hemoglobin: 12.3 g/dL (ref 12.0–15.0)
MCH: 30.3 pg (ref 26.0–34.0)
MCHC: 31.9 g/dL (ref 30.0–36.0)
MCV: 95.1 fL (ref 80.0–100.0)
Platelets: 223 10*3/uL (ref 150–400)
RBC: 4.06 MIL/uL (ref 3.87–5.11)
RDW: 12.4 % (ref 11.5–15.5)
WBC: 7.5 10*3/uL (ref 4.0–10.5)
nRBC: 0 % (ref 0.0–0.2)

## 2020-08-13 LAB — LIPASE, BLOOD: Lipase: 27 U/L (ref 11–51)

## 2020-08-13 LAB — I-STAT BETA HCG BLOOD, ED (MC, WL, AP ONLY): I-stat hCG, quantitative: <5 m[IU]/mL (ref <5)

## 2020-08-13 NOTE — ED Triage Notes (Signed)
Pt c/o emesis for a week was seen on UC for same and states she is not feeling any better.

## 2020-09-12 ENCOUNTER — Other Ambulatory Visit: Payer: Medicaid Other

## 2020-09-12 ENCOUNTER — Ambulatory Visit (HOSPITAL_COMMUNITY)
Admission: EM | Admit: 2020-09-12 | Discharge: 2020-09-12 | Disposition: A | Discharge status: Home / Self Care | Payer: Medicaid Other | Attending: Emergency Medicine | Admitting: Emergency Medicine

## 2020-09-12 ENCOUNTER — Encounter (HOSPITAL_COMMUNITY): Payer: Self-pay

## 2020-09-12 DIAGNOSIS — Z20822 Contact with and (suspected) exposure to covid-19: Secondary | ICD-10-CM | POA: Diagnosis present

## 2020-09-12 DIAGNOSIS — R112 Nausea with vomiting, unspecified: Secondary | ICD-10-CM | POA: Insufficient documentation

## 2020-09-12 DIAGNOSIS — R519 Headache, unspecified: Secondary | ICD-10-CM | POA: Diagnosis present

## 2020-09-12 DIAGNOSIS — U071 COVID-19: Secondary | ICD-10-CM | POA: Insufficient documentation

## 2020-09-12 LAB — SARS CORONAVIRUS 2 (TAT 6-24 HRS): SARS Coronavirus 2: POSITIVE — AB

## 2020-09-12 MED ORDER — ACETAMINOPHEN 325 MG PO TABS
975.0000 mg | ORAL_TABLET | Freq: Once | ORAL | Status: AC
  Administered 2020-09-12: 975 mg via ORAL

## 2020-09-12 MED ORDER — DIPHENHYDRAMINE HCL 25 MG PO CAPS
25.0000 mg | ORAL_CAPSULE | Freq: Once | ORAL | Status: AC
  Administered 2020-09-12: 25 mg via ORAL

## 2020-09-12 MED ORDER — METOCLOPRAMIDE HCL 5 MG/ML IJ SOLN
5.0000 mg | Freq: Once | INTRAMUSCULAR | Status: AC
  Administered 2020-09-12: 5 mg via INTRAMUSCULAR

## 2020-09-12 MED ORDER — ONDANSETRON 4 MG PO TBDP: 4.0000 mg | ORAL_TABLET | Freq: Three times a day (TID) | ORAL | 0 refills | Status: DC | PRN

## 2020-09-12 MED ORDER — METOCLOPRAMIDE HCL 5 MG/ML IJ SOLN
INTRAMUSCULAR | Status: AC
  Filled 2020-09-12: qty 2

## 2020-09-12 MED ORDER — DIPHENHYDRAMINE HCL 25 MG PO CAPS
ORAL_CAPSULE | ORAL | Status: AC
  Filled 2020-09-12: qty 1

## 2020-09-12 MED ORDER — ACETAMINOPHEN 325 MG PO TABS
ORAL_TABLET | ORAL | Status: AC
  Filled 2020-09-12: qty 3

## 2020-09-12 NOTE — ED Provider Notes (Signed)
[facility]    CSN: [id number] Arrival date & time: 09/12/20  1136      History   Chief Complaint Chief Complaint  Patient presents with  . Emesis  . Headache  . Generalized Body Aches    HPI Mary Hogan is a 40 y.o. female.   Mary Hogan presents with complaints of Cough, sore throat, body aches, headache, nausea and vomiting, which started 4 days ago. States she was exposed to covid-19 a few days ago. Ibuprofen hasn't helped headache.    ROS per HPI, negative if not otherwise mentioned.      Past Medical History:  Diagnosis Date  . Abnormal Pap smear   . Anxiety   . Depression   . Headache(784.0)   . IBS (irritable bowel syndrome)   . Scoliosis     Patient Active Problem List   Diagnosis Date Noted  . Nausea without vomiting 12/26/2018  . Abdominal pain 09/24/2018  . Abnormal CT of the abdomen 09/24/2018  . Other constipation 09/24/2018  . Change in bowel habits 09/24/2018  . Anxiety 07/07/2018  . Encounter for blood transfusion 07/07/2018  . Scoliosis 07/07/2018  . Psychotic disorder (HCC) 01/20/2018  . Methamphetamine abuse (HCC) 10/27/2017  . UTI due to extended-spectrum beta lactamase (ESBL) producing Escherichia coli 06/09/2016  . Bipolar affective disorder, currently manic, moderate (HCC) 06/05/2016  . URI (upper respiratory infection) 12/23/2013  . Attention deficit disorder 06/14/2013  . Generalized anxiety disorder 06/14/2013  . Essential (primary) hypertension 05/02/2013  . SOB (shortness of breath) 05/02/2013  . Chest pain 03/08/2012  . MVP (mitral valve prolapse) 03/08/2012  . Palpitations 03/08/2012  . Hx of migraine headaches 02/19/2012  . Polysubstance overdose 12/25/2011  . Bipolar disorder current episode depressed (HCC) 12/06/2011    Class: Acute  . IBS (irritable bowel syndrome) 10/13/2011  . Epigastric pain 08/27/2011  . Chronic pain 03/05/2011  . Migraine 03/05/2011  . TOBACCO USER 05/26/2009  .  AMENORRHEA, SECONDARY 01/26/2007  . DEPRESSIVE DISORDER, NOS 11/05/2006  . RESTLESS LEGS SYNDROME 11/05/2006  . PAPANICOLAOU SMEAR, ABNORMAL 11/05/2006  . PAPANICOLAOU SMEAR, ABNORMAL 11/05/2006    Past Surgical History:  Procedure Laterality Date  . BIOPSY  03/14/2019   Procedure: BIOPSY;  Surgeon: Meridee Score Netty Starring., MD;  Location: [facility] ENDOSCOPY;  Service: Gastroenterology;;  . BREAST ENHANCEMENT SURGERY  2008  . DILATION AND CURETTAGE OF UTERUS    . ENDOMETRIAL ABLATION    . ESOPHAGOGASTRODUODENOSCOPY (EGD) WITH PROPOFOL N/A 03/14/2019   Procedure: ESOPHAGOGASTRODUODENOSCOPY (EGD) WITH PROPOFOL;  Surgeon: Meridee Score Netty Starring., MD;  Location: [facility] ENDOSCOPY;  Service: Gastroenterology;  Laterality: N/A;  . EUS N/A 03/14/2019   Procedure: UPPER ENDOSCOPIC ULTRASOUND (EUS) RADIAL;  Surgeon: Meridee Score Netty Starring., MD;  Location: [facility] ENDOSCOPY;  Service: Gastroenterology;  Laterality: N/A;  . FINE NEEDLE ASPIRATION  03/14/2019   Procedure: FINE NEEDLE ASPIRATION (FNA) LINEAR;  Surgeon: Lemar Lofty., MD;  Location: [facility] ENDOSCOPY;  Service: Gastroenterology;;  . LAPAROSCOPIC OVARIAN CYSTECTOMY  2012  . TUBAL LIGATION      OB History    Gravida  9   Para  4   Term  2   Preterm  2   AB  5   Living  4     SAB  2   IAB  2   Ectopic  1   Multiple      Live Births               Home Medications  Prior to Admission medications   Medication Sig Start Date End Date Taking? Authorizing Provider  ondansetron (ZOFRAN-ODT) 4 MG disintegrating tablet Take 1 tablet (4 mg total) by mouth every 8 (eight) hours as needed for nausea or vomiting. 09/12/20  Yes Linus Mako B, NP  ciprofloxacin (CIPRO) 500 MG tablet Take 1 tablet (500 mg total) by mouth 2 (two) times daily. 10/19/19   Roxy Horseman, PA-C  clonazePAM (KLONOPIN) 0.5 MG tablet Take 0.5 mg by mouth at bedtime.    [provider]  pantoprazole (PROTONIX) 40 MG tablet Take 1 tablet (40 mg total) by  mouth daily. 03/14/19 03/13/20  Mansouraty, Netty Starring., MD  TRAZODONE HCL PO Take by mouth.    [provider]  modafinil (PROVIGIL) 100 MG tablet Take 50-100 mg by mouth See admin instructions. Take 1/2 to 1 tablet every morning and at noon 05/13/18 08/10/19  [provider]    Family History Family History  Problem Relation Age of Onset  . Alzheimer's disease Mother   . Hypertension Mother   . Diabetes Mother   . Anxiety disorder Mother   . Depression Mother   . Post-traumatic stress disorder Mother   . Irritable bowel syndrome Mother   . Parkinson's disease Mother   . Diabetes Father   . Liver disease Maternal Grandfather   . Pancreatic cancer Maternal Uncle   . Stomach cancer Maternal Uncle   . Diabetes Maternal Uncle   . Colon cancer Maternal Uncle   . Esophageal cancer Maternal Uncle   . Anesthesia problems Neg Hx   . Hypotension Neg Hx   . Malignant hyperthermia Neg Hx   . Pseudochol deficiency Neg Hx     Social History Social History   Tobacco Use  . Smoking status: Current Every Day Smoker    Packs/day: 1.00  . Smokeless tobacco: Never Used  Vaping Use  . Vaping Use: Never used  Substance Use Topics  . Alcohol use: Yes    Comment: occasional  . Drug use: Never     Allergies   Ketorolac tromethamine, Amoxicillin, Penicillins, and Tramadol   Review of Systems Review of Systems   Physical Exam Triage Vital Signs ED Triage Vitals  Enc Vitals Group     BP 09/12/20 1340 130/85     Pulse Rate 09/12/20 1340 (!) 101     Resp 09/12/20 1340 19     Temp 09/12/20 1340 99 F (37.2 C)     Temp src --      SpO2 09/12/20 1340 98 %     Weight --      Height --      Head Circumference --      Peak Flow --      Pain Score 09/12/20 1339 9     Pain Loc --      Pain Edu? --      Excl. in [place]? --    No data found.  Updated Vital Signs BP 130/85   Pulse (!) 101   Temp 99 F (37.2 C)   Resp 19   SpO2 98%   Visual Acuity Right Eye  Distance:   Left Eye Distance:   Bilateral Distance:    Right Eye Near:   Left Eye Near:    Bilateral Near:     Physical Exam Constitutional:      General: She is not in acute distress.    Appearance: She is well-developed. She is ill-appearing.  Cardiovascular:     Rate  and Rhythm: Normal rate.  Pulmonary:     Effort: Pulmonary effort is normal.  Skin:    General: Skin is warm and dry.  Neurological:     Mental Status: She is alert and oriented to person, place, and time.      UC Treatments / Results  Labs (all labs ordered are listed, but only abnormal results are displayed) Labs Reviewed  SARS CORONAVIRUS 2 (TAT 6-24 HRS)    EKG   Radiology No results found.  Procedures Procedures (including critical care time)  Medications Ordered in UC Medications  metoCLOPramide (REGLAN) injection 5 mg (5 mg Intramuscular Given 09/12/20 1503)  diphenhydrAMINE (BENADRYL) capsule 25 mg (25 mg Oral Given 09/12/20 1502)  acetaminophen (TYLENOL) tablet 975 mg (975 mg Oral Given 09/12/20 1502)    Initial Impression / Assessment and Plan / UC Course  I have reviewed the triage vital signs and the nursing notes.  Pertinent labs & imaging results that were available during my care of the patient were reviewed by me and considered in my medical decision making (see chart for details).     Non toxic. Benign physical exam.  History and physical consistent with viral illness.  Migraine cocktail provided here today. Supportive cares recommended. Covid testing pending and isolation instructions provided.  Known exposure. Return precautions provided.  Patient verbalized understanding and agreeable to plan.   Final Clinical Impressions(s) / UC Diagnoses   Final diagnoses:  Suspected COVID-19 virus infection  Bad headache  Nausea and vomiting, intractability of vomiting not specified, unspecified vomiting type     Discharge Instructions     Small frequent sips of fluids- Pedialyte,  Gatorade, water, broth- to maintain hydration.   Zofran every 8 hours as needed for nausea or vomiting.   You may try some vitamins to help your immune system potentially:  Vitamin C 500mg  twice a day. Zinc 50mg  daily. Vitamin D 5000IU daily.   Tylenol and/or ibuprofen as needed for pain or fevers.  Self isolate until covid results are back.  Will notify you by phone of any positive findings. Your negative results will be sent through your MyChart.     If symptoms worsen or do not improve in the next week to return to be seen or to follow up with your PCP.      ED Prescriptions    Medication Sig Dispense Auth. Provider   ondansetron (ZOFRAN-ODT) 4 MG disintegrating tablet Take 1 tablet (4 mg total) by mouth every 8 (eight) hours as needed for nausea or vomiting. 12 tablet Zigmund Gottron, NP     PDMP not reviewed this encounter.   Zigmund Gottron, NP 09/12/20 [id number]

## 2020-09-12 NOTE — ED Triage Notes (Addendum)
Pt in with c/o headache, body aches, loss of appetite and vomitng that has been going on for 4 days now. States she can't taste anything and has been exposed to covid  Pt has taken ibuprofen for sxs

## 2020-09-12 NOTE — Discharge Instructions (Addendum)
Small frequent sips of fluids- Pedialyte, Gatorade, water, broth- to maintain hydration.   Zofran every 8 hours as needed for nausea or vomiting.   You may try some vitamins to help your immune system potentially:  Vitamin C 500mg  twice a day. Zinc 50mg  daily. Vitamin D 5000IU daily.   Tylenol and/or ibuprofen as needed for pain or fevers.  Self isolate until covid results are back.  Will notify you by phone of any positive findings. Your negative results will be sent through your MyChart.     If symptoms worsen or do not improve in the next week to return to be seen or to follow up with your PCP.

## 2020-10-22 ENCOUNTER — Other Ambulatory Visit: Payer: Self-pay | Admitting: Family Medicine

## 2020-10-22 DIAGNOSIS — R52 Pain, unspecified: Secondary | ICD-10-CM

## 2020-10-22 DIAGNOSIS — R110 Nausea: Secondary | ICD-10-CM

## 2020-10-31 ENCOUNTER — Ambulatory Visit
Admission: RE | Admit: 2020-10-31 | Discharge: 2020-10-31 | Disposition: A | Discharge status: Home / Self Care | Payer: Medicaid Other | Source: Ambulatory Visit | Attending: Family Medicine | Admitting: Family Medicine

## 2020-10-31 DIAGNOSIS — R110 Nausea: Secondary | ICD-10-CM

## 2020-10-31 DIAGNOSIS — R52 Pain, unspecified: Secondary | ICD-10-CM

## 2020-11-07 ENCOUNTER — Other Ambulatory Visit: Payer: Medicaid Other

## 2020-12-04 ENCOUNTER — Ambulatory Visit: Payer: Medicaid Other | Admitting: Nurse Practitioner

## 2021-01-02 ENCOUNTER — Ambulatory Visit: Payer: Medicaid Other | Admitting: Nurse Practitioner

## 2021-03-01 ENCOUNTER — Ambulatory Visit (HOSPITAL_COMMUNITY)
Admission: RE | Admit: 2021-03-01 | Discharge: 2021-03-01 | Disposition: A | Discharge status: Home / Self Care | Payer: Medicaid Other | Source: Ambulatory Visit | Attending: Emergency Medicine | Admitting: Emergency Medicine

## 2021-03-01 ENCOUNTER — Other Ambulatory Visit: Payer: Self-pay

## 2021-03-01 ENCOUNTER — Encounter (HOSPITAL_COMMUNITY): Payer: Self-pay

## 2021-03-01 VITALS — BP 128/93 | HR 103 | Temp 99.1°F

## 2021-03-01 DIAGNOSIS — F17210 Nicotine dependence, cigarettes, uncomplicated: Secondary | ICD-10-CM | POA: Insufficient documentation

## 2021-03-01 DIAGNOSIS — Z79899 Other long term (current) drug therapy: Secondary | ICD-10-CM | POA: Diagnosis not present

## 2021-03-01 DIAGNOSIS — M5010 Cervical disc disorder with radiculopathy, unspecified cervical region: Secondary | ICD-10-CM

## 2021-03-01 DIAGNOSIS — Z880 Allergy status to penicillin: Secondary | ICD-10-CM | POA: Insufficient documentation

## 2021-03-01 DIAGNOSIS — B349 Viral infection, unspecified: Secondary | ICD-10-CM | POA: Diagnosis not present

## 2021-03-01 DIAGNOSIS — R0602 Shortness of breath: Secondary | ICD-10-CM | POA: Diagnosis present

## 2021-03-01 DIAGNOSIS — Z888 Allergy status to other drugs, medicaments and biological substances status: Secondary | ICD-10-CM | POA: Insufficient documentation

## 2021-03-01 DIAGNOSIS — R079 Chest pain, unspecified: Secondary | ICD-10-CM

## 2021-03-01 DIAGNOSIS — Z20822 Contact with and (suspected) exposure to covid-19: Secondary | ICD-10-CM | POA: Diagnosis not present

## 2021-03-01 DIAGNOSIS — Z885 Allergy status to narcotic agent status: Secondary | ICD-10-CM | POA: Diagnosis not present

## 2021-03-01 HISTORY — DX: Hepatomegaly, not elsewhere classified: R16.0

## 2021-03-01 MED ORDER — NAPROXEN 500 MG PO TABS: 500.0000 mg | ORAL_TABLET | Freq: Two times a day (BID) | ORAL | 0 refills | Status: DC

## 2021-03-01 NOTE — ED Provider Notes (Signed)
[facility]    CSN: [id number] Arrival date & time: 03/01/21  1739      History   Chief Complaint Chief Complaint  Patient presents with   Shortness of Breath   Chest Pain   Weakness   Arm Pain    left   Shoulder Pain    left    HPI Mary Hogan is a 40 y.o. female.   Patient here for evaluation of nasal congestion, cough, shortness of breath, weakness, and fatigue that has been ongoing since Sunday.  Also reports having some left shoulder and neck pain that radiates down her left arm.  Reports using Tylenol and ibuprofen for pain relief and lidocaine patches on her shoulder.  Reports family members recently tested positive for COVID but home tests have been negative.  Denies any trauma, injury, or other precipitating event.  Denies any specific alleviating or aggravating factors.  Denies any N/V/D, numbness, tingling, weakness, abdominal pain.     The history is provided by the patient.  Shortness of Breath Associated symptoms: chest pain, fever, headaches and neck pain   Associated symptoms: no abdominal pain, no ear pain, no sore throat and no vomiting   Chest Pain Associated symptoms: fatigue, fever, headache, shortness of breath and weakness   Associated symptoms: no abdominal pain, no nausea and no vomiting   Weakness Associated symptoms: arthralgias, chest pain, fever, headaches and shortness of breath   Associated symptoms: no abdominal pain, no diarrhea, no nausea and no vomiting   Arm Pain Associated symptoms include chest pain, headaches and shortness of breath. Pertinent negatives include no abdominal pain.  Shoulder Pain Associated symptoms: fatigue, fever and neck pain    Past Medical History:  Diagnosis Date   Abnormal Pap smear    Anxiety    Depression    Enlarged liver    Headache(784.0)    IBS (irritable bowel syndrome)    Scoliosis     Patient Active Problem List   Diagnosis Date Noted   Nausea without vomiting 12/26/2018    Abdominal pain 09/24/2018   Abnormal CT of the abdomen 09/24/2018   Other constipation 09/24/2018   Change in bowel habits 09/24/2018   Anxiety 07/07/2018   Encounter for blood transfusion 07/07/2018   Scoliosis 07/07/2018   Psychotic disorder ([place]) 01/20/2018   Methamphetamine abuse ([place]) 10/27/2017   UTI due to extended-spectrum beta lactamase (ESBL) producing Escherichia coli 06/09/2016   Bipolar affective disorder, currently manic, moderate ([place]) 06/05/2016   URI (upper respiratory infection) 12/23/2013   Attention deficit disorder 06/14/2013   Generalized anxiety disorder 06/14/2013   Essential (primary) hypertension 05/02/2013   SOB (shortness of breath) 05/02/2013   Chest pain 03/08/2012   MVP (mitral valve prolapse) 03/08/2012   Palpitations 03/08/2012   Hx of migraine headaches 02/19/2012   Polysubstance overdose 12/25/2011   Bipolar disorder current episode depressed ([place]) 12/06/2011    Class: Acute   IBS (irritable bowel syndrome) 10/13/2011   Epigastric pain 08/27/2011   Chronic pain 03/05/2011   Migraine 03/05/2011   TOBACCO USER 05/26/2009   AMENORRHEA, SECONDARY 01/26/2007   DEPRESSIVE DISORDER, NOS 11/05/2006   RESTLESS LEGS SYNDROME 11/05/2006   PAPANICOLAOU SMEAR, ABNORMAL 11/05/2006   PAPANICOLAOU SMEAR, ABNORMAL 11/05/2006    Past Surgical History:  Procedure Laterality Date   BIOPSY  03/14/2019   Procedure: BIOPSY;  Surgeon: Irving Copas., MD;  Location: [place];  Service: Gastroenterology;;   BREAST ENHANCEMENT SURGERY  2008   DILATION AND CURETTAGE OF  UTERUS     ENDOMETRIAL ABLATION     ESOPHAGOGASTRODUODENOSCOPY (EGD) WITH PROPOFOL N/A 03/14/2019   Procedure: ESOPHAGOGASTRODUODENOSCOPY (EGD) WITH PROPOFOL;  Surgeon: Rush Landmark Telford Nab., MD;  Location: [place];  Service: Gastroenterology;  Laterality: N/A;   EUS N/A 03/14/2019   Procedure: UPPER ENDOSCOPIC ULTRASOUND (EUS) RADIAL;  Surgeon: Irving Copas., MD;  Location:  [place];  Service: Gastroenterology;  Laterality: N/A;   FINE NEEDLE ASPIRATION  03/14/2019   Procedure: FINE NEEDLE ASPIRATION (FNA) LINEAR;  Surgeon: Irving Copas., MD;  Location: [place];  Service: Gastroenterology;;   LAPAROSCOPIC OVARIAN CYSTECTOMY  2012   TUBAL LIGATION      OB History     Gravida  9   Para  4   Term  2   Preterm  2   AB  5   Living  4      SAB  2   IAB  2   Ectopic  1   Multiple      Live Births               Home Medications    Prior to Admission medications   Medication Sig Start Date End Date Taking? Authorizing Provider  naproxen (NAPROSYN) 500 MG tablet Take 1 tablet (500 mg total) by mouth 2 (two) times daily. 03/01/21  Yes Pearson Forster, NP  ciprofloxacin (CIPRO) 500 MG tablet Take 1 tablet (500 mg total) by mouth 2 (two) times daily. 10/19/19   Montine Circle, PA-C  clonazePAM (KLONOPIN) 0.5 MG tablet Take 0.5 mg by mouth at bedtime.    [provider]  ondansetron (ZOFRAN-ODT) 4 MG disintegrating tablet Take 1 tablet (4 mg total) by mouth every 8 (eight) hours as needed for nausea or vomiting. 09/12/20   Zigmund Gottron, NP  pantoprazole (PROTONIX) 40 MG tablet Take 1 tablet (40 mg total) by mouth daily. 03/14/19 03/13/20  Mansouraty, Telford Nab., MD  TRAZODONE HCL PO Take by mouth.    [provider]  modafinil (PROVIGIL) 100 MG tablet Take 50-100 mg by mouth See admin instructions. Take 1/2 to 1 tablet every morning and at noon 05/13/18 08/10/19  [provider]    Family History Family History  Problem Relation Age of Onset   Alzheimer's disease Mother    Hypertension Mother    Diabetes Mother    Anxiety disorder Mother    Depression Mother    Post-traumatic stress disorder Mother    Irritable bowel syndrome Mother    Parkinson's disease Mother    Diabetes Father    Liver disease Maternal Grandfather    Pancreatic cancer Maternal Uncle    Stomach cancer Maternal Uncle     Diabetes Maternal Uncle    Colon cancer Maternal Uncle    Esophageal cancer Maternal Uncle    Anesthesia problems Neg Hx    Hypotension Neg Hx    Malignant hyperthermia Neg Hx    Pseudochol deficiency Neg Hx     Social History Social History   Tobacco Use   Smoking status: Every Day    Packs/day: 1.00    Pack years: 0.00    Types: Cigarettes   Smokeless tobacco: Never  Vaping Use   Vaping Use: Never used  Substance Use Topics   Alcohol use: Yes    Comment: occasional   Drug use: Never     Allergies   Ketorolac tromethamine, Amoxicillin, Penicillins, and Tramadol   Review of Systems Review of Systems  Constitutional:  Positive  for chills, fatigue and fever.  HENT:  Positive for congestion. Negative for ear discharge, ear pain, facial swelling, sinus pressure, sinus pain and sore throat.   Respiratory:  Positive for shortness of breath.   Cardiovascular:  Positive for chest pain.  Gastrointestinal:  Negative for abdominal pain, diarrhea, nausea and vomiting.  Musculoskeletal:  Positive for arthralgias and neck pain. Negative for neck stiffness.  Neurological:  Positive for weakness and headaches.  All other systems reviewed and are negative.   Physical Exam Triage Vital Signs ED Triage Vitals  Enc Vitals Group     BP 03/01/21 1854 (!) 128/93     Pulse Rate 03/01/21 1854 (!) 103     Resp --      Temp 03/01/21 1854 99.1 F (37.3 C)     Temp Source 03/01/21 1854 Oral     SpO2 03/01/21 1854 100 %     Weight --      Height --      Head Circumference --      Peak Flow --      Pain Score 03/01/21 1852 8     Pain Loc --      Pain Edu? --      Excl. in [place]? --    No data found.  Updated Vital Signs BP (!) 128/93 (BP Location: Right Arm)   Pulse (!) 103   Temp 99.1 F (37.3 C) (Oral)   SpO2 100%   Visual Acuity Right Eye Distance:   Left Eye Distance:   Bilateral Distance:    Right Eye Near:   Left Eye Near:    Bilateral Near:     Physical  Exam Vitals and nursing note reviewed.  Constitutional:      General: She is not in acute distress.    Appearance: Normal appearance. She is not ill-appearing, toxic-appearing or diaphoretic.  HENT:     Head: Normocephalic and atraumatic.  Eyes:     Conjunctiva/sclera: Conjunctivae normal.  Cardiovascular:     Rate and Rhythm: Normal rate and regular rhythm.     Pulses: Normal pulses.     Heart sounds: Normal heart sounds.  Pulmonary:     Effort: Pulmonary effort is normal.     Breath sounds: Normal breath sounds.  Abdominal:     General: Abdomen is flat.  Musculoskeletal:        General: Normal range of motion.     Cervical back: Normal range of motion.  Skin:    General: Skin is warm and dry.  Neurological:     General: No focal deficit present.     Mental Status: She is alert and oriented to person, place, and time.  Psychiatric:        Mood and Affect: Mood normal.     UC Treatments / Results  Labs (all labs ordered are listed, but only abnormal results are displayed) Labs Reviewed  SARS CORONAVIRUS 2 (TAT 6-24 HRS)    EKG   Radiology No results found.  Procedures Procedures (including critical care time)  Medications Ordered in UC Medications - No data to display  Initial Impression / Assessment and Plan / UC Course  I have reviewed the triage vital signs and the nursing notes.  Pertinent labs & imaging results that were available during my care of the patient were reviewed by me and considered in my medical decision making (see chart for details).    Assessment negative for red flags or concerns.  EKG normal sinus rhythm  with normal rate, and similar to previous EKG. Shoulder, neck, and arm pain is likely cervical radiculopathy.  Will treat with naproxen twice a day for the next 7 days.  May continue use lidocaine patches.  May use heat, ice, or alternate between heat and ice for comfort.  Make take Tylenol as needed for pain.  Congestion, headache, and  SOB likely viral illness.  COVID test pending.  Discussed with patient to quarantine while awaiting COVID test results.  Discussed conservative symptom management as described in discharge instructions.  Encourage fluids and rest.  Follow-up with primary care as needed. Final Clinical Impressions(s) / UC Diagnoses   Final diagnoses:  Cervical disc disorder with radiculopathy of cervical region  Viral illness     Discharge Instructions      For your neck and shoulder pain: Take the naproxen twice a day for the next 7 days (take with food to prevent any upset stomach).   You can continue to use lidocaine patches for comfort.   You can use Tylenol for any breakthrough pain.  You can use heat, ice, or alternate between heat and ice for comfort.   We will contact you if your COVID test is positive.  Please quarantine while you wait for the results.  If your test is negative you may resume normal activities.  If your test is positive please continue to quarantine for at least 5 days from your symptom onset or until you are without a fever for at least 24 hours after the medications.  For cough: honey 1/2 to 1 teaspoon (you can dilute the honey in water or another fluid).  You can also use guaifenesin and dextromethorphan for cough. You can use a humidifier for chest congestion and cough.  If you don't have a humidifier, you can sit in the bathroom with the hot shower running.   For sore throat: try warm salt water gargles, cepacol lozenges, throat spray, warm tea or water with lemon/honey, popsicles or ice, or OTC cold relief medicine for throat discomfort.  For congestion: take a daily anti-histamine like Zyrtec, Claritin, and a oral decongestant, such as pseudoephedrine.  You can also use Flonase 1-2 sprays in each nostril daily.    It is important to stay hydrated: drink plenty of fluids (water, gatorade/powerade/pedialyte, juices, or teas) to keep your throat moisturized and help further relieve  irritation/discomfort.   Return or go to the Emergency Department if symptoms worsen or do not improve in the next few days.      ED Prescriptions     Medication Sig Dispense Auth. Provider   naproxen (NAPROSYN) 500 MG tablet Take 1 tablet (500 mg total) by mouth 2 (two) times daily. 30 tablet Pearson Forster, NP      PDMP not reviewed this encounter.   Pearson Forster, NP 03/01/21 2009

## 2021-03-01 NOTE — ED Triage Notes (Signed)
Pt c/o SOB X Sunday. She states she has been feeling weak and tired.   She states she has been having left side chest pain and left side shoulder pain that moves to the arm.  Pt states she has been using the lidocaine patches for the shoulder pain.

## 2021-03-01 NOTE — Discharge Instructions (Signed)
For your neck and shoulder pain: Take the naproxen twice a day for the next 7 days (take with food to prevent any upset stomach).   You can continue to use lidocaine patches for comfort.   You can use Tylenol for any breakthrough pain.  You can use heat, ice, or alternate between heat and ice for comfort.   We will contact you if your COVID test is positive.  Please quarantine while you wait for the results.  If your test is negative you may resume normal activities.  If your test is positive please continue to quarantine for at least 5 days from your symptom onset or until you are without a fever for at least 24 hours after the medications.  For cough: honey 1/2 to 1 teaspoon (you can dilute the honey in water or another fluid).  You can also use guaifenesin and dextromethorphan for cough. You can use a humidifier for chest congestion and cough.  If you don't have a humidifier, you can sit in the bathroom with the hot shower running.   For sore throat: try warm salt water gargles, cepacol lozenges, throat spray, warm tea or water with lemon/honey, popsicles or ice, or OTC cold relief medicine for throat discomfort.  For congestion: take a daily anti-histamine like Zyrtec, Claritin, and a oral decongestant, such as pseudoephedrine.  You can also use Flonase 1-2 sprays in each nostril daily.    It is important to stay hydrated: drink plenty of fluids (water, gatorade/powerade/pedialyte, juices, or teas) to keep your throat moisturized and help further relieve irritation/discomfort.   Return or go to the Emergency Department if symptoms worsen or do not improve in the next few days.

## 2021-03-02 LAB — SARS CORONAVIRUS 2 (TAT 6-24 HRS): SARS Coronavirus 2: NEGATIVE

## 2021-09-22 ENCOUNTER — Other Ambulatory Visit: Payer: Self-pay

## 2021-09-22 ENCOUNTER — Emergency Department (HOSPITAL_BASED_OUTPATIENT_CLINIC_OR_DEPARTMENT_OTHER)
Admission: EM | Admit: 2021-09-22 | Discharge: 2021-09-22 | Disposition: A | Discharge status: Home / Self Care | Payer: Medicaid Other | Attending: Emergency Medicine | Admitting: Emergency Medicine

## 2021-09-22 ENCOUNTER — Emergency Department (HOSPITAL_BASED_OUTPATIENT_CLINIC_OR_DEPARTMENT_OTHER): Payer: Medicaid Other

## 2021-09-22 DIAGNOSIS — R140 Abdominal distension (gaseous): Secondary | ICD-10-CM | POA: Insufficient documentation

## 2021-09-22 DIAGNOSIS — R112 Nausea with vomiting, unspecified: Secondary | ICD-10-CM | POA: Diagnosis present

## 2021-09-22 DIAGNOSIS — R109 Unspecified abdominal pain: Secondary | ICD-10-CM | POA: Insufficient documentation

## 2021-09-22 DIAGNOSIS — G8929 Other chronic pain: Secondary | ICD-10-CM | POA: Insufficient documentation

## 2021-09-22 LAB — COMPREHENSIVE METABOLIC PANEL
ALT: 8 U/L (ref 0–44)
AST: 12 U/L — ABNORMAL LOW (ref 15–41)
Albumin: 4.1 g/dL (ref 3.5–5.0)
Alkaline Phosphatase: 69 U/L (ref 38–126)
Anion gap: 8 (ref 5–15)
BUN: 9 mg/dL (ref 6–20)
CO2: 30 mmol/L (ref 22–32)
Calcium: 8.7 mg/dL — ABNORMAL LOW (ref 8.9–10.3)
Chloride: 103 mmol/L (ref 98–111)
Creatinine, Ser: 1.26 mg/dL — ABNORMAL HIGH (ref 0.44–1.00)
GFR, Estimated: 55 mL/min — ABNORMAL LOW (ref >60)
Glucose, Bld: 95 mg/dL (ref 70–99)
Potassium: 3.3 mmol/L — ABNORMAL LOW (ref 3.5–5.1)
Sodium: 141 mmol/L (ref 135–145)
Total Bilirubin: 0.3 mg/dL (ref 0.3–1.2)
Total Protein: 7.2 g/dL (ref 6.5–8.1)

## 2021-09-22 LAB — URINALYSIS, ROUTINE W REFLEX MICROSCOPIC
Bilirubin Urine: NEGATIVE
Glucose, UA: NEGATIVE mg/dL
Hgb urine dipstick: NEGATIVE
Nitrite: NEGATIVE
Protein, ur: 30 mg/dL — AB
Specific Gravity, Urine: 1.032 — ABNORMAL HIGH (ref 1.005–1.030)
pH: 6 (ref 5.0–8.0)

## 2021-09-22 LAB — CBC
HCT: 39.4 % (ref 36.0–46.0)
Hemoglobin: 12.6 g/dL (ref 12.0–15.0)
MCH: 29.5 pg (ref 26.0–34.0)
MCHC: 32 g/dL (ref 30.0–36.0)
MCV: 92.3 fL (ref 80.0–100.0)
Platelets: 249 10*3/uL (ref 150–400)
RBC: 4.27 MIL/uL (ref 3.87–5.11)
RDW: 13.1 % (ref 11.5–15.5)
WBC: 10.6 10*3/uL — ABNORMAL HIGH (ref 4.0–10.5)
nRBC: 0 % (ref 0.0–0.2)

## 2021-09-22 LAB — PREGNANCY, URINE: Preg Test, Ur: NEGATIVE

## 2021-09-22 LAB — LIPASE, BLOOD: Lipase: 17 U/L (ref 11–51)

## 2021-09-22 MED ORDER — IOHEXOL 300 MG/ML  SOLN
100.0000 mL | Freq: Once | INTRAMUSCULAR | Status: AC | PRN
  Administered 2021-09-22: 100 mL via INTRAVENOUS

## 2021-09-22 MED ORDER — DICYCLOMINE HCL 20 MG PO TABS: 20.0000 mg | ORAL_TABLET | Freq: Two times a day (BID) | ORAL | 0 refills | Status: DC | PRN

## 2021-09-22 MED ORDER — PROMETHAZINE HCL 25 MG/ML IJ SOLN
INTRAMUSCULAR | Status: AC
  Filled 2021-09-22: qty 1

## 2021-09-22 MED ORDER — PROMETHAZINE HCL 25 MG PO TABS: 25.0000 mg | ORAL_TABLET | Freq: Three times a day (TID) | ORAL | 0 refills | Status: DC | PRN

## 2021-09-22 MED ORDER — SODIUM CHLORIDE 0.9 % IV SOLN
12.5000 mg | Freq: Four times a day (QID) | INTRAVENOUS | Status: DC | PRN
  Administered 2021-09-22: 12.5 mg via INTRAVENOUS
  Filled 2021-09-22: qty 0.5

## 2021-09-22 NOTE — Discharge Instructions (Signed)
Please call and follow-up with a gastroenterologist.  Please stick to a clear liquid diet for the next 2 days, avoiding carbonated beverages, spicy food or hot food.

## 2021-09-22 NOTE — ED Provider Notes (Signed)
[facility] EMERGENCY DEPT Provider Note   CSN: [id number] Arrival date & time: 09/22/21  1645     History  Chief Complaint  Patient presents with   Abdominal Pain    Mary Hogan is a 41 y.o. female presenting to the emergency department with abdominal distention.  The patient reports he has been having chronic abdominal pain with intermittent vomiting episodes for about 2 years.  She did have an endoscopy and colonoscopy done in 2020, which I reviewed, and per those reports were largely unremarkable, with no acute lesions in the stomach or in the colon, only some small internal hemorrhoids.  She reports she feels her abdomen has been distended for the past several days, more than normal.  She is having difficulty keeping down anything to eat or drink.  She has been trying over-the-counter acid reflux medications, she does suffer from reflux, as well as Dramamine, but nothing is helping.  She reports a history of a tubal ligation.  Denies any other abdominal surgeries.  She does report that she feels that her abdominal pain tends to flareup and is associated sometimes with dark vaginal spotting.  This can last 2 to 3 days and then resolved.  She is not having regular cycles otherwise  HPI     Home Medications Prior to Admission medications   Medication Sig Start Date End Date Taking? Authorizing Provider  dicyclomine (BENTYL) 20 MG tablet Take 1 tablet (20 mg total) by mouth 2 (two) times daily as needed for up to 20 doses for spasms. 09/22/21  Yes Curtina Grills, Carola Rhine, MD  promethazine (PHENERGAN) 25 MG tablet Take 1 tablet (25 mg total) by mouth every 8 (eight) hours as needed for up to 10 doses for nausea or vomiting. 09/22/21  Yes Brier Reid, Carola Rhine, MD  ciprofloxacin (CIPRO) 500 MG tablet Take 1 tablet (500 mg total) by mouth 2 (two) times daily. 10/19/19   Montine Circle, PA-C  clonazePAM (KLONOPIN) 0.5 MG tablet Take 0.5 mg by mouth at bedtime.    [provider]  naproxen (NAPROSYN) 500 MG tablet Take 1 tablet (500 mg total) by mouth 2 (two) times daily. 03/01/21   Pearson Forster, NP  ondansetron (ZOFRAN-ODT) 4 MG disintegrating tablet Take 1 tablet (4 mg total) by mouth every 8 (eight) hours as needed for nausea or vomiting. 09/12/20   Zigmund Gottron, NP  pantoprazole (PROTONIX) 40 MG tablet Take 1 tablet (40 mg total) by mouth daily. 03/14/19 03/13/20  Mansouraty, Telford Nab., MD  TRAZODONE HCL PO Take by mouth.    [provider]  modafinil (PROVIGIL) 100 MG tablet Take 50-100 mg by mouth See admin instructions. Take 1/2 to 1 tablet every morning and at noon 05/13/18 08/10/19  [provider]      Allergies    Ketorolac tromethamine, Amoxicillin, Penicillins, and Tramadol    Review of Systems   Review of Systems  Physical Exam Updated Vital Signs BP 118/74    Pulse 72    Temp 98.3 F (36.8 C)    Resp 11    Ht 5\' 6"  (1.676 m)    Wt 72.6 kg    SpO2 100%    BMI 25.82 kg/m  Physical Exam Constitutional:      General: She is not in acute distress. HENT:     Head: Normocephalic and atraumatic.  Eyes:     Conjunctiva/sclera: Conjunctivae normal.     Pupils: Pupils are equal, round, and reactive to light.  Cardiovascular:     Rate and Rhythm: Normal rate and regular rhythm.     Heart sounds: Normal heart sounds.  Pulmonary:     Effort: Pulmonary effort is normal. No respiratory distress.  Abdominal:     General: There is distension.     Tenderness: There is no abdominal tenderness. There is no guarding or rebound. Negative signs include Murphy's sign.  Skin:    General: Skin is warm and dry.  Neurological:     General: No focal deficit present.     Mental Status: She is alert. Mental status is at baseline.  Psychiatric:        Mood and Affect: Mood normal.        Behavior: Behavior normal.    ED Results / Procedures / Treatments   Labs (all labs ordered are listed, but only abnormal results are  displayed) Labs Reviewed  COMPREHENSIVE METABOLIC PANEL - Abnormal; Notable for the following components:      Result Value   Potassium 3.3 (*)    Creatinine, Ser 1.26 (*)    Calcium 8.7 (*)    AST 12 (*)    GFR, Estimated 55 (*)    All other components within normal limits  CBC - Abnormal; Notable for the following components:   WBC 10.6 (*)    All other components within normal limits  URINALYSIS, ROUTINE W REFLEX MICROSCOPIC - Abnormal; Notable for the following components:   APPearance HAZY (*)    Specific Gravity, Urine 1.032 (*)    Ketones, ur TRACE (*)    Protein, ur 30 (*)    Leukocytes,Ua TRACE (*)    Bacteria, UA RARE (*)    All other components within normal limits  LIPASE, BLOOD  PREGNANCY, URINE    EKG None  Radiology CT ABDOMEN PELVIS W CONTRAST  Result Date: 09/22/2021 CLINICAL DATA:  Patient reports the left side of her abdomen hurts and feels swollen and she has pain shooting into her back. Patient reports nausea. Patient has been taking dramamine but still has some emesis EXAM: CT ABDOMEN AND PELVIS WITH CONTRAST TECHNIQUE: Multidetector CT imaging of the abdomen and pelvis was performed using the standard protocol following bolus administration of intravenous contrast. RADIATION DOSE REDUCTION: This exam was performed according to the departmental dose-optimization program which includes automated exposure control, adjustment of the mA and/or kV according to patient size and/or use of iterative reconstruction technique. CONTRAST:  138mL OMNIPAQUE IOHEXOL 300 MG/ML  SOLN COMPARISON:  06/20/2021. FINDINGS: Lower chest: Clear lung bases. Hepatobiliary: No focal liver abnormality is seen. No gallstones, gallbladder wall thickening, or biliary dilatation. Pancreas: Unremarkable. No pancreatic ductal dilatation or surrounding inflammatory changes. Spleen: Normal in size without focal abnormality. Adrenals/Urinary Tract: Adrenal glands are unremarkable. Kidneys are normal,  without renal calculi, focal lesion, or hydronephrosis. Bladder is unremarkable. Stomach/Bowel: Normal stomach. Small bowel and colon are normal in caliber. No wall thickening. No inflammation. Mild increase in the colonic stool burden. Normal appendix visualized. Vascular/Lymphatic: Minor infrarenal abdominal aortic atherosclerosis. No aneurysm. No other vascular abnormality. No enlarged lymph nodes. Reproductive: Uterus and bilateral adnexa are unremarkable. Other: No abdominal wall hernia or abnormality. No abdominopelvic ascites. Musculoskeletal: No acute or significant osseous findings. IMPRESSION: 1. No acute findings within the abdomen or pelvis. No findings to account for the patient's abdominal discomfort and sensation of swelling. Specifically, no bowel obstruction or inflammation. 2. Mild increased stool burden in the colon. Minimal aortic atherosclerosis. No other abnormalities. 3. Normal appendix visualized. Electronically  Signed   By: Lajean Manes M.D.   On: 09/22/2021 18:49    Procedures Procedures    Medications Ordered in ED Medications  iohexol (OMNIPAQUE) 300 MG/ML solution 100 mL (100 mLs Intravenous Contrast Given 09/22/21 1833)    ED Course/ Medical Decision Making/ A&P Clinical Course as of 09/23/21 0258  Sun Sep 22, 2021  2210 Patient was able to tolerate p.o. easily here, feels better after Phenergan.  I will prescribe Phenergan for home.  Advise follow-up with the gastroenterologist she has seen in the past.  She verbalized understanding.  Advised that she stick to a clear liquid diet for the next 2 days, avoid carbonated beverages.  She does not smoke marijuana.  We can also prescribe Bentyl which may help with some cramping [MT]    Clinical Course User Index [MT] Dafna Romo, Carola Rhine, MD                           Medical Decision Making  This patient presents to the Emergency Department with complaint of abdominal pain. This involves an extensive number of treatment  options, and is a complaint that carries with it a high risk of complications and morbidity.  The differential diagnosis includes, but is not limited to, gastritis vs biliary disease vs peptic ulcer vs constipation vs colitis vs UTI vs other  I ordered, reviewed, and interpreted labs, including BMP and CBC.  There were no immediate, life-threatening emergencies found in this labwork.  Patient's UA showed no signs of infection I ordered medication phenergan for abdominal pain and/or nausea I ordered imaging studies which included ct abdomen pelvis  Previous records obtained and reviewed showing outpatient EGD/colonoscopy in 2020 - largely unremarkable -no gastritic ulcers or diverticulosis noted  Low suspicion for ovarian torsion s/p tubal ligation - did not feel emergent ultrasound needed Low suspicion for acute biliary disease  After the interventions stated above, I reevaluated the patient and found that they remained clinically stable; nausea improved; tolerating PO  Based on the patient's clinical exam, vital signs, risk factors, and ED testing, I felt that the patient's overall risk of life-threatening emergency such as bowel perforation, surgical emergency, or sepsis was quite low.  I suspect this clinical presentation is most consistent with nonspecific abdominal pain and nausea, but explained to the patient that this evaluation was not a definitive diagnostic workup.  Clinical Course as of 09/23/21 5277  Sun Sep 22, 2021  2210 Patient was able to tolerate p.o. easily here, feels better after Phenergan.  I will prescribe Phenergan for home.  Advise follow-up with the gastroenterologist she has seen in the past.  She verbalized understanding.  Advised that she stick to a clear liquid diet for the next 2 days, avoid carbonated beverages.  She does not smoke marijuana.  We can also prescribe Bentyl which may help with some cramping [MT]    Clinical Course User Index [MT] Jesua Tamblyn, Carola Rhine, MD              Final Clinical Impression(s) / ED Diagnoses Final diagnoses:  Nausea and vomiting, unspecified vomiting type    Rx / DC Orders ED Discharge Orders          Ordered    dicyclomine (BENTYL) 20 MG tablet  2 times daily PRN        09/22/21 2211    promethazine (PHENERGAN) 25 MG tablet  Every 8 hours PRN  09/22/21 2211              Wyvonnia Dusky, MD 09/23/21 [phone]

## 2021-09-22 NOTE — ED Notes (Signed)
Dr Langston Masker in room w/pt now.

## 2021-09-22 NOTE — ED Triage Notes (Signed)
Patient reports the left side of her abdomen hurts and feels swollen and she has pain shooting into her back. Patient reports nausea. Patient has been taking dramamine but still has some emesis.

## 2021-09-22 NOTE — ED Notes (Signed)
Pt teaching provided on medications that may cause drowsiness. Pt instructed not to drive or operate heavy machinery while taking the prescribed medication. Pt verbalized understanding.  ? ?Pt provided discharge instructions and prescription information. Pt was given the opportunity to ask questions and questions were answered. Discharge signature not obtained in the setting of the COVID-19 pandemic in order to reduce high touch surfaces.  ? ?

## 2021-09-22 NOTE — ED Notes (Signed)
Pt given coke to PO challenge at this time.

## 2021-10-14 ENCOUNTER — Encounter: Payer: Self-pay | Admitting: Physician Assistant

## 2021-10-14 ENCOUNTER — Ambulatory Visit: Payer: Medicaid Other | Admitting: Physician Assistant

## 2021-10-14 VITALS — BP 94/66 | HR 100 | Ht 65.35 in | Wt 194.2 lb

## 2021-10-14 DIAGNOSIS — R635 Abnormal weight gain: Secondary | ICD-10-CM

## 2021-10-14 DIAGNOSIS — R6881 Early satiety: Secondary | ICD-10-CM

## 2021-10-14 DIAGNOSIS — K219 Gastro-esophageal reflux disease without esophagitis: Secondary | ICD-10-CM

## 2021-10-14 DIAGNOSIS — R112 Nausea with vomiting, unspecified: Secondary | ICD-10-CM

## 2021-10-14 DIAGNOSIS — R1013 Epigastric pain: Secondary | ICD-10-CM

## 2021-10-14 MED ORDER — PROMETHAZINE HCL 25 MG PO TABS: 25.0000 mg | ORAL_TABLET | Freq: Three times a day (TID) | ORAL | 2 refills | Status: DC | PRN

## 2021-10-14 MED ORDER — DICYCLOMINE HCL 20 MG PO TABS: 20.0000 mg | ORAL_TABLET | Freq: Two times a day (BID) | ORAL | 11 refills | Status: DC | PRN

## 2021-10-14 NOTE — Patient Instructions (Signed)
PROCEDURES: You have been scheduled for a EGD. Please follow the written instructions given to you at your visit today. If you use inhalers (even only as needed), please bring them with you on the day of your procedure.  Your medications has been refilled.  We have given you a note for work today.  BMI:  If you are age 43 or older, your body mass index should be between 23-30. Your Body mass index is 31.98 kg/m. If this is out of the aforementioned range listed, please consider follow up with your Primary Care Provider.  If you are age 47 or younger, your body mass index should be between 19-25. Your Body mass index is 31.98 kg/m. If this is out of the aformentioned range listed, please consider follow up with your Primary Care Provider.   MY CHART:  The [facility] providers would like to encourage you to use [facility] to communicate with providers for non-urgent requests or questions.  Due to long hold times on the telephone, sending your provider a message by [facility] may be a faster and more efficient way to get a response.  Please allow 48 business hours for a response.  Please remember that this is for non-urgent requests.   Thank you for trusting me with your gastrointestinal care!    Ellouise Newer, [place]

## 2021-10-14 NOTE — Progress Notes (Signed)
Chief Complaint: Follow-up ER visit for abdominal pain  HPI:    Mary Hogan is a 41 year old female with a past medical history of anxiety, depression, enlarged liver and IBS, known to Dr. Rush Landmark, who presents to clinic today for follow-up after being seen in the ER for abdominal pain.    10/2018 colonoscopy with nonbleeding nonthrombosed internal hemorrhoids and otherwise normal.  EGD at the same time with normal mucosa throughout the duodenum into the third portion of the duodenum and fourth portion of the duodenum and otherwise normal.    12/23/2018 office visit with Dr. Rush Landmark for chronic abdominal pain.  At that time discussed that patient could continue Zofran, Colace, fiber supplement and MiraLAX.  She was scheduled for diagnostic EUS to evaluate the region surrounding the pancreatic head seen on recent CT that was inflamed.    03/14/2019 EUS showed 2 inflamed lymph nodes and was otherwise normal.  Patient restarted on Pantoprazole 40 daily.  Pathology showed no abnormalities other than some mild nonspecific reactive gastropathy.    09/22/2021 patient seen in the ER for abdominal distention.  Described chronic abdominal pain with intermittent vomiting episodes for 2 years.  Discussed previous EGD and colonoscopy in 2020 which were unremarkable.  At that time discussed some association between dark vaginal spotting and abdominal pain flares which can last 2 to 3 days.  CMP with a potassium of 3.3, creatinine 1.26, CBC with a white count of 10.6, normal lipase.  CT abdomen pelvis with contrast showed no acute findings, mild increased stool burden in the colon and otherwise normal.  She was given Phenergan and able to tolerate p.o.  She was prescribed Phenergan to take home as well as Dicyclomine 20 mg twice daily.    Today, the patient presents to clinic and tells me that really for the past 2 years she has continued with abdominal pain and symptoms but more acutely over the past few months  she has been to the ER on 2 separate occasions for increasing symptoms including a feeling of being really full after only a few bites of food as well as a "knot" near the top of her stomach and pain that radiates from the front of her abdomen around to the back.  She tells me that sometimes it feels like a tenderness and other times it feels like a "burning".  Rated as a 10/10 at it's worst. Also describes a lot of bloating and a weight gain of around 40 pounds over this time.  She was using Dicyclomine 20 mg twice a day from the ER and felt like they helped a little bit as well as Phenergan which she was taking once a day.    Denies fever, chills, weight loss, blood in her stool or change in bowel habits.  Past Medical History:  Diagnosis Date   Abnormal Pap smear    Anxiety    Depression    Enlarged liver    Headache(784.0)    IBS (irritable bowel syndrome)    Scoliosis     Past Surgical History:  Procedure Laterality Date   BIOPSY  03/14/2019   Procedure: BIOPSY;  Surgeon: Rush Landmark Telford Nab., MD;  Location: [facility];  Service: Gastroenterology;;   BREAST ENHANCEMENT SURGERY  2008   DILATION AND CURETTAGE OF UTERUS     ENDOMETRIAL ABLATION     ESOPHAGOGASTRODUODENOSCOPY (EGD) WITH PROPOFOL N/A 03/14/2019   Procedure: ESOPHAGOGASTRODUODENOSCOPY (EGD) WITH PROPOFOL;  Surgeon: Irving Copas., MD;  Location: [facility];  Service:  Gastroenterology;  Laterality: N/A;   EUS N/A 03/14/2019   Procedure: UPPER ENDOSCOPIC ULTRASOUND (EUS) RADIAL;  Surgeon: Irving Copas., MD;  Location: [facility];  Service: Gastroenterology;  Laterality: N/A;   FINE NEEDLE ASPIRATION  03/14/2019   Procedure: FINE NEEDLE ASPIRATION (FNA) LINEAR;  Surgeon: Irving Copas., MD;  Location: [facility];  Service: Gastroenterology;;   LAPAROSCOPIC OVARIAN CYSTECTOMY  2012   TUBAL LIGATION      Current Outpatient Medications  Medication Sig Dispense Refill   ciprofloxacin (CIPRO)  500 MG tablet Take 1 tablet (500 mg total) by mouth 2 (two) times daily. 28 tablet 0   clonazePAM (KLONOPIN) 0.5 MG tablet Take 0.5 mg by mouth at bedtime.     dicyclomine (BENTYL) 20 MG tablet Take 1 tablet (20 mg total) by mouth 2 (two) times daily as needed for up to 20 doses for spasms. 20 tablet 0   naproxen (NAPROSYN) 500 MG tablet Take 1 tablet (500 mg total) by mouth 2 (two) times daily. 30 tablet 0   ondansetron (ZOFRAN-ODT) 4 MG disintegrating tablet Take 1 tablet (4 mg total) by mouth every 8 (eight) hours as needed for nausea or vomiting. 12 tablet 0   pantoprazole (PROTONIX) 40 MG tablet Take 1 tablet (40 mg total) by mouth daily. 30 tablet 4   promethazine (PHENERGAN) 25 MG tablet Take 1 tablet (25 mg total) by mouth every 8 (eight) hours as needed for up to 10 doses for nausea or vomiting. 10 tablet 0   TRAZODONE HCL PO Take by mouth.     No current facility-administered medications for this visit.    Allergies as of 10/14/2021 - Review Complete 09/22/2021  Allergen Reaction Noted   Ketorolac tromethamine Other (See Comments) 01/02/2016   Amoxicillin Hives 10/28/2011   Penicillins Hives 12/05/2011   Tramadol Hives 01/02/2016    Family History  Problem Relation Age of Onset   Alzheimer's disease Mother    Hypertension Mother    Diabetes Mother    Anxiety disorder Mother    Depression Mother    Post-traumatic stress disorder Mother    Irritable bowel syndrome Mother    Parkinson's disease Mother    Diabetes Father    Liver disease Maternal Grandfather    Pancreatic cancer Maternal Uncle    Stomach cancer Maternal Uncle    Diabetes Maternal Uncle    Colon cancer Maternal Uncle    Esophageal cancer Maternal Uncle    Anesthesia problems Neg Hx    Hypotension Neg Hx    Malignant hyperthermia Neg Hx    Pseudochol deficiency Neg Hx     Social History   Socioeconomic History   Marital status: Single    Spouse name: Not on file   Number of children: 4   Years of  education: Not on file   Highest education level: Not on file  Occupational History   Occupation: server  Tobacco Use   Smoking status: Every Day    Packs/day: 1.00    Types: Cigarettes   Smokeless tobacco: Never  Vaping Use   Vaping Use: Never used  Substance and Sexual Activity   Alcohol use: Yes    Comment: occasional   Drug use: Never   Sexual activity: Yes    Birth control/protection: Surgical  Other Topics Concern   Not on file  Social History Narrative   Not on file   Social Determinants of Health   Financial Resource Strain: Not on file  Food Insecurity: Not on file  Transportation Needs: Not on file  Physical Activity: Not on file  Stress: Not on file  Social Connections: Not on file  Intimate Partner Violence: Not on file    Review of Systems:    Constitutional: No weight loss, fever or chills Cardiovascular: No chest pain   Respiratory: No SOB Gastrointestinal: See HPI and otherwise negative   Physical Exam:  Vital signs: BP 94/66 (BP Location: Left Arm, Patient Position: Sitting, Cuff Size: Normal)    Pulse 100    Ht 5' 5.35" (1.66 m) Comment: height measured wihtout shoes   Wt 194 lb 4 oz (88.1 kg)    BMI 31.98 kg/m    Constitutional:   Pleasant overweight Caucasian female appears to be in NAD, Well developed, Well nourished, alert and cooperative Respiratory: Respirations even and unlabored. Lungs clear to auscultation bilaterally.   No wheezes, crackles, or rhonchi.  Cardiovascular: Normal S1, S2. No MRG. Regular rate and rhythm. No peripheral edema, cyanosis or pallor.  Gastrointestinal:  Soft, nondistended, moderate epigastric TTP. No rebound or guarding. Normal bowel sounds. No appreciable masses or hepatomegaly. Rectal:  Not performed.  Psychiatric: Oriented to person, place and time. Demonstrates good judgement and reason without abnormal affect or behaviors.  RELEVANT LABS AND IMAGING: CBC    Component Value Date/Time   WBC 10.6 (H)  09/22/2021 1707   RBC 4.27 09/22/2021 1707   HGB 12.6 09/22/2021 1707   HCT 39.4 09/22/2021 1707   PLT 249 09/22/2021 1707   MCV 92.3 09/22/2021 1707   MCH 29.5 09/22/2021 1707   MCHC 32.0 09/22/2021 1707   RDW 13.1 09/22/2021 1707   LYMPHSABS 2.5 03/23/2017 0120   MONOABS 1.0 03/23/2017 0120   EOSABS 0.2 03/23/2017 0120   BASOSABS 0.0 03/23/2017 0120    CMP     Component Value Date/Time   NA 141 09/22/2021 1707   K 3.3 (L) 09/22/2021 1707   CL 103 09/22/2021 1707   CO2 30 09/22/2021 1707   GLUCOSE 95 09/22/2021 1707   BUN 9 09/22/2021 1707   CREATININE 1.26 (H) 09/22/2021 1707   CALCIUM 8.7 (L) 09/22/2021 1707   PROT 7.2 09/22/2021 1707   ALBUMIN 4.1 09/22/2021 1707   AST 12 (L) 09/22/2021 1707   ALT 8 09/22/2021 1707   ALKPHOS 69 09/22/2021 1707   BILITOT 0.3 09/22/2021 1707   GFRNONAA 55 (L) 09/22/2021 1707   GFRAA >60 10/18/2019 1816    Assessment: 1.  Epigastric pain: Increased with early satiety and nausea over the past few months, previous EGD in 2020 was completely normal as well as EUS other than some mild gastritis for which patient was previously on Pantoprazole 40 daily; consider PUD +/- gastritis +/- H. pylori 2.  Early satiety 3.  Nausea 4.  Weight gain  Plan: 1.  Scheduled patient for repeat EGD for increase in symptoms and new early satiety.  This was scheduled with Dr. Rush Landmark in the [facility].  Did provide the patient a detailed list of risks for the procedure and she agrees to proceed. Patient is appropriate for endoscopic procedure(s) in the ambulatory ([facility]) setting.  2.  Prescribed Pantoprazole 40 mg twice daily, 30-60 minutes before breakfast and again before dinner #60 with 3 refills. 3.  Refilled patient's Dicyclomine 20 mg twice daily, #60 with 3 refills. 4.  Refill Phenergan 25 mg every 8 hours as needed for nausea #30 with 1 refill. 5.  Patient was given a work note for today. 6.  Patient to follow in clinic  per recommendations from Dr.  Rush Landmark after time of procedure.  Ellouise Newer, PA-C [facility] 10/14/2021, 3:22 PM  Cc: [facility]

## 2021-10-15 NOTE — Progress Notes (Signed)
Attending Physician's Attestation   I have reviewed the chart.   I agree with the Advanced Practitioner's note, impression, and recommendations with any updates as below.    Terrell Shimko Mansouraty, MD [facility] Advanced Endoscopy Office # [phone]  

## 2021-11-13 ENCOUNTER — Encounter: Payer: Self-pay | Admitting: Gastroenterology

## 2021-11-13 ENCOUNTER — Ambulatory Visit (AMBULATORY_SURGERY_CENTER): Payer: Medicaid Other | Admitting: Gastroenterology

## 2021-11-13 VITALS — BP 98/55 | HR 62 | Temp 98.1°F | Resp 9 | Ht 65.0 in | Wt 194.0 lb

## 2021-11-13 DIAGNOSIS — K319 Disease of stomach and duodenum, unspecified: Secondary | ICD-10-CM | POA: Diagnosis not present

## 2021-11-13 DIAGNOSIS — K449 Diaphragmatic hernia without obstruction or gangrene: Secondary | ICD-10-CM | POA: Diagnosis not present

## 2021-11-13 DIAGNOSIS — R12 Heartburn: Secondary | ICD-10-CM

## 2021-11-13 DIAGNOSIS — R1013 Epigastric pain: Secondary | ICD-10-CM

## 2021-11-13 DIAGNOSIS — K219 Gastro-esophageal reflux disease without esophagitis: Secondary | ICD-10-CM

## 2021-11-13 MED ORDER — SODIUM CHLORIDE 0.9 % IV SOLN: 500.0000 mL | Freq: Once | INTRAVENOUS | Status: DC

## 2021-11-13 MED ORDER — OMEPRAZOLE 40 MG PO CPDR: 40.0000 mg | DELAYED_RELEASE_CAPSULE | Freq: Every day | ORAL | 6 refills | Status: DC

## 2021-11-13 NOTE — Progress Notes (Signed)
VS-CW  Pt's states no medical or surgical changes since previsit or office visit.  

## 2021-11-13 NOTE — Progress Notes (Signed)
Report to PACU, RN, vss, BBS= Clear.  

## 2021-11-13 NOTE — Progress Notes (Signed)
? ?GASTROENTEROLOGY PROCEDURE H&P NOTE  ? ?Primary Care Physician: ?[facility] ? ?HPI: ?Mary Hogan is a 41 y.o. female who presents for EGD to evaluate abdominal pain/bloating and history of PUD and GERD symptoms. ? ?Past Medical History:  ?Diagnosis Date  ? Abnormal Pap smear   ? Anxiety   ? Depression   ? Enlarged liver   ? Headache(784.0)   ? IBS (irritable bowel syndrome)   ? Scoliosis   ? ?Past Surgical History:  ?Procedure Laterality Date  ? BIOPSY  03/14/2019  ? Procedure: BIOPSY;  Surgeon: Irving Copas., MD;  Location: [facility] ENDOSCOPY;  Service: Gastroenterology;;  ? BREAST ENHANCEMENT SURGERY  2008  ? DILATION AND CURETTAGE OF UTERUS    ? ENDOMETRIAL ABLATION    ? ESOPHAGOGASTRODUODENOSCOPY (EGD) WITH PROPOFOL N/A 03/14/2019  ? Procedure: ESOPHAGOGASTRODUODENOSCOPY (EGD) WITH PROPOFOL;  Surgeon: Rush Landmark Telford Nab., MD;  Location: [place];  Service: Gastroenterology;  Laterality: N/A;  ? EUS N/A 03/14/2019  ? Procedure: UPPER ENDOSCOPIC ULTRASOUND (EUS) RADIAL;  Surgeon: Rush Landmark Telford Nab., MD;  Location: [place];  Service: Gastroenterology;  Laterality: N/A;  ? FINE NEEDLE ASPIRATION  03/14/2019  ? Procedure: FINE NEEDLE ASPIRATION (FNA) LINEAR;  Surgeon: Rush Landmark Telford Nab., MD;  Location: [facility] ENDOSCOPY;  Service: Gastroenterology;;  ? [place]  2012  ? TUBAL LIGATION    ? ?Current Outpatient Medications  ?Medication Sig Dispense Refill  ? clonazePAM (KLONOPIN) 1 MG tablet Take 1 mg by mouth 3 (three) times daily as needed.    ? cloNIDine (CATAPRES) 0.2 MG tablet Take 0.4 mg by mouth at bedtime.    ? dicyclomine (BENTYL) 20 MG tablet Take 1 tablet (20 mg total) by mouth 2 (two) times daily as needed for spasms. 60 tablet 11  ? promethazine (PHENERGAN) 25 MG tablet Take 1 tablet (25 mg total) by mouth every 8 (eight) hours as needed for nausea or vomiting. 30 tablet 2  ? ?Current Facility-Administered Medications  ?Medication  Dose Route Frequency Provider Last Rate Last Admin  ? 0.9 %  sodium chloride infusion  500 mL Intravenous Once Mansouraty, Telford Nab., MD      ? ? ?Current Outpatient Medications:  ?  clonazePAM (KLONOPIN) 1 MG tablet, Take 1 mg by mouth 3 (three) times daily as needed., Disp: , Rfl:  ?  cloNIDine (CATAPRES) 0.2 MG tablet, Take 0.4 mg by mouth at bedtime., Disp: , Rfl:  ?  dicyclomine (BENTYL) 20 MG tablet, Take 1 tablet (20 mg total) by mouth 2 (two) times daily as needed for spasms., Disp: 60 tablet, Rfl: 11 ?  promethazine (PHENERGAN) 25 MG tablet, Take 1 tablet (25 mg total) by mouth every 8 (eight) hours as needed for nausea or vomiting., Disp: 30 tablet, Rfl: 2 ? ?Current Facility-Administered Medications:  ?  0.9 %  sodium chloride infusion, 500 mL, Intravenous, Once, Mansouraty, Telford Nab., MD ?Allergies  ?Allergen Reactions  ? Ketorolac Tromethamine Other (See Comments)  ?  Stomach burning in stomach.  ? Amoxicillin Hives  ?  Has patient had a PCN reaction causing immediate rash, facial/tongue/throat swelling, SOB or lightheadedness with hypotension:  ?Has patient had a PCN reaction causing severe rash involving mucus membranes or skin necrosis:  ?Has patient had a PCN reaction that required hospitalization  ?Has patient had a PCN reaction occurring within the last 10 years:  ?If all of the above answers are "NO", then may proceed with Cephalosporin use. ?  ? Penicillins Hives  ?  Tramadol Hives  ? ?Family History  ?Problem Relation Age of Onset  ? Alzheimer's disease Mother   ? Hypertension Mother   ? Diabetes Mother   ? Anxiety disorder Mother   ? Depression Mother   ? Post-traumatic stress disorder Mother   ? Irritable bowel syndrome Mother   ? Parkinson's disease Mother   ? Diabetes Father   ? Liver disease Maternal Grandfather   ? Pancreatic cancer Maternal Uncle   ? Stomach cancer Maternal Uncle   ? Diabetes Maternal Uncle   ? Colon cancer Maternal Uncle   ? Esophageal cancer Maternal Uncle   ?  Anesthesia problems Neg Hx   ? Hypotension Neg Hx   ? Malignant hyperthermia Neg Hx   ? Pseudochol deficiency Neg Hx   ? ?Social History  ? ?Socioeconomic History  ? Marital status: Single  ?  Spouse name: Not on file  ? Number of children: 4  ? Years of education: Not on file  ? Highest education level: Not on file  ?Occupational History  ? Occupation: server  ?Tobacco Use  ? Smoking status: Every Day  ?  Packs/day: 1.00  ?  Types: Cigarettes  ? Smokeless tobacco: Never  ?Vaping Use  ? Vaping Use: Never used  ?Substance and Sexual Activity  ? Alcohol use: Yes  ?  Comment: occasional  ? Drug use: Never  ? Sexual activity: Yes  ?  Birth control/protection: Surgical  ?Other Topics Concern  ? Not on file  ?Social History Narrative  ? Not on file  ? ?Social Determinants of Health  ? ?Financial Resource Strain: Not on file  ?Food Insecurity: Not on file  ?Transportation Needs: Not on file  ?Physical Activity: Not on file  ?Stress: Not on file  ?Social Connections: Not on file  ?Intimate Partner Violence: Not on file  ? ? ?Physical Exam: ?Today's Vitals  ? 11/13/21 7893 11/13/21 8101 11/13/21 0946  ?BP: 98/66  111/73  ?Pulse: 74  80  ?Resp:   12  ?Temp: 98.1 ?F (36.7 ?C) 98.1 ?F (36.7 ?C)   ?SpO2: 97%  98%  ?Weight: 194 lb (88 kg)    ?Height: '5\' 5"'$  (1.651 m)    ? ?Body mass index is 32.28 kg/m?. ?GEN: NAD ?EYE: Sclerae anicteric ?ENT: MMM ?CV: Non-tachycardic ?GI: Soft, NT/ND ?NEURO:  Alert & Oriented x 3 ? ?Lab Results: ?No results for input(s): WBC, HGB, HCT, PLT in the last 72 hours. ?BMET ?No results for input(s): NA, K, CL, CO2, GLUCOSE, BUN, CREATININE, CALCIUM in the last 72 hours. ?LFT ?No results for input(s): PROT, ALBUMIN, AST, ALT, ALKPHOS, BILITOT, BILIDIR, IBILI in the last 72 hours. ?PT/INR ?No results for input(s): LABPROT, INR in the last 72 hours. ? ? ?Impression / Plan: ?This is a 41 y.o.female who presents for EGD to evaluate abdominal pain/bloating and history of PUD and GERD symptoms. ? ?The risks  and benefits of endoscopic evaluation/treatment were discussed with the patient and/or family; these include but are not limited to the risk of perforation, infection, bleeding, missed lesions, lack of diagnosis, severe illness requiring hospitalization, as well as anesthesia and sedation related illnesses.  The patient's history has been reviewed, patient examined, no change in status, and deemed stable for procedure.  The patient and/or family is agreeable to proceed.  ? ? ?Justice Britain, MD ?[facility] ?Advanced Endoscopy ?Office # [phone] ? ?

## 2021-11-13 NOTE — Op Note (Signed)
[place] ?Patient Name: Mary Hogan ?Procedure Date: 11/13/2021 9:42 AM ?MRN: [id number] ?Endoscopist: Justice Britain , MD ?Age: 41 ?Referring MD:  ?Date of Birth: 1981/04/05 ?Gender: Female ?Account #: [id number] ?Procedure:                Upper GI endoscopy ?Indications:              Upper abdominal pain, Dyspepsia, Indigestion,  ?                          Heartburn ?Medicines:                Monitored Anesthesia Care ?Procedure:                Pre-Anesthesia Assessment: ?                          - Prior to the procedure, a History and Physical  ?                          was performed, and patient medications and  ?                          allergies were reviewed. The patient's tolerance of  ?                          previous anesthesia was also reviewed. The risks  ?                          and benefits of the procedure and the sedation  ?                          options and risks were discussed with the patient.  ?                          All questions were answered, and informed consent  ?                          was obtained. Prior Anticoagulants: The patient has  ?                          taken no previous anticoagulant or antiplatelet  ?                          agents. ASA Grade Assessment: II - A patient with  ?                          mild systemic disease. After reviewing the risks  ?                          and benefits, the patient was deemed in  ?                          satisfactory condition to undergo the procedure. ?  After obtaining informed consent, the endoscope was  ?                          passed under direct vision. Throughout the  ?                          procedure, the patient's blood pressure, pulse, and  ?                          oxygen saturations were monitored continuously. The  ?                          GIF [id number] #[id number] was introduced through the  ?                          mouth, and advanced to the second part of  duodenum.  ?                          The upper GI endoscopy was accomplished without  ?                          difficulty. The patient tolerated the procedure. ?Scope In: ?Scope Out: ?Findings:                 No gross lesions were noted in the entire esophagus. ?                          The Z-line was irregular and was found 36 cm from  ?                          the incisors. ?                          A 1 cm hiatal hernia was present. ?                          Striped mildly erythematous mucosa without bleeding  ?                          was found in the gastric antrum. ?                          No other gross lesions were noted in the entire  ?                          examined stomach. Biopsies were taken with a cold  ?                          forceps for histology and Helicobacter pylori  ?                          testing. ?                          No gross lesions were noted  in the duodenal bulb,  ?                          in the first portion of the duodenum and in the  ?                          second portion of the duodenum. ?Complications:            No immediate complications. ?Estimated Blood Loss:     Estimated blood loss was minimal. ?Impression:               - No gross lesions in esophagus. Z-line irregular,  ?                          36 cm from the incisors. ?                          - 1 cm hiatal hernia. ?                          - Erythematous mucosa in the antrum. No other gross  ?                          lesions in the stomach. Biopsied. ?                          - No gross lesions in the duodenal bulb, in the  ?                          first portion of the duodenum and in the second  ?                          portion of the duodenum. ?Recommendation:           - The patient will be observed post-procedure,  ?                          until all discharge criteria are met. ?                          - Discharge patient to home. ?                          - Patient has a  contact number available for  ?                          emergencies. The signs and symptoms of potential  ?                          delayed complications were discussed with the  ?                          patient. Return to normal activities tomorrow.  ?  Written discharge instructions were provided to the  ?                          patient. ?                          - Resume previous diet. ?                          - Start Omeprazole 40 mg daily. ?                          - Continue present medications. ?                          - Await pathology results. ?                          - Recommend EPI evaluation with fecal elastase  ?                          testing. ?                          - Strongly consider SIBO breath testing v empiric  ?                          treatment. ?                          - Consideration of TCA use nightly will be  ?                          considered as well. ?                          - The findings and recommendations were discussed  ?                          with the patient. ?Justice Britain, MD ?11/13/2021 10:07:52 AM ?

## 2021-11-13 NOTE — Patient Instructions (Addendum)
Thank you for coming in to see [place] today. ?Resume previous diet and other medications today. ?Start Omeprazole 40 mg daily. Prescriptions sent to your pharmacy. ?Other recommendations were discussed with you and Dr Rush Landmark ? ? ? ? ?YOU HAD AN ENDOSCOPIC PROCEDURE TODAY AT [facility]:   Refer to the procedure report that was given to you for any specific questions about what was found during the examination.  If the procedure report does not answer your questions, please call your gastroenterologist to clarify.  If you requested that your care partner not be given the details of your procedure findings, then the procedure report has been included in a sealed envelope for you to review at your convenience later. ? ?YOU SHOULD EXPECT: Some feelings of bloating in the abdomen. Passage of more gas than usual.  Walking can help get rid of the air that was put into your GI tract during the procedure and reduce the bloating. If you had a lower endoscopy (such as a colonoscopy or flexible sigmoidoscopy) you may notice spotting of blood in your stool or on the toilet paper. If you underwent a bowel prep for your procedure, you may not have a normal bowel movement for a few days. ? ?Please Note:  You might notice some irritation and congestion in your nose or some drainage.  This is from the oxygen used during your procedure.  There is no need for concern and it should clear up in a day or so. ? ?SYMPTOMS TO REPORT IMMEDIATELY: ? ? ?Following upper endoscopy (EGD) ? Vomiting of blood or coffee ground material ? New chest pain or pain under the shoulder blades ? Painful or persistently difficult swallowing ? New shortness of breath ? Fever of 100?F or higher ? Black, tarry-looking stools ? ?For urgent or emergent issues, a gastroenterologist can be reached at any hour by calling [phone]. ?Do not use MyChart messaging for urgent concerns.  ? ? ?DIET:  We do recommend a small meal at first, but then you  may proceed to your regular diet.  Drink plenty of fluids but you should avoid alcoholic beverages for 24 hours. ? ?ACTIVITY:  You should plan to take it easy for the rest of today and you should NOT DRIVE or use heavy machinery until tomorrow (because of the sedation medicines used during the test).   ? ?FOLLOW UP: ?Our staff will call the number listed on your records 48-72 hours following your procedure to check on you and address any questions or concerns that you may have regarding the information given to you following your procedure. If we do not reach you, we will leave a message.  We will attempt to reach you two times.  During this call, we will ask if you have developed any symptoms of COVID 19. If you develop any symptoms (ie: fever, flu-like symptoms, shortness of breath, cough etc.) before then, please call [phone].  If you test positive for Covid 19 in the 2 weeks post procedure, please call and report this information to [place].   ? ?If any biopsies were taken you will be contacted by phone or by letter within the next 1-3 weeks.  Please call us at [phone] if you have not heard about the biopsies in 3 weeks.  ? ? ?SIGNATURES/CONFIDENTIALITY: ?You and/or your care partner have signed paperwork which will be entered into your electronic medical record.  These signatures attest to the fact that that the information above on your  After Visit Summary has been reviewed and is understood.  Full responsibility of the confidentiality of this discharge information lies with you and/or your care-partner.  ?

## 2021-11-13 NOTE — Progress Notes (Signed)
Called to room to assist during endoscopic procedure.  Patient ID and intended procedure confirmed with present staff. Received instructions for my participation in the procedure from the performing physician.  

## 2021-11-15 ENCOUNTER — Encounter: Payer: Self-pay | Admitting: Gastroenterology

## 2021-11-15 ENCOUNTER — Telehealth: Payer: Self-pay

## 2021-11-15 NOTE — Telephone Encounter (Signed)
?  Follow up Call- ? ?Call back number 11/13/2021  ?Post procedure Call Back phone  # [phone]  ?Permission to leave phone message Yes  ?Some recent data might be hidden  ?  ? ?Patient questions: ? ?Do you have a fever, pain , or abdominal swelling? Yes.   ?Pain Score  2 * ? ?Have you tolerated food without any problems? Yes.   ? ?Have you been able to return to your normal activities? Yes.   ? ?Do you have any questions about your discharge instructions: ?Diet   No. ?Medications  No. ?Follow up visit  No. ? ?Do you have questions or concerns about your Care? Yes.   ? ?Actions: ?* If pain score is 4 or above: ?No action needed, pain <4. ? ?Patient is still experiencing some throat pain but is able to eat and drink without difficulty.  No fever or bleeding.  Advised patient to gargle with warm salt water and see if that helps.  Patient agreeable and will call if things worsen.   ?

## 2021-11-20 ENCOUNTER — Other Ambulatory Visit: Payer: Self-pay

## 2021-11-20 DIAGNOSIS — R112 Nausea with vomiting, unspecified: Secondary | ICD-10-CM

## 2021-11-20 DIAGNOSIS — K8689 Other specified diseases of pancreas: Secondary | ICD-10-CM

## 2021-11-20 DIAGNOSIS — R6881 Early satiety: Secondary | ICD-10-CM

## 2022-01-08 ENCOUNTER — Ambulatory Visit: Payer: Medicaid Other | Admitting: Gastroenterology

## 2022-06-30 ENCOUNTER — Other Ambulatory Visit: Payer: Self-pay | Admitting: Gastroenterology

## 2022-08-08 ENCOUNTER — Encounter (HOSPITAL_COMMUNITY): Payer: Self-pay

## 2022-08-08 ENCOUNTER — Ambulatory Visit (HOSPITAL_COMMUNITY)
Admission: EM | Admit: 2022-08-08 | Discharge: 2022-08-08 | Disposition: A | Discharge status: Home / Self Care | Payer: Medicaid Other | Attending: Physician Assistant | Admitting: Physician Assistant

## 2022-08-08 DIAGNOSIS — R8290 Unspecified abnormal findings in urine: Secondary | ICD-10-CM | POA: Diagnosis not present

## 2022-08-08 DIAGNOSIS — R82998 Other abnormal findings in urine: Secondary | ICD-10-CM | POA: Diagnosis not present

## 2022-08-08 DIAGNOSIS — K047 Periapical abscess without sinus: Secondary | ICD-10-CM

## 2022-08-08 DIAGNOSIS — K120 Recurrent oral aphthae: Secondary | ICD-10-CM | POA: Diagnosis not present

## 2022-08-08 LAB — POCT URINALYSIS DIPSTICK, ED / UC
Glucose, UA: NEGATIVE mg/dL
Leukocytes,Ua: NEGATIVE
Nitrite: NEGATIVE
Protein, ur: 30 mg/dL — AB
Specific Gravity, Urine: >=1.03 (ref 1.005–1.030)
Urobilinogen, UA: 0.2 mg/dL (ref 0.0–1.0)
pH: 5.5 (ref 5.0–8.0)

## 2022-08-08 MED ORDER — NYSTATIN 100000 UNIT/ML MT SUSP: 5.0000 mL | Freq: Four times a day (QID) | OROMUCOSAL | 0 refills | Status: DC | PRN

## 2022-08-08 MED ORDER — CLINDAMYCIN HCL 300 MG PO CAPS: 300.0000 mg | ORAL_CAPSULE | Freq: Three times a day (TID) | ORAL | 0 refills | Status: DC

## 2022-08-08 NOTE — Discharge Instructions (Signed)
We are treating you for dental infection.  Please take clindamycin 3 times daily for 7 days.  This can upset your stomach so take it with food.  Your urine did not have any evidence of infection but does appear that you are not eating and drinking of.  Please make sure that you are pushing fluids.  Follow-up with your primary care first thing next week to have this rechecked.  I have called in Magic mouthwash that you can use up to 4 times a day.  Swish and spit this out.  You can use over-the-counter medications for additional symptom relief.  If your symptoms or not improving or if anything worsens you need to go to the emergency room immediately.  If you develop any shortness of breath, swelling of your throat, fever, rapid swelling of your jaw, nausea/vomiting, weakness you need to go to the ER.

## 2022-08-08 NOTE — ED Provider Notes (Signed)
[facility]    CSN: [id number] Arrival date & time: 08/08/22  0160      History   Chief Complaint Chief Complaint  Patient presents with   Facial Pain    I can't hardly talk and I have a knot on my right side of my face on my check bone and my lymph node's are swollen and it's hard for me to swallow. Having headache and ear ache with this. Started Tuesday and has just got worse. - Entered by patient    HPI Mary Hogan is a 41 y.o. female.   Patient presents today with a 2-day history of worsening pain and swelling of her right lower jaw.  She reports pain is rated 8 on a 0-10 pain scale, described as aching, no aggravating alleviating factors identified.  Denies any recent dental procedures; wears upper and lower dentures.  She has not tried any over-the-counter medication for symptom management.  Reports that soon after pain and swelling began she developed several ulcers on her tongue that have become painful.  She denies any unusual diet or foods.  Denies any recent medication changes.  Denies history of immunosuppression or diabetes.  She denies any dysphagia, fever, muffled voice, swelling of her throat, shortness of breath, nausea, vomiting, diarrhea.  She does report that she has had slightly darker urine over the past several days but denies any frequency, urgency, hematuria, dysuria.  She is confident that she is not pregnant she had a tubal ligation.      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   Methamphetamine abuse ([place]) 10/27/2017   UTI due to extended-spectrum beta lactamase (ESBL) producing Escherichia coli 06/09/2016   Bipolar affective disorder, currently manic, moderate ([place]) 06/05/2016   URI (upper respiratory infection) 12/23/2013   Attention deficit disorder 06/14/2013   Generalized anxiety disorder 06/14/2013   Essential (primary) hypertension 05/02/2013   SOB (shortness of breath) 05/02/2013   Chest pain 03/08/2012   MVP (mitral valve prolapse) 03/08/2012   Palpitations 03/08/2012   Hx of migraine headaches 02/19/2012   Polysubstance overdose 12/25/2011   Bipolar disorder current episode depressed ([place]) 12/06/2011    Class: Acute   IBS (irritable bowel syndrome) 10/13/2011   Epigastric pain 08/27/2011   Chronic pain 03/05/2011   Migraine 03/05/2011   TOBACCO USER 05/26/2009   AMENORRHEA, SECONDARY 01/26/2007   DEPRESSIVE DISORDER, NOS 11/05/2006   RESTLESS LEGS SYNDROME 11/05/2006   PAPANICOLAOU SMEAR, ABNORMAL 11/05/2006   PAPANICOLAOU SMEAR, ABNORMAL 11/05/2006    Past Surgical History:  Procedure Laterality Date   BIOPSY  03/14/2019   Procedure: BIOPSY;  Surgeon: Irving Copas., MD;  Location: [facility];  Service: Gastroenterology;;   BREAST ENHANCEMENT SURGERY  2008   DILATION AND CURETTAGE OF UTERUS     ENDOMETRIAL ABLATION     ESOPHAGOGASTRODUODENOSCOPY (EGD) WITH PROPOFOL N/A 03/14/2019   Procedure: ESOPHAGOGASTRODUODENOSCOPY (EGD) WITH PROPOFOL;  Surgeon: Irving Copas.,  MD;  Location: [facility];  Service: Gastroenterology;  Laterality: N/A;   EUS N/A 03/14/2019   Procedure: UPPER ENDOSCOPIC ULTRASOUND (EUS) RADIAL;  Surgeon: Irving Copas., MD;  Location: [facility];  Service: Gastroenterology;  Laterality: N/A;   FINE NEEDLE ASPIRATION  03/14/2019   Procedure: FINE NEEDLE ASPIRATION (FNA) LINEAR;  Surgeon: Irving Copas., MD;  Location: [facility];  Service: Gastroenterology;;    LAPAROSCOPIC OVARIAN CYSTECTOMY  2012   TUBAL LIGATION      OB History     Gravida  9   Para  4   Term  2   Preterm  2   AB  5   Living  4      SAB  2   IAB  2   Ectopic  1   Multiple      Live Births               Home Medications    Prior to Admission medications   Medication Sig Start Date End Date Taking? Authorizing Provider  clindamycin (CLEOCIN) 300 MG capsule Take 1 capsule (300 mg total) by mouth 3 (three) times daily. 08/08/22  Yes Einer Meals, Derry Skill, PA-C  magic mouthwash (nystatin, lidocaine, diphenhydrAMINE) suspension Take 5 mLs by mouth 4 (four) times daily as needed for mouth pain. Swish and spit 08/08/22  Yes Jakyria Bleau K, PA-C  clonazePAM (KLONOPIN) 1 MG tablet Take 1 mg by mouth 3 (three) times daily as needed. 10/11/21   [provider]  cloNIDine (CATAPRES) 0.2 MG tablet Take 0.4 mg by mouth at bedtime. 09/11/21   [provider]  dicyclomine (BENTYL) 20 MG tablet Take 1 tablet (20 mg total) by mouth 2 (two) times daily as needed for spasms. 10/14/21   Levin Erp, PA  omeprazole (PRILOSEC) 40 MG capsule TAKE 1 CAPSULE (40 MG TOTAL) BY MOUTH DAILY. 07/01/22   Mansouraty, Telford Nab., MD  promethazine (PHENERGAN) 25 MG tablet Take 1 tablet (25 mg total) by mouth every 8 (eight) hours as needed for nausea or vomiting. 10/14/21   Levin Erp, PA  modafinil (PROVIGIL) 100 MG tablet Take 50-100 mg by mouth See admin instructions. Take 1/2 to 1 tablet every morning and at noon 05/13/18 08/10/19  [provider]    Family History Family History  Problem Relation Age of Onset   Alzheimer's disease Mother    Hypertension Mother    Diabetes Mother    Anxiety disorder Mother    Depression Mother    Post-traumatic stress disorder Mother    Irritable bowel syndrome Mother    Parkinson's disease Mother    Diabetes Father    Liver disease Maternal Grandfather    Pancreatic cancer Maternal Uncle    Stomach cancer  Maternal Uncle    Diabetes Maternal Uncle    Colon cancer Maternal Uncle    Esophageal cancer Maternal Uncle    Anesthesia problems Neg Hx    Hypotension Neg Hx    Malignant hyperthermia Neg Hx    Pseudochol deficiency Neg Hx     Social History Social History   Tobacco Use   Smoking status: Every Day    Packs/day: 1.00    Types: Cigarettes   Smokeless tobacco: Never  Vaping Use   Vaping Use: Never used  Substance Use Topics   Alcohol use: Yes    Comment: occasional   Drug use: Never     Allergies   Ketorolac tromethamine, Amoxicillin, Penicillins, and Tramadol  Review of Systems Review of Systems  Constitutional:  Negative for activity change, appetite change, fatigue and fever.  HENT:  Positive for dental problem, facial swelling and mouth sores. Negative for congestion, sinus pressure, sneezing, sore throat, trouble swallowing and voice change.   Respiratory:  Negative for cough and shortness of breath.   Cardiovascular:  Negative for chest pain.  Gastrointestinal:  Negative for abdominal pain, diarrhea, nausea and vomiting.  Neurological:  Negative for dizziness, light-headedness and headaches.     Physical Exam Triage Vital Signs ED Triage Vitals  Enc Vitals Group     BP 08/08/22 1004 111/76     Pulse Rate 08/08/22 1004 90     Resp 08/08/22 1004 16     Temp 08/08/22 1004 98.2 F (36.8 C)     Temp Source 08/08/22 1004 Oral     SpO2 08/08/22 1004 96 %     Weight --      Height --      Head Circumference --      Peak Flow --      Pain Score 08/08/22 1005 8     Pain Loc --      Pain Edu? --      Excl. in [place]? --    No data found.  Updated Vital Signs BP 111/76 (BP Location: Left Arm)   Pulse 90   Temp 98.2 F (36.8 C) (Oral)   Resp 16   LMP  (LMP Unknown)   SpO2 96%   Visual Acuity Right Eye Distance:   Left Eye Distance:   Bilateral Distance:    Right Eye Near:   Left Eye Near:    Bilateral Near:     Physical Exam Vitals reviewed.   Constitutional:      General: She is awake. She is not in acute distress.    Appearance: Normal appearance. She is well-developed. She is not ill-appearing.     Comments: Very pleasant female appears stated age in no acute distress sitting comfortably in exam room  HENT:     Head: Normocephalic and atraumatic.     Jaw: Swelling present.      Nose: Nose normal.     Right Sinus: No maxillary sinus tenderness or frontal sinus tenderness.     Left Sinus: No maxillary sinus tenderness or frontal sinus tenderness.     Mouth/Throat:     Dentition: Has dentures. Gingival swelling present.     Tongue: Lesions present.     Pharynx: Uvula midline. No oropharyngeal exudate or posterior oropharyngeal erythema.     Comments: 3 ulcers noted right lateral tongue. Cardiovascular:     Rate and Rhythm: Normal rate and regular rhythm.     Heart sounds: Normal heart sounds, S1 normal and S2 normal. No murmur heard. Pulmonary:     Effort: Pulmonary effort is normal.     Breath sounds: Normal breath sounds. No wheezing, rhonchi or rales.     Comments: Clear to auscultation bilaterally Lymphadenopathy:     Head:     Right side of head: No submental, submandibular or tonsillar adenopathy.     Left side of head: No submental, submandibular or tonsillar adenopathy.     Cervical: No cervical adenopathy.  Psychiatric:        Behavior: Behavior is cooperative.      UC Treatments / Results  Labs (all labs ordered are listed, but only abnormal results are displayed) Labs Reviewed  POCT URINALYSIS DIPSTICK, ED / UC - Abnormal; Notable  for the following components:      Result Value   Bilirubin Urine SMALL (*)    Ketones, ur TRACE (*)    Hgb urine dipstick TRACE (*)    Protein, ur 30 (*)    All other components within normal limits    EKG   Radiology No results found.  Procedures Procedures (including critical care time)  Medications Ordered in UC Medications - No data to display  Initial  Impression / Assessment and Plan / UC Course  I have reviewed the triage vital signs and the nursing notes.  Pertinent labs & imaging results that were available during my care of the patient were reviewed by me and considered in my medical decision making (see chart for details).     Patient is well-appearing, afebrile, nontoxic, nontachycardic, with oxygen saturation of 96%.  Concern for dental infection.  Given history of allergy to amoxicillin/penicillin we will give clindamycin 3 times daily for 7 days.  We discussed that the pain on her tongue is likely related to the noted ulcers.  She was started on Magic mouthwash up to 4 times a day with instruction to swish and spit this for symptomatic management.  She can alternate Tylenol ibuprofen for pain.  Discussed that if her symptoms or not improving quickly she should follow-up with our clinic or her primary care by Monday of next week.  If at any point she has any worsening symptoms including shortness of breath, swelling of her throat, muffled voice, nausea/vomiting, weakness, increased swelling of her jaw she is to go to the emergency room immediately.  Strict return precautions given.  Work excuse note provided.  During visit patient did report dark urine.  Her urine was consistent with concentration related to decreased oral intake.  No evidence of infection.  She was encouraged to push fluids recommended that she follow-up with her primary care first thing next week to have this rechecked.  If she develops any additional or changing symptoms she is to return for reevaluation.  Final Clinical Impressions(s) / UC Diagnoses   Final diagnoses:  Dental infection  Aphthous ulcer of tongue  Dark urine  Abnormal urinalysis     Discharge Instructions      We are treating you for dental infection.  Please take clindamycin 3 times daily for 7 days.  This can upset your stomach so take it with food.  Your urine did not have any evidence of  infection but does appear that you are not eating and drinking of.  Please make sure that you are pushing fluids.  Follow-up with your primary care first thing next week to have this rechecked.  I have called in Magic mouthwash that you can use up to 4 times a day.  Swish and spit this out.  You can use over-the-counter medications for additional symptom relief.  If your symptoms or not improving or if anything worsens you need to go to the emergency room immediately.  If you develop any shortness of breath, swelling of your throat, fever, rapid swelling of your jaw, nausea/vomiting, weakness you need to go to the ER.     ED Prescriptions     Medication Sig Dispense Auth. Provider   clindamycin (CLEOCIN) 300 MG capsule Take 1 capsule (300 mg total) by mouth 3 (three) times daily. 21 capsule Idan Prime K, PA-C   magic mouthwash (nystatin, lidocaine, diphenhydrAMINE) suspension Take 5 mLs by mouth 4 (four) times daily as needed for mouth pain. Swish and  spit 180 mL Johnisha Louks K, PA-C      PDMP not reviewed this encounter.   Terrilee Croak, PA-C 08/08/22 1043

## 2022-08-08 NOTE — ED Triage Notes (Signed)
Pt states right sided facial pain with sores in her mouth and tongue for the past 2 days.

## 2022-10-04 ENCOUNTER — Emergency Department (HOSPITAL_BASED_OUTPATIENT_CLINIC_OR_DEPARTMENT_OTHER): Payer: Medicaid Other

## 2022-10-04 ENCOUNTER — Emergency Department (HOSPITAL_BASED_OUTPATIENT_CLINIC_OR_DEPARTMENT_OTHER)
Admission: EM | Admit: 2022-10-04 | Discharge: 2022-10-04 | Disposition: A | Discharge status: Home / Self Care | Payer: Medicaid Other | Attending: Emergency Medicine | Admitting: Emergency Medicine

## 2022-10-04 ENCOUNTER — Other Ambulatory Visit: Payer: Self-pay

## 2022-10-04 ENCOUNTER — Encounter (HOSPITAL_BASED_OUTPATIENT_CLINIC_OR_DEPARTMENT_OTHER): Payer: Self-pay | Admitting: Emergency Medicine

## 2022-10-04 DIAGNOSIS — R1084 Generalized abdominal pain: Secondary | ICD-10-CM | POA: Diagnosis present

## 2022-10-04 LAB — LIPASE, BLOOD: Lipase: <10 U/L — ABNORMAL LOW (ref 11–51)

## 2022-10-04 LAB — CBC
HCT: 38.9 % (ref 36.0–46.0)
Hemoglobin: 12.7 g/dL (ref 12.0–15.0)
MCH: 30.6 pg (ref 26.0–34.0)
MCHC: 32.6 g/dL (ref 30.0–36.0)
MCV: 93.7 fL (ref 80.0–100.0)
Platelets: 245 10*3/uL (ref 150–400)
RBC: 4.15 MIL/uL (ref 3.87–5.11)
RDW: 12.9 % (ref 11.5–15.5)
WBC: 9.4 10*3/uL (ref 4.0–10.5)
nRBC: 0 % (ref 0.0–0.2)

## 2022-10-04 LAB — COMPREHENSIVE METABOLIC PANEL
ALT: 8 U/L (ref 0–44)
AST: 11 U/L — ABNORMAL LOW (ref 15–41)
Albumin: 4.3 g/dL (ref 3.5–5.0)
Alkaline Phosphatase: 66 U/L (ref 38–126)
Anion gap: 7 (ref 5–15)
BUN: 12 mg/dL (ref 6–20)
CO2: 30 mmol/L (ref 22–32)
Calcium: 9.4 mg/dL (ref 8.9–10.3)
Chloride: 101 mmol/L (ref 98–111)
Creatinine, Ser: 0.89 mg/dL (ref 0.44–1.00)
GFR, Estimated: >60 mL/min (ref >60)
Glucose, Bld: 77 mg/dL (ref 70–99)
Potassium: 3.8 mmol/L (ref 3.5–5.1)
Sodium: 138 mmol/L (ref 135–145)
Total Bilirubin: 0.3 mg/dL (ref 0.3–1.2)
Total Protein: 7.3 g/dL (ref 6.5–8.1)

## 2022-10-04 LAB — URINALYSIS, ROUTINE W REFLEX MICROSCOPIC
Bilirubin Urine: NEGATIVE
Glucose, UA: NEGATIVE mg/dL
Hgb urine dipstick: NEGATIVE
Ketones, ur: NEGATIVE mg/dL
Nitrite: NEGATIVE
Protein, ur: NEGATIVE mg/dL
Specific Gravity, Urine: 1.022 (ref 1.005–1.030)
pH: 5.5 (ref 5.0–8.0)

## 2022-10-04 LAB — PREGNANCY, URINE: Preg Test, Ur: NEGATIVE

## 2022-10-04 MED ORDER — SODIUM CHLORIDE 0.9 % IV BOLUS
1000.0000 mL | Freq: Once | INTRAVENOUS | Status: AC
  Administered 2022-10-04: 1000 mL via INTRAVENOUS

## 2022-10-04 MED ORDER — MORPHINE SULFATE (PF) 4 MG/ML IV SOLN
4.0000 mg | Freq: Once | INTRAVENOUS | Status: AC
  Administered 2022-10-04: 4 mg via INTRAVENOUS
  Filled 2022-10-04: qty 1

## 2022-10-04 MED ORDER — IOHEXOL 300 MG/ML  SOLN
100.0000 mL | Freq: Once | INTRAMUSCULAR | Status: AC | PRN
  Administered 2022-10-04: 100 mL via INTRAVENOUS

## 2022-10-04 MED ORDER — HYDROCODONE-ACETAMINOPHEN 5-325 MG PO TABS: 1.0000 | ORAL_TABLET | Freq: Four times a day (QID) | ORAL | 0 refills | Status: DC | PRN

## 2022-10-04 MED ORDER — ONDANSETRON HCL 4 MG/2ML IJ SOLN
4.0000 mg | Freq: Once | INTRAMUSCULAR | Status: AC
  Administered 2022-10-04: 4 mg via INTRAVENOUS
  Filled 2022-10-04: qty 2

## 2022-10-04 NOTE — ED Provider Notes (Signed)
[facility] Provider Note   CSN: [id number] Arrival date & time: 10/04/22  0042     History  Chief Complaint  Patient presents with   Abdominal Pain    Mary Hogan is a 42 y.o. female.  Patient is a 42 year old female with past medical history of bipolar disorder, ADHD, anxiety, migraines, irritable bowel.  Patient presenting today with complaints of abdominal discomfort and loose stools.  This has been ongoing for the past 3 days.  She describes foul-smelling stools and episodes of near incontinence.  She denies any fevers or chills.  She denies any ill contacts.  The history is provided by the patient.       Home Medications Prior to Admission medications   Medication Sig Start Date End Date Taking? Authorizing Provider  clindamycin (CLEOCIN) 300 MG capsule Take 1 capsule (300 mg total) by mouth 3 (three) times daily. 08/08/22   Raspet, Derry Skill, PA-C  clonazePAM (KLONOPIN) 1 MG tablet Take 1 mg by mouth 3 (three) times daily as needed. 10/11/21   [provider]  cloNIDine (CATAPRES) 0.2 MG tablet Take 0.4 mg by mouth at bedtime. 09/11/21   [provider]  dicyclomine (BENTYL) 20 MG tablet Take 1 tablet (20 mg total) by mouth 2 (two) times daily as needed for spasms. 10/14/21   Levin Erp, PA  magic mouthwash (nystatin, lidocaine, diphenhydrAMINE) suspension Take 5 mLs by mouth 4 (four) times daily as needed for mouth pain. Swish and spit 08/08/22   Raspet, Erin K, PA-C  omeprazole (PRILOSEC) 40 MG capsule TAKE 1 CAPSULE (40 MG TOTAL) BY MOUTH DAILY. 07/01/22   Mansouraty, Telford Nab., MD  promethazine (PHENERGAN) 25 MG tablet Take 1 tablet (25 mg total) by mouth every 8 (eight) hours as needed for nausea or vomiting. 10/14/21   Levin Erp, PA  modafinil (PROVIGIL) 100 MG tablet Take 50-100 mg by mouth See admin instructions. Take 1/2 to 1 tablet every morning and at noon 05/13/18 08/10/19  [provider]      Allergies    Ketorolac tromethamine, Amoxicillin, Penicillins, and Tramadol    Review of Systems   Review of Systems  All other systems reviewed and are negative.   Physical Exam Updated Vital Signs BP 111/81   Pulse 78   Temp 98.4 F (36.9 C)   Resp 16   SpO2 98%  Physical Exam Vitals and nursing note reviewed.  Constitutional:      General: She is not in acute distress.    Appearance: She is well-developed. She is not diaphoretic.  HENT:     Head: Normocephalic and atraumatic.  Cardiovascular:     Rate and Rhythm: Normal rate and regular rhythm.     Heart sounds: No murmur heard.    No friction rub. No gallop.  Pulmonary:     Effort: Pulmonary effort is normal. No respiratory distress.     Breath sounds: Normal breath sounds. No wheezing.  Abdominal:     General: Bowel sounds are normal. There is no distension.     Palpations: Abdomen is soft.     Tenderness: There is generalized abdominal tenderness. There is no right CVA tenderness, left CVA tenderness, guarding or rebound.  Musculoskeletal:        General: Normal range of motion.     Cervical back: Normal range of motion and neck supple.  Skin:    General: Skin is warm and dry.  Neurological:  General: No focal deficit present.     Mental Status: She is alert and oriented to person, place, and time.     ED Results / Procedures / Treatments   Labs (all labs ordered are listed, but only abnormal results are displayed) Labs Reviewed  LIPASE, BLOOD - Abnormal; Notable for the following components:      Result Value   Lipase <10 (*)    All other components within normal limits  COMPREHENSIVE METABOLIC PANEL - Abnormal; Notable for the following components:   AST 11 (*)    All other components within normal limits  URINALYSIS, ROUTINE W REFLEX MICROSCOPIC - Abnormal; Notable for the following components:   APPearance HAZY (*)    Leukocytes,Ua TRACE (*)    Bacteria, UA MANY (*)     All other components within normal limits  CBC  PREGNANCY, URINE    EKG None  Radiology No results found.  Procedures Procedures    Medications Ordered in ED Medications  sodium chloride 0.9 % bolus 1,000 mL (has no administration in time range)  ondansetron (ZOFRAN) injection 4 mg (has no administration in time range)  morphine (PF) 4 MG/ML injection 4 mg (has no administration in time range)    ED Course/ Medical Decision Making/ A&P  Patient presenting here with complaints of abdominal pain and loose stools as described in the HPI.  She arrives here with stable vital signs and physical examination revealing mild generalized abdominal tenderness, but no rebound or guarding.  Workup initiated including CBC, metabolic panel, lipase, and urinalysis.  She has no evidence for UTI, no leukocytosis, and electrolytes which are unremarkable.  CT scan of the abdomen and pelvis was obtained showing no acute intra-abdominal process.  Patient given IV fluids along with morphine and Zofran.  She is now feeling better.  Patient's workup is unremarkable and does not indicate a cause for her symptoms.  This may be viral or foodborne, however it sounds as though she has been having these issues intermittently for many years.  I have advised her to follow-up with her GI doctor if not improving.  I will prescribe a small quantity of pain medicine and have her follow-up with GI.  Final Clinical Impression(s) / ED Diagnoses Final diagnoses:  None    Rx / DC Orders ED Discharge Orders     None         Veryl Speak, MD 10/04/22 [phone]

## 2022-10-04 NOTE — ED Triage Notes (Signed)
Lower Abdominal pain with diarrhea Reports hx of gastro problem but this episode started 3 days ago

## 2022-10-04 NOTE — Discharge Instructions (Signed)
Begin taking hydrocodone as prescribed as needed for pain.  Follow-up with your gastroenterologist if your symptoms or not improving in the next few days.

## 2022-10-05 ENCOUNTER — Ambulatory Visit (HOSPITAL_COMMUNITY)
Admission: RE | Admit: 2022-10-05 | Discharge: 2022-10-05 | Disposition: A | Discharge status: Home / Self Care | Payer: Medicaid Other | Source: Ambulatory Visit | Attending: Emergency Medicine | Admitting: Emergency Medicine

## 2022-10-05 ENCOUNTER — Other Ambulatory Visit: Payer: Self-pay

## 2022-10-05 ENCOUNTER — Encounter (HOSPITAL_COMMUNITY): Payer: Self-pay

## 2022-10-05 VITALS — BP 112/72 | HR 73 | Temp 98.5°F | Resp 18

## 2022-10-05 DIAGNOSIS — R1084 Generalized abdominal pain: Secondary | ICD-10-CM

## 2022-10-05 DIAGNOSIS — N898 Other specified noninflammatory disorders of vagina: Secondary | ICD-10-CM | POA: Diagnosis present

## 2022-10-05 DIAGNOSIS — R195 Other fecal abnormalities: Secondary | ICD-10-CM | POA: Diagnosis present

## 2022-10-05 LAB — POCT URINALYSIS DIPSTICK, ED / UC
Bilirubin Urine: NEGATIVE
Glucose, UA: NEGATIVE mg/dL
Hgb urine dipstick: NEGATIVE
Leukocytes,Ua: NEGATIVE
Nitrite: NEGATIVE
Protein, ur: NEGATIVE mg/dL
Specific Gravity, Urine: >=1.03 (ref 1.005–1.030)
Urobilinogen, UA: 1 mg/dL (ref 0.0–1.0)
pH: 5.5 (ref 5.0–8.0)

## 2022-10-05 LAB — POC URINE PREG, ED: Preg Test, Ur: NEGATIVE

## 2022-10-05 MED ORDER — DIPHENOXYLATE-ATROPINE 2.5-0.025 MG PO TABS: 1.0000 | ORAL_TABLET | Freq: Four times a day (QID) | ORAL | 0 refills | Status: DC | PRN

## 2022-10-05 NOTE — ED Provider Notes (Addendum)
[place]    CSN: [id number] Arrival date & time: 10/05/22  1659      History   Chief Complaint Chief Complaint  Patient presents with   Abdominal Pain    I was seen at [facility] on Friday. I am having some issues going on with my stomach and mucus coming out in my bum. I would like to be seen bc I'm still having the same problems and it's been going on now for almost a week. - Entered by patient   Diarrhea    HPI [name] is a 42 y.o. female.  Patient presents complaining of abdominal pain and diarrhea that started 1 week ago.  Patient states that she was evaluated at the emergency department on 10/04/2022, she reports that they performed a CT scan and collected blood work, she reports that did not have any abnormal findings and she was discharged, she reports that they gave her pain medication which has not provided her relief of symptoms. She reports that she continues to have generalized abdominal pain.  She reports that she has had multiple episodes of loose stools and currently has a copious amount of mucus and foul odor.  She reports having abdominal distention.  She reports upon onset of symptoms she had some nausea, she reports that she took some Phenergan which has provided her relief of those symptoms.  She reports that she does has a history of irritable bowel syndrome, she reports that she does see [facility], she states that she has not seen them "in a while".   She reports that she is concerned because she has had some dark urine and vaginal spotting at times.  She reports that she feels as though that her vaginal discharge has had an abnormal odor.  She reports that she is taking Imodium and Bentyl with no relief of symptoms.   She denies any alcohol use. She denies intake of greasy, high fat content foods.   Exhaustive medical history is listed below.  Abdominal Pain Associated symptoms: diarrhea, fatigue, nausea (Upon onset of  symptoms), vaginal bleeding (Intermittent spotting) and vaginal discharge   Associated symptoms: no chest pain, no chills, no constipation, no cough, no dysuria, no fever, no hematuria, no shortness of breath and no vomiting   Diarrhea Associated symptoms: abdominal pain   Associated symptoms: no chills, no fever and no vomiting     Past Medical History:  Diagnosis Date   Abnormal Pap smear    Anxiety    Depression    Enlarged liver    Headache(784.0)    IBS (irritable bowel syndrome)    Scoliosis     Patient Active Problem List   Diagnosis Date Noted   Nausea without vomiting 12/26/2018   Abdominal pain 09/24/2018   Abnormal CT of the abdomen 09/24/2018   Other constipation 09/24/2018   Change in bowel habits 09/24/2018   Anxiety 07/07/2018   Encounter for blood transfusion 07/07/2018   Scoliosis 07/07/2018   Psychotic disorder ([place]) 01/20/2018   Methamphetamine abuse ([place]) 10/27/2017   UTI due to extended-spectrum beta lactamase (ESBL) producing Escherichia coli 06/09/2016   Bipolar affective disorder, currently manic, moderate ([place]) 06/05/2016   URI (upper respiratory infection) 12/23/2013   Attention deficit disorder 06/14/2013   Generalized anxiety disorder 06/14/2013   Essential (primary) hypertension 05/02/2013   SOB (shortness of breath) 05/02/2013   Chest pain 03/08/2012   MVP (mitral valve prolapse) 03/08/2012   Palpitations 03/08/2012   Hx of migraine headaches  02/19/2012   Polysubstance overdose 12/25/2011   Bipolar disorder current episode depressed ([place]) 12/06/2011    Class: Acute   IBS (irritable bowel syndrome) 10/13/2011   Epigastric pain 08/27/2011   Chronic pain 03/05/2011   Migraine 03/05/2011   TOBACCO USER 05/26/2009   AMENORRHEA, SECONDARY 01/26/2007   DEPRESSIVE DISORDER, NOS 11/05/2006   RESTLESS LEGS SYNDROME 11/05/2006   PAPANICOLAOU SMEAR, ABNORMAL 11/05/2006   PAPANICOLAOU SMEAR, ABNORMAL 11/05/2006    Past Surgical History:   Procedure Laterality Date   BIOPSY  03/14/2019   Procedure: BIOPSY;  Surgeon: Rush Landmark Telford Nab., MD;  Location: [facility];  Service: Gastroenterology;;   BREAST ENHANCEMENT SURGERY  2008   DILATION AND CURETTAGE OF UTERUS     ENDOMETRIAL ABLATION     ESOPHAGOGASTRODUODENOSCOPY (EGD) WITH PROPOFOL N/A 03/14/2019   Procedure: ESOPHAGOGASTRODUODENOSCOPY (EGD) WITH PROPOFOL;  Surgeon: Irving Copas., MD;  Location: [facility];  Service: Gastroenterology;  Laterality: N/A;   EUS N/A 03/14/2019   Procedure: UPPER ENDOSCOPIC ULTRASOUND (EUS) RADIAL;  Surgeon: Irving Copas., MD;  Location: [facility];  Service: Gastroenterology;  Laterality: N/A;   FINE NEEDLE ASPIRATION  03/14/2019   Procedure: FINE NEEDLE ASPIRATION (FNA) LINEAR;  Surgeon: Irving Copas., MD;  Location: [facility];  Service: Gastroenterology;;   LAPAROSCOPIC OVARIAN CYSTECTOMY  2012   TUBAL LIGATION      OB History     Gravida  9   Para  4   Term  2   Preterm  2   AB  5   Living  4      SAB  2   IAB  2   Ectopic  1   Multiple      Live Births               Home Medications    Prior to Admission medications   Medication Sig Start Date End Date Taking? Authorizing Provider  diphenoxylate-atropine (LOMOTIL) 2.5-0.025 MG tablet Take 1 tablet by mouth 4 (four) times daily as needed for diarrhea or loose stools. 10/05/22  Yes Flossie Dibble, NP  clonazePAM (KLONOPIN) 1 MG tablet Take 1 mg by mouth 3 (three) times daily as needed. 10/11/21   [provider]  cloNIDine (CATAPRES) 0.2 MG tablet Take 0.4 mg by mouth at bedtime. 09/11/21   [provider]  dicyclomine (BENTYL) 20 MG tablet Take 1 tablet (20 mg total) by mouth 2 (two) times daily as needed for spasms. 10/14/21   Levin Erp, PA  HYDROcodone-acetaminophen (NORCO) 5-325 MG tablet Take 1-2 tablets by mouth every 6 (six) hours as needed. 10/04/22   Veryl Speak, MD  magic mouthwash  (nystatin, lidocaine, diphenhydrAMINE) suspension Take 5 mLs by mouth 4 (four) times daily as needed for mouth pain. Swish and spit 08/08/22   Raspet, Erin K, PA-C  omeprazole (PRILOSEC) 40 MG capsule TAKE 1 CAPSULE (40 MG TOTAL) BY MOUTH DAILY. 07/01/22   Mansouraty, Telford Nab., MD  promethazine (PHENERGAN) 25 MG tablet Take 1 tablet (25 mg total) by mouth every 8 (eight) hours as needed for nausea or vomiting. 10/14/21   Levin Erp, PA  modafinil (PROVIGIL) 100 MG tablet Take 50-100 mg by mouth See admin instructions. Take 1/2 to 1 tablet every morning and at noon 05/13/18 08/10/19  [provider]    Family History Family History  Problem Relation Age of Onset   Alzheimer's disease Mother    Hypertension Mother    Diabetes Mother    Anxiety  disorder Mother    Depression Mother    Post-traumatic stress disorder Mother    Irritable bowel syndrome Mother    Parkinson's disease Mother    Diabetes Father    Liver disease Maternal Grandfather    Pancreatic cancer Maternal Uncle    Stomach cancer Maternal Uncle    Diabetes Maternal Uncle    Colon cancer Maternal Uncle    Esophageal cancer Maternal Uncle    Anesthesia problems Neg Hx    Hypotension Neg Hx    Malignant hyperthermia Neg Hx    Pseudochol deficiency Neg Hx     Social History Social History   Tobacco Use   Smoking status: Every Day    Packs/day: 1.00    Types: Cigarettes   Smokeless tobacco: Never  Vaping Use   Vaping Use: Never used  Substance Use Topics   Alcohol use: Yes    Comment: occasional   Drug use: Never     Allergies   Ketorolac tromethamine, Amoxicillin, Penicillins, and Tramadol   Review of Systems Review of Systems  Constitutional:  Positive for activity change, appetite change and fatigue. Negative for chills and fever.  HENT: Negative.    Respiratory: Negative.  Negative for cough and shortness of breath.   Cardiovascular:  Negative for chest pain and palpitations.   Gastrointestinal:  Positive for abdominal distention, abdominal pain, diarrhea and nausea (Upon onset of symptoms). Negative for anal bleeding, blood in stool, constipation and vomiting.  Genitourinary:  Positive for menstrual problem (Reports irregular bleeding, reports history of ablation), vaginal bleeding (Intermittent spotting) and vaginal discharge. Negative for difficulty urinating, dysuria, flank pain, frequency, hematuria, pelvic pain, urgency and vaginal pain.     Physical Exam Triage Vital Signs ED Triage Vitals  Enc Vitals Group     BP 10/05/22 1724 112/72     Pulse Rate 10/05/22 1724 73     Resp 10/05/22 1724 18     Temp 10/05/22 1724 98.5 F (36.9 C)     Temp src --      SpO2 10/05/22 1724 95 %     Weight --      Height --      Head Circumference --      Peak Flow --      Pain Score 10/05/22 1722 8     Pain Loc --      Pain Edu? --      Excl. in [place]? --    No data found.  Updated Vital Signs BP 112/72   Pulse 73   Temp 98.5 F (36.9 C)   Resp 18   SpO2 95%      Physical Exam Vitals and nursing note reviewed.  Constitutional:      Appearance: She is well-developed.  Cardiovascular:     Rate and Rhythm: Normal rate and regular rhythm.     Heart sounds: Normal heart sounds, S1 normal and S2 normal.  Pulmonary:     Effort: Pulmonary effort is normal.     Breath sounds: Normal breath sounds and air entry. No decreased breath sounds, wheezing, rhonchi or rales.  Abdominal:     General: Bowel sounds are normal.     Palpations: Abdomen is soft. There is no shifting dullness, fluid wave, hepatomegaly, splenomegaly, mass or pulsatile mass.     Tenderness: There is generalized abdominal tenderness. There is no right CVA tenderness, left CVA tenderness, guarding or rebound. Negative signs include Murphy's sign and McBurney's sign.  Musculoskeletal:  Lumbar back: Tenderness present. No swelling, edema, deformity, signs of trauma, lacerations, spasms or bony  tenderness. Normal range of motion. Negative right straight leg raise test and negative left straight leg raise test. No scoliosis.       Back:     Comments: Tenderness upon palpation of RT lower lumbar area. No erythema, no bruising, no skin changes noted upon exam of lumbar area.   Neurological:     Mental Status: She is alert.      UC Treatments / Results  Labs (all labs ordered are listed, but only abnormal results are displayed) Labs Reviewed  POCT URINALYSIS DIPSTICK, ED / UC - Abnormal; Notable for the following components:      Result Value   Ketones, ur TRACE (*)    All other components within normal limits  POC URINE PREG, ED  CERVICOVAGINAL ANCILLARY ONLY    EKG   Radiology CT ABDOMEN PELVIS W CONTRAST  Result Date: 10/04/2022 CLINICAL DATA:  42 year old female with lower abdominal pain and diarrhea. EXAM: CT ABDOMEN AND PELVIS WITH CONTRAST TECHNIQUE: Multidetector CT imaging of the abdomen and pelvis was performed using the standard protocol following bolus administration of intravenous contrast. RADIATION DOSE REDUCTION: This exam was performed according to the departmental dose-optimization program which includes automated exposure control, adjustment of the mA and/or kV according to patient size and/or use of iterative reconstruction technique. CONTRAST:  124m OMNIPAQUE IOHEXOL 300 MG/ML  SOLN COMPARISON:  CT Abdomen and Pelvis 09/22/2021. FINDINGS: Lower chest: Chronic incidental breast implants. Mild symmetric lung base atelectasis. No pericardial or pleural effusion. Hepatobiliary: Negative liver and gallbladder. Pancreas: Negative. Spleen: Negative. Adrenals/Urinary Tract: Normal adrenal glands. Kidneys appears symmetric and normal. No nephrolithiasis, hydronephrosis or evidence of renal inflammation. Symmetric decompressed ureters. Diminutive, unremarkable bladder. Chronic right hemipelvis phlebolith. Stomach/Bowel: Normal retrocecal appendix on series 2, image 47.  Negative ascending colon. Abundant transverse colon retained stool. Decompressed descending colon. Mild retained stool at the junction of the descending and sigmoid colon. Redundant sigmoid colon, partially decompressed and with no convincing active inflammation. Negative terminal ileum. No dilated small bowel. Decompressed stomach and duodenum. No free air, abdominal free fluid, or mesenteric inflammation identified. Vascular/Lymphatic: Mild Calcified aortic atherosclerosis. Major arterial structures are patent. Portal venous system is patent. No lymphadenopathy identified. Reproductive: Within normal limits. Other: Trace pelvis free fluid, simple fluid density. Musculoskeletal: No acute osseous abnormality identified. Mild for age degeneration in the spine. IMPRESSION: 1. No acute or inflammatory process identified in the abdomen. Normal appendix. 2. Trace pelvis free fluid is likely physiologic. 3. Mild Calcified aortic atherosclerosis. Electronically Signed   By: HGenevie AnnM.D.   On: 10/04/2022 06:44    Procedures Procedures (including critical care time)  Medications Ordered in UC Medications - No data to display  Initial Impression / Assessment and Plan / UC Course  I have reviewed the triage vital signs and the nursing notes.  Pertinent labs & imaging results that were available during my care of the patient were reviewed by me and considered in my medical decision making (see chart for details).     Patient was evaluated for generalized abdominal pain, loose stools, vaginal discharge.  ED visit from yesterday was reviewed, CT scan was unremarkable, blood work collected was unremarkable.  Cytology swab is pending for vaginal discharge, uncertain etiology.  Patient was made aware that she should establish care with a GYN provider and follow-up due to reported irregular bleeding.  Urine pregnancy was negative.  Urinalysis showed trace  ketones, otherwise unremarkable.  Lomotil was prescribed,  patient was made aware of treatment regiment.  PDMP was reviewed.  Patient was made aware that she can begin taking probiotics daily.  High suspicion that abdominal symptoms are related to continuation of IBS.  Patient was made aware that she will need to call and schedule an appointment with her gastroenterologist tomorrow.  Patient was made aware of results reporting protocol for vaginal swab.  Patient verbalized understanding of instructions.   Charting was provided using a a verbal dictation system, charting was proofread for errors, errors may occur which could change the meaning of the information charted.    Final Clinical Impressions(s) / UC Diagnoses   Final diagnoses:  Generalized abdominal pain  Loose stools  Vaginal discharge     Discharge Instructions      We will call you if any of your test results warrant a change in plan of care.  I have sent Lomotil to the pharmacy, you can take this medication 4 times daily as needed for loose stools, please do not take more than 8 tablets in a 24-hour period.  I recommend that she start taking a daily probiotic, Culturelle is 1 brand of probiotics that you can purchase over-the-counter, take 1 capsule daily.  Please call and schedule follow-up with your gastroenterologist tomorrow.   I have also attached information for GYN provider, I suggest that she call and establish care with this provider for the irregular bleeding.       ED Prescriptions     Medication Sig Dispense Auth. Provider   diphenoxylate-atropine (LOMOTIL) 2.5-0.025 MG tablet Take 1 tablet by mouth 4 (four) times daily as needed for diarrhea or loose stools. 16 tablet Flossie Dibble, NP      I have reviewed the PDMP during this encounter.   Flossie Dibble, NP 10/05/22 1915    Flossie Dibble, NP 10/05/22 [phone]

## 2022-10-05 NOTE — ED Triage Notes (Signed)
Pt reports diarrhea  and ABD pain since Monday. Pt has been to [facility] but left due to a 24 HR wait. Pt was then seen at [facility] and had LABS and CT done. Pt reports she is not better.

## 2022-10-05 NOTE — Discharge Instructions (Addendum)
We will call you if any of your test results warrant a change in plan of care.  I have sent Lomotil to the pharmacy, you can take this medication 4 times daily as needed for loose stools, please do not take more than 8 tablets in a 24-hour period.  I recommend that she start taking a daily probiotic, Culturelle is 1 brand of probiotics that you can purchase over-the-counter, take 1 capsule daily.  Please call and schedule follow-up with your gastroenterologist tomorrow.   I have also attached information for GYN provider, I suggest that she call and establish care with this provider for the irregular bleeding.

## 2022-10-06 ENCOUNTER — Ambulatory Visit (HOSPITAL_COMMUNITY): Payer: Self-pay

## 2022-10-06 ENCOUNTER — Telehealth (HOSPITAL_COMMUNITY): Payer: Self-pay | Admitting: Emergency Medicine

## 2022-10-06 LAB — CERVICOVAGINAL ANCILLARY ONLY
Bacterial Vaginitis (gardnerella): NEGATIVE
Candida Glabrata: POSITIVE — AB
Candida Vaginitis: NEGATIVE
Chlamydia: NEGATIVE
Comment: NEGATIVE
Comment: NEGATIVE
Comment: NEGATIVE
Comment: NEGATIVE
Comment: NEGATIVE
Comment: NORMAL
Neisseria Gonorrhea: NEGATIVE
Trichomonas: NEGATIVE

## 2022-10-06 MED ORDER — FLUCONAZOLE 150 MG PO TABS: 150.0000 mg | ORAL_TABLET | Freq: Once | ORAL | 0 refills | Status: AC

## 2022-10-15 ENCOUNTER — Other Ambulatory Visit: Payer: Self-pay | Admitting: Physician Assistant

## 2022-10-23 NOTE — Progress Notes (Signed)
10/27/2022 Mary Hogan Mary Hogan:[id number] May 31, 1981  Referring provider: [facility] Primary GI doctor: Dr. Rush Landmark  ASSESSMENT AND PLAN:   Irritable bowel syndrome with constipation/diarrhea and AB bloating with AB pain 09/2021 CT unremarkable other than constipation From symptoms sound more like IBS constipation, will do trial of Linzess 145 mcg Will set up for SIBO testing Add on IBgard, discussed FODMAP diet Follow-up 2 to 3 months  Gastroesophageal reflux disease with history of hiatal hernia EGD 11/2021 negative H. pylori gastritis Avoid NSAIDs, no alcohol.  Lifestyle discussed. Continue omeprazole 40 mg  Screening colonoscopy 10/2018 colonoscopy hemorrhoids otherwise unremarkable    Patient Care Team: [facility] as PCP - General (Pediatrics)  HISTORY OF PRESENT ILLNESS: 42 y.o. female with a past medical history of anxiety, depression, hepatomegaly, IBS and others listed below presents for evaluation of abdominal pain and mucus in her stool.   10/2018 colonoscopy with nonbleeding nonthrombosed internal hemorrhoids and otherwise normal. EGD at the same time with normal mucosa throughout the duodenum into the third portion of the duodenum and fourth portion of the duodenum and otherwise normal.  03/14/2019 EUS to evaluate region surrounding the pancreatic head seen on CT at the time showed 2 inflamed lymph nodes and was otherwise normal. Patient restarted on Pantoprazole 40 daily. Pathology showed no abnormalities other than some mild nonspecific reactive gastropathy.  10/31/2020 AB [place] negative gallstones, coarse liver with steatosis or hepatocellular disease [phone] CT abdomen pelvis with contrast showed no acute findings, mild increased stool burden in the colon and otherwise normal.  11/13/21 EGD to evaluate abdominal pain/bloating history of peptic ulcer disease and GERD 1 cm hiatal hernia, normal esophagus erythematous  mucosa in the antrum but no lesions, normal duodenum.  Pathology negative for H. pylori, celiac, precancerous changes. Supposed be set up for pancreatic elastase as well as SIBO testing however this was not completed.  Since her upper endoscopy patient's had several visits to the ER. 08/08/2022 patient had dental infection and given clindamycin. Had 3 subsequent ER visits for abdominal pain, 10/03/2022 patient left without being seen, returned 10/04/22.  At that visit she had unremarkable CBC, no leukocytosis no anemia, normal kidney and liver without electrolyte abnormalities lipase less than 10.   CT abdomen pelvis with contrast showed no acute inflammatory process, normal appendix, trace pelvis free fluid. 10/05/2022 went to urgent care complaint abdominal pain and diarrhea for 1 week.  Has had copious amounts of mucus and foul odor, abdominal distention.  She states she has had decreased appetite. She has fatigue, has had 14 lbs weight loss in a year.  She was having AB pain with mucus in her stool, with small volume blood in mucus, otherwise no hematochezia. .  She has not had a BM in a week, can be longer between stools, then when she does have a BM, has small balls of stool that are very hard.  Can be dark black stools at time.  She is not on pepto or iron.  She has nausea no vomiting, she has bloating and feels her AB swells.  She is on ibuprofen 800 1-2 pills daily for AB pain which helps some. Norco was from ER doctor, which helped, not on it normally. No drug use, no ETOH,    Wt Readings from Last 3 Encounters:  10/27/22 178 lb (80.7 kg)  11/13/21 194 lb (88 kg)  10/14/21 194 lb 4 oz (88.1 kg)     She  reports that she has been smoking cigarettes. She has been smoking an average of 1 pack per day. She has never used smokeless tobacco. She reports current alcohol use. She reports that she does not use drugs.  RELEVANT LABS AND IMAGING: CBC    Component Value Date/Time   WBC 9.4  10/04/2022 0103   RBC 4.15 10/04/2022 0103   HGB 12.7 10/04/2022 0103   HCT 38.9 10/04/2022 0103   PLT 245 10/04/2022 0103   MCV 93.7 10/04/2022 0103   MCH 30.6 10/04/2022 0103   MCHC 32.6 10/04/2022 0103   RDW 12.9 10/04/2022 0103   LYMPHSABS 2.5 03/23/2017 0120   MONOABS 1.0 03/23/2017 0120   EOSABS 0.2 03/23/2017 0120   BASOSABS 0.0 03/23/2017 0120   Recent Labs    10/04/22 0103  HGB 12.7     CMP     Component Value Date/Time   NA 138 10/04/2022 0103   K 3.8 10/04/2022 0103   CL 101 10/04/2022 0103   CO2 30 10/04/2022 0103   GLUCOSE 77 10/04/2022 0103   BUN 12 10/04/2022 0103   CREATININE 0.89 10/04/2022 0103   CALCIUM 9.4 10/04/2022 0103   PROT 7.3 10/04/2022 0103   ALBUMIN 4.3 10/04/2022 0103   AST 11 (L) 10/04/2022 0103   ALT 8 10/04/2022 0103   ALKPHOS 66 10/04/2022 0103   BILITOT 0.3 10/04/2022 0103   GFRNONAA >60 10/04/2022 0103   GFRAA >60 10/18/2019 1816      Latest Ref Rng & Units 10/04/2022    1:03 AM 09/22/2021    5:07 PM 08/13/2020    4:04 PM  Hepatic Function  Total Protein 6.5 - 8.1 g/dL 7.3  7.2  6.5   Albumin 3.5 - 5.0 g/dL 4.3  4.1  3.6   AST 15 - 41 U/L 11  12  15   $ ALT 0 - 44 U/L 8  8  9   $ Alk Phosphatase 38 - 126 U/L 66  69  50   Total Bilirubin 0.3 - 1.2 mg/dL 0.3  0.3  0.3       Current Medications:    Current Outpatient Medications (Cardiovascular):    cloNIDine (CATAPRES) 0.2 MG tablet, Take 0.4 mg by mouth at bedtime.  Current Outpatient Medications (Respiratory):    magic mouthwash (nystatin, lidocaine, diphenhydrAMINE) suspension*, Take 5 mLs by mouth 4 (four) times daily as needed for mouth pain. Swish and spit   promethazine (PHENERGAN) 25 MG tablet, Take 1 tablet (25 mg total) by mouth every 8 (eight) hours as needed for nausea or vomiting. (Patient not taking: Reported on 10/27/2022)  Current Outpatient Medications (Analgesics):    HYDROcodone-acetaminophen (NORCO) 5-325 MG tablet, Take 1-2 tablets by mouth every 6 (six)  hours as needed. (Patient not taking: Reported on 10/27/2022)   Current Outpatient Medications (Other):    clonazePAM (KLONOPIN) 1 MG tablet, Take 1 mg by mouth 3 (three) times daily as needed.   dicyclomine (BENTYL) 20 MG tablet, Take 1 tablet (20 mg total) by mouth 2 (two) times daily as needed for spasms.   magic mouthwash (nystatin, lidocaine, diphenhydrAMINE) suspension*, Take 5 mLs by mouth 4 (four) times daily as needed for mouth pain. Swish and spit   diphenoxylate-atropine (LOMOTIL) 2.5-0.025 MG tablet, Take 1 tablet by mouth 4 (four) times daily as needed for diarrhea or loose stools. (Patient not taking: Reported on 10/27/2022) * These medications belong to multiple therapeutic classes and are listed under each applicable group.  Medical History:  Past Medical  History:  Diagnosis Date   Abnormal Pap smear    Anxiety    Depression    Enlarged liver    Headache(784.0)    IBS (irritable bowel syndrome)    Scoliosis     Surgical History:  She  has a past surgical history that includes Dilation and curettage of uterus; Laparoscopic ovarian cystectomy (2012); Breast enhancement surgery (2008); Tubal ligation; Endometrial ablation; Esophagogastroduodenoscopy (egd) with propofol (N/A, 03/14/2019); EUS (N/A, 03/14/2019); biopsy (03/14/2019); and Fine needle aspiration (03/14/2019). Family History:  Her family history includes Alzheimer's disease in her mother; Anxiety disorder in her mother; Colon cancer in her maternal uncle; Depression in her mother; Diabetes in her father, maternal uncle, and mother; Esophageal cancer in her maternal uncle; Hypertension in her mother; Irritable bowel syndrome in her mother; Liver disease in her maternal grandfather; Pancreatic cancer in her maternal uncle; Parkinson's disease in her mother; Post-traumatic stress disorder in her mother; Stomach cancer in her maternal uncle.  REVIEW OF SYSTEMS  : All other systems reviewed and negative except where noted in the  History of Present Illness.  PHYSICAL EXAM: BP 110/60   Pulse 80   Ht 5' 6"$  (1.676 m)   Wt 178 lb (80.7 kg)   BMI 28.73 kg/m  General Appearance: Well nourished, in no apparent distress. Head:   Normocephalic and atraumatic. Eyes:  sclerae anicteric,conjunctive pink  Respiratory: Respiratory effort normal, BS equal bilaterally without rales, rhonchi, wheezing. Cardio: RRR with no MRGs. Peripheral pulses intact.  Abdomen: Soft,  Obese ,active bowel sounds. mild tenderness in the entire abdomen. Without guarding and Without rebound. No masses. Rectal: Not evaluated Musculoskeletal: Full ROM, Normal gait. Without edema. Skin:  Dry and intact without significant lesions or rashes Neuro: Alert and  oriented x4;  No focal deficits. Psych:  Cooperative. Normal mood and affect.    Vladimir Crofts, PA-C 12:27 PM

## 2022-10-27 ENCOUNTER — Ambulatory Visit (INDEPENDENT_AMBULATORY_CARE_PROVIDER_SITE_OTHER): Payer: Medicaid Other | Admitting: Physician Assistant

## 2022-10-27 ENCOUNTER — Other Ambulatory Visit (INDEPENDENT_AMBULATORY_CARE_PROVIDER_SITE_OTHER): Payer: Medicaid Other

## 2022-10-27 ENCOUNTER — Encounter: Payer: Self-pay | Admitting: Physician Assistant

## 2022-10-27 VITALS — BP 110/60 | HR 80 | Ht 66.0 in | Wt 178.0 lb

## 2022-10-27 DIAGNOSIS — K581 Irritable bowel syndrome with constipation: Secondary | ICD-10-CM

## 2022-10-27 DIAGNOSIS — R1084 Generalized abdominal pain: Secondary | ICD-10-CM

## 2022-10-27 DIAGNOSIS — K219 Gastro-esophageal reflux disease without esophagitis: Secondary | ICD-10-CM

## 2022-10-27 DIAGNOSIS — R140 Abdominal distension (gaseous): Secondary | ICD-10-CM | POA: Diagnosis not present

## 2022-10-27 LAB — COMPREHENSIVE METABOLIC PANEL
ALT: 6 U/L (ref 0–35)
AST: 12 U/L (ref 0–37)
Albumin: 4.1 g/dL (ref 3.5–5.2)
Alkaline Phosphatase: 59 U/L (ref 39–117)
BUN: 13 mg/dL (ref 6–23)
CO2: 29 mEq/L (ref 19–32)
Calcium: 9 mg/dL (ref 8.4–10.5)
Chloride: 103 mEq/L (ref 96–112)
Creatinine, Ser: 0.9 mg/dL (ref 0.40–1.20)
GFR: 78.38 mL/min (ref >60.00)
Glucose, Bld: 94 mg/dL (ref 70–99)
Potassium: 3.6 mEq/L (ref 3.5–5.1)
Sodium: 140 mEq/L (ref 135–145)
Total Bilirubin: 0.4 mg/dL (ref 0.2–1.2)
Total Protein: 6.7 g/dL (ref 6.0–8.3)

## 2022-10-27 LAB — CBC WITH DIFFERENTIAL/PLATELET
Basophils Absolute: 0.1 10*3/uL (ref 0.0–0.1)
Basophils Relative: 0.9 % (ref 0.0–3.0)
Eosinophils Absolute: 0.4 10*3/uL (ref 0.0–0.7)
Eosinophils Relative: 4.3 % (ref 0.0–5.0)
HCT: 38.7 % (ref 36.0–46.0)
Hemoglobin: 12.8 g/dL (ref 12.0–15.0)
Lymphocytes Relative: 21.5 % (ref 12.0–46.0)
Lymphs Abs: 2 10*3/uL (ref 0.7–4.0)
MCHC: 33.2 g/dL (ref 30.0–36.0)
MCV: 92.2 fl (ref 78.0–100.0)
Monocytes Absolute: 0.7 10*3/uL (ref 0.1–1.0)
Monocytes Relative: 7.9 % (ref 3.0–12.0)
Neutro Abs: 6.1 10*3/uL (ref 1.4–7.7)
Neutrophils Relative %: 65.4 % (ref 43.0–77.0)
Platelets: 217 10*3/uL (ref 150.0–400.0)
RBC: 4.2 Mil/uL (ref 3.87–5.11)
RDW: 13.6 % (ref 11.5–15.5)
WBC: 9.3 10*3/uL (ref 4.0–10.5)

## 2022-10-27 LAB — TSH: TSH: 1.07 u[IU]/mL (ref 0.35–5.50)

## 2022-10-27 LAB — SEDIMENTATION RATE: Sed Rate: 7 mm/hr (ref 0–20)

## 2022-10-27 MED ORDER — ESOMEPRAZOLE MAGNESIUM 20 MG PO CPDR: 20.0000 mg | DELAYED_RELEASE_CAPSULE | Freq: Every day | ORAL | 3 refills | Status: DC

## 2022-10-27 NOTE — Progress Notes (Signed)
Attending Physician's Attestation   I have reviewed the chart.   I agree with the Advanced Practitioner's note, impression, and recommendations with any updates as below.    Alveta Quintela Mansouraty, MD [facility] Office # [phone]  

## 2022-10-27 NOTE — Patient Instructions (Addendum)
Your provider has requested that you go to the basement level for lab work before leaving today. Press "B" on the elevator. The lab is located at the first door on the left as you exit the elevator.  You have been given a testing kit to check for small intestine bacterial overgrowth (SIBO) which is completed by a company named [facility]. Make sure to return your test in the mail using the return mailing label given to you along with the kit. Your demographic and insurance information have already been sent to the company and they should be in contact with you over the next 1-2 weeks regarding this test. [facility] will collect an upfront charge of $99.74 for commercial insurance plans and $209.74 is you are paying cash. Make sure to discuss with [facility] PRIOR to having the test to see if they have gotten information from your insurance company as to how much your testing will cost out of pocket, if any. Please keep in mind that you will be getting a call from phone number [phone] or a similar number. If you do not hear from them within this time frame, please call our office at [phone] or call [facility] directly at [phone].    Linzess 145 mcg and if that does not help 290 mcg  *IBS-C patients may begin to experience relief from belly pain and overall abdominal symptoms (pain, discomfort, and bloating) in about 1 week,  with symptoms typically improving over 12 weeks.  Take at least 30 minutes before the first meal of the day on an empty stomach You can have a loose stool if you eat a high-fat breakfast. Give it at least 7 days, may have more bowel movements during that time.   The diarrhea should go away and you should start having normal, complete, full bowel movements.  It may be helpful to start treatment when you can be near the comfort of your own bathroom, such as a weekend.  After you are out we can send in a prescription if you did well, there is a  prescription card  We may want to evaluate you for small intestinal bacterial overgrowth, this can cause increase gas, bloating, loose stools or constipation.  If you take the linzess and it is not helping, please test for the SIBO at home.  There is a test for this we can do or sometimes we will treat a patient with an antibiotic to see if it helps.   Add fiber like benefiber or citracel once a day Increase activity Can do trial of IBGard which is over the counter for AB pain- Take 1-2 capsules once a day for maintence or twice a day during a flare Can send in an anti spasm medication, Bentyl, to take as needed   Please try low FODMAP diet- see below- start with eliminating just one column at a time, the table at the very bottom contains foods that are safe to take   FODMAP stands for fermentable oligo-, di-, mono-saccharides and polyols (1). These are the scientific terms used to classify groups of carbs that are notorious for triggering digestive symptoms like bloating, gas and stomach pain.    Please take your proton pump inhibitor medication, omeprazole- increase to twice a day for 2-4 weeks and see if this helps   Please take this medication 30 minutes to 1 hour before meals- this makes it more effective.  Avoid spicy and acidic foods Avoid fatty foods Limit your intake of coffee, tea, alcohol, and  carbonated drinks Work to maintain a healthy weight Keep the head of the bed elevated at least 3 inches with blocks or a wedge pillow if you are having any nighttime symptoms Stay upright for 2 hours after eating Avoid meals and snacks three to four hours before bedtime   SMOKING CESSATION  [employer] society  NX:2814358 for more information or for a free program for smoking cessation help.   You can call QUIT SMART 1-800-QUIT-NOW for free nicotine patches or replacement therapy- if they are out- keep calling  [facility] Can call for smoking cessation  classes, [phone]  If you have a smart phone, please look up Smoke Free app, this will help you stay on track and give you information about money you have saved, life that you have gained back and a ton of more information.     ADVANTAGES OF QUITTING SMOKING Within 20 minutes, blood pressure decreases. Your pulse is at normal level. After 8 hours, carbon monoxide levels in the blood return to normal. Your oxygen level increases. After 24 hours, the chance of having a heart attack starts to decrease. Your breath, hair, and body stop smelling like smoke. After 48 hours, damaged nerve endings begin to recover. Your sense of taste and smell improve. After 72 hours, the body is virtually free of nicotine. Your bronchial tubes relax and breathing becomes easier. After 2 to 12 weeks, lungs can hold more air. Exercise becomes easier and circulation improves. After 1 year, the risk of coronary heart disease is cut in half. After 5 years, the risk of stroke falls to the same as a nonsmoker. After 10 years, the risk of lung cancer is cut in half and the risk of other cancers decreases significantly. After 15 years, the risk of coronary heart disease drops, usually to the level of a nonsmoker. You will have extra money to spend on things other than cigarettes.   If your blood pressure at your visit was 140/90 or greater, please contact your primary care physician to follow up on this.  If you are age 31 or older, your body mass index should be between 23-30. Your Body mass index is 28.73 kg/m. If this is out of the aforementioned range listed, please consider follow up with your Primary Care Provider.  If you are age 53 or younger, your body mass index should be between 19-25. Your Body mass index is 28.73 kg/m. If this is out of the aformentioned range listed, please consider follow up with your Primary Care Provider.    The [facility] providers would like to encourage you to use [facility] to  communicate with providers for non-urgent requests or questions.  Due to long hold times on the telephone, sending your provider a message by [facility] may be a faster and more efficient way to get a response.  Please allow 48 business hours for a response.  Please remember that this is for non-urgent requests.   Thank you for entrusting me with your care and choosing [facility].  Vicie Mutters PA-C

## 2022-11-20 ENCOUNTER — Other Ambulatory Visit: Payer: Self-pay | Admitting: Physician Assistant

## 2022-11-26 ENCOUNTER — Encounter (HOSPITAL_COMMUNITY): Payer: Self-pay

## 2022-11-26 ENCOUNTER — Ambulatory Visit (HOSPITAL_COMMUNITY)
Admission: RE | Admit: 2022-11-26 | Discharge: 2022-11-26 | Disposition: A | Discharge status: Home / Self Care | Payer: Medicaid Other | Source: Ambulatory Visit | Attending: Emergency Medicine | Admitting: Emergency Medicine

## 2022-11-26 VITALS — BP 134/80 | HR 89 | Temp 98.4°F | Resp 16 | Ht 66.0 in | Wt 174.0 lb

## 2022-11-26 DIAGNOSIS — U071 COVID-19: Secondary | ICD-10-CM | POA: Insufficient documentation

## 2022-11-26 MED ORDER — CYCLOBENZAPRINE HCL 5 MG PO TABS: 5.0000 mg | ORAL_TABLET | Freq: Three times a day (TID) | ORAL | 0 refills | Status: DC | PRN

## 2022-11-26 MED ORDER — ONDANSETRON 4 MG PO TBDP: 4.0000 mg | ORAL_TABLET | Freq: Three times a day (TID) | ORAL | 0 refills | Status: DC | PRN

## 2022-11-26 MED ORDER — DEXAMETHASONE SODIUM PHOSPHATE 10 MG/ML IJ SOLN
INTRAMUSCULAR | Status: AC
  Filled 2022-11-26: qty 1

## 2022-11-26 MED ORDER — DEXAMETHASONE SODIUM PHOSPHATE 10 MG/ML IJ SOLN
10.0000 mg | Freq: Once | INTRAMUSCULAR | Status: AC
  Administered 2022-11-26: 10 mg via INTRAMUSCULAR

## 2022-11-26 NOTE — ED Triage Notes (Signed)
"  I feel nauseous and I have been having a migraine headache for two days.  Can't taste anything I eat and very tired. Body aches all over and mouth dry.  Even when I drink my mouth is sticking to my lips. - Entered by patient" Onset yesterday.  Green nasal drainage with blood. Ear, throat, and head pain on the left. Niece had covid, last seen her last week.

## 2022-11-26 NOTE — ED Provider Notes (Signed)
[facility]    CSN: [id number] Arrival date & time: 11/26/22  1541      History   Chief Complaint Chief Complaint  Patient presents with   Nausea    I feel nauseous and I have been having a migraine headache for two days.  Can't taste anything I eat and very tired. Body aches all over and mouth dry.  Even when I drink my mouth is sticking to my lips. - Entered by patient    HPI Mary Hogan is a 42 y.o. female.   Patient presents for evaluation of fever, chills, body aches nasal congestion, rhinorrhea, cough, nausea without vomiting and headaches present for 2 days.  Fever peaking at 103.  Body aches described as severe, generalized.  Mucus from the nose is described as green with blood.  Associated decreased taste and smell making it difficult to tolerate food but tolerating liquids.  Known exposure to COVID.  Home COVID test positive.    Past Medical History:  Diagnosis Date   Abnormal Pap smear    Anxiety    Depression    Enlarged liver    Headache(784.0)    IBS (irritable bowel syndrome)    Scoliosis     Patient Active Problem List   Diagnosis Date Noted   Nausea without vomiting 12/26/2018   Abdominal pain 09/24/2018   Abnormal CT of the abdomen 09/24/2018   Other constipation 09/24/2018   Change in bowel habits 09/24/2018   Anxiety 07/07/2018   Encounter for blood transfusion 07/07/2018   Scoliosis 07/07/2018   Psychotic disorder ([place]) 01/20/2018   Methamphetamine abuse ([place]) 10/27/2017   UTI due to extended-spectrum beta lactamase (ESBL) producing Escherichia coli 06/09/2016   Bipolar affective disorder, currently manic, moderate ([place]) 06/05/2016   URI (upper respiratory infection) 12/23/2013   Attention deficit disorder 06/14/2013   Generalized anxiety disorder 06/14/2013   Essential (primary) hypertension 05/02/2013   SOB (shortness of breath) 05/02/2013   Chest pain 03/08/2012   MVP (mitral valve prolapse) 03/08/2012   Palpitations  03/08/2012   Hx of migraine headaches 02/19/2012   Polysubstance overdose 12/25/2011   Bipolar disorder current episode depressed ([place]) 12/06/2011    Class: Acute   IBS (irritable bowel syndrome) 10/13/2011   Epigastric pain 08/27/2011   Chronic pain 03/05/2011   Migraine 03/05/2011   TOBACCO USER 05/26/2009   AMENORRHEA, SECONDARY 01/26/2007   DEPRESSIVE DISORDER, NOS 11/05/2006   RESTLESS LEGS SYNDROME 11/05/2006   PAPANICOLAOU SMEAR, ABNORMAL 11/05/2006   PAPANICOLAOU SMEAR, ABNORMAL 11/05/2006    Past Surgical History:  Procedure Laterality Date   BIOPSY  03/14/2019   Procedure: BIOPSY;  Surgeon: Irving Copas., MD;  Location: [facility];  Service: Gastroenterology;;   BREAST ENHANCEMENT SURGERY  2008   DILATION AND CURETTAGE OF UTERUS     ENDOMETRIAL ABLATION     ESOPHAGOGASTRODUODENOSCOPY (EGD) WITH PROPOFOL N/A 03/14/2019   Procedure: ESOPHAGOGASTRODUODENOSCOPY (EGD) WITH PROPOFOL;  Surgeon: Irving Copas., MD;  Location: [facility];  Service: Gastroenterology;  Laterality: N/A;   EUS N/A 03/14/2019   Procedure: UPPER ENDOSCOPIC ULTRASOUND (EUS) RADIAL;  Surgeon: Irving Copas., MD;  Location: [facility];  Service: Gastroenterology;  Laterality: N/A;   FINE NEEDLE ASPIRATION  03/14/2019   Procedure: FINE NEEDLE ASPIRATION (FNA) LINEAR;  Surgeon: Irving Copas., MD;  Location: [facility];  Service: Gastroenterology;;   LAPAROSCOPIC OVARIAN CYSTECTOMY  2012   TUBAL LIGATION      OB History     Gravida  9  Para  4   Term  2   Preterm  2   AB  5   Living  4      SAB  2   IAB  2   Ectopic  1   Multiple      Live Births               Home Medications    Prior to Admission medications   Medication Sig Start Date End Date Taking? Authorizing Provider  clonazePAM (KLONOPIN) 1 MG tablet Take 1 mg by mouth 3 (three) times daily as needed. 10/11/21  Yes [provider]  cloNIDine (CATAPRES) 0.2 MG tablet  Take 0.4 mg by mouth at bedtime. 09/11/21  Yes [provider]  dicyclomine (BENTYL) 20 MG tablet TAKE 1 TABLET (20 MG TOTAL) BY MOUTH 2 (TWO) TIMES DAILY AS NEEDED FOR SPASMS. 11/20/22   Levin Erp, PA  diphenoxylate-atropine (LOMOTIL) 2.5-0.025 MG tablet Take 1 tablet by mouth 4 (four) times daily as needed for diarrhea or loose stools. Patient not taking: Reported on 10/27/2022 10/05/22   Flossie Dibble, NP  HYDROcodone-acetaminophen (NORCO) 5-325 MG tablet Take 1-2 tablets by mouth every 6 (six) hours as needed. Patient not taking: Reported on 10/27/2022 10/04/22   Veryl Speak, MD  magic mouthwash (nystatin, lidocaine, diphenhydrAMINE) suspension Take 5 mLs by mouth 4 (four) times daily as needed for mouth pain. Swish and spit 08/08/22   Raspet, Junie Panning K, PA-C  promethazine (PHENERGAN) 25 MG tablet Take 1 tablet (25 mg total) by mouth every 8 (eight) hours as needed for nausea or vomiting. Patient not taking: Reported on 10/27/2022 10/14/21   Levin Erp, PA  modafinil (PROVIGIL) 100 MG tablet Take 50-100 mg by mouth See admin instructions. Take 1/2 to 1 tablet every morning and at noon 05/13/18 08/10/19  [provider]    Family History Family History  Problem Relation Age of Onset   Alzheimer's disease Mother    Hypertension Mother    Diabetes Mother    Anxiety disorder Mother    Depression Mother    Post-traumatic stress disorder Mother    Irritable bowel syndrome Mother    Parkinson's disease Mother    Diabetes Father    Liver disease Maternal Grandfather    Pancreatic cancer Maternal Uncle    Stomach cancer Maternal Uncle    Diabetes Maternal Uncle    Colon cancer Maternal Uncle    Esophageal cancer Maternal Uncle    Anesthesia problems Neg Hx    Hypotension Neg Hx    Malignant hyperthermia Neg Hx    Pseudochol deficiency Neg Hx     Social History Social History   Tobacco Use   Smoking status: Every Day    Packs/day: 1    Types:  Cigarettes   Smokeless tobacco: Never  Vaping Use   Vaping Use: Never used  Substance Use Topics   Alcohol use: Yes    Comment: occasional   Drug use: Never     Allergies   Ketorolac tromethamine, Amoxicillin, Penicillins, and Tramadol   Review of Systems Review of Systems  Constitutional:  Positive for fever. Negative for activity change, appetite change, chills, diaphoresis, fatigue and unexpected weight change.  HENT:  Positive for congestion, rhinorrhea and sore throat. Negative for dental problem, drooling, ear discharge, ear pain, facial swelling, hearing loss, mouth sores, nosebleeds, postnasal drip, sinus pressure, sinus pain, sneezing, tinnitus, trouble swallowing and voice change.   Respiratory:  Positive for cough. Negative  for apnea, choking, chest tightness, shortness of breath, wheezing and stridor.   Cardiovascular: Negative.   Gastrointestinal:  Positive for nausea. Negative for abdominal distention, abdominal pain, anal bleeding, blood in stool, constipation, diarrhea, rectal pain and vomiting.  Musculoskeletal:  Positive for myalgias. Negative for arthralgias, back pain, gait problem, joint swelling, neck pain and neck stiffness.  Skin: Negative.      Physical Exam Triage Vital Signs ED Triage Vitals  Enc Vitals Group     BP 11/26/22 1603 134/80     Pulse Rate 11/26/22 1603 89     Resp 11/26/22 1603 16     Temp 11/26/22 1603 98.4 F (36.9 C)     Temp Source 11/26/22 1603 Oral     SpO2 11/26/22 1603 96 %     Weight 11/26/22 1603 174 lb (78.9 kg)     Height 11/26/22 1603 5\' 6"  (1.676 m)     Head Circumference --      Peak Flow --      Pain Score 11/26/22 1602 10     Pain Loc --      Pain Edu? --      Excl. in [place]? --    No data found.  Updated Vital Signs BP 134/80 (BP Location: Right Arm)   Pulse 89   Temp 98.4 F (36.9 C) (Oral)   Resp 16   Ht 5\' 6"  (1.676 m)   Wt 174 lb (78.9 kg)   SpO2 96%   BMI 28.08 kg/m   Visual Acuity Right Eye  Distance:   Left Eye Distance:   Bilateral Distance:    Right Eye Near:   Left Eye Near:    Bilateral Near:     Physical Exam Constitutional:      Appearance: Normal appearance.  HENT:     Head: Normocephalic.     Right Ear: Tympanic membrane, ear canal and external ear normal.     Left Ear: Tympanic membrane, ear canal and external ear normal.     Nose: Congestion present. No rhinorrhea.     Mouth/Throat:     Mouth: Mucous membranes are moist.     Pharynx: Posterior oropharyngeal erythema present.  Eyes:     Extraocular Movements: Extraocular movements intact.  Cardiovascular:     Rate and Rhythm: Normal rate and regular rhythm.     Pulses: Normal pulses.     Heart sounds: Normal heart sounds.  Pulmonary:     Effort: Pulmonary effort is normal.     Breath sounds: Normal breath sounds.  Skin:    General: Skin is warm and dry.  Neurological:     Mental Status: She is alert and oriented to person, place, and time. Mental status is at baseline.      UC Treatments / Results  Labs (all labs ordered are listed, but only abnormal results are displayed) Labs Reviewed  SARS CORONAVIRUS 2 (TAT 6-24 HRS)    EKG   Radiology No results found.  Procedures Procedures (including critical care time)  Medications Ordered in UC Medications - No data to display  Initial Impression / Assessment and Plan / UC Course  I have reviewed the triage vital signs and the nursing notes.  Pertinent labs & imaging results that were available during my care of the patient were reviewed by me and considered in my medical decision making (see chart for details).  Covid 19  Home testing positive, PCR completed for patient's work, pending, as a young healthy adult does  not qualify for antivirals, treating with supportive measures, Decadron given in office, history of migraines prescribed Zofran and Flexeril for outpatient use, recommended additional over-the-counter supportive measures with  urgent care follow-up as needed, work note given Final Clinical Impressions(s) / UC Diagnoses   Final diagnoses:  None   Discharge Instructions   None    ED Prescriptions   None    PDMP not reviewed this encounter.   Hans Eden, NP 11/26/22 1640

## 2022-11-26 NOTE — Discharge Instructions (Signed)
Your symptoms today are most likely being caused by a virus and should steadily improve in time it can take up to 7 to 10 days before you truly start to see a turnaround however things will get better  Test is positive, COVID test in office is pending up to 24 hours, you will be notified positive test results  You will need to quarantine for 5 days per current CDC guidelines, may return activity on Sunday  May use muscle relaxant every 8 hours as needed to help with your body aches, may take Tylenol and ibuprofen in addition to this  You have been given an injection of Decadron here in the office to help minimize your headache, ideally things will turn out in about 30 minutes  You may use Zofran every 8 hours as needed for nausea    You can take Tylenol and/or Ibuprofen as needed for fever reduction and pain relief.   For cough: honey 1/2 to 1 teaspoon (you can dilute the honey in water or another fluid).  You can also use guaifenesin and dextromethorphan for cough. You can use a humidifier for chest congestion and cough.  If you don't have a humidifier, you can sit in the bathroom with the hot shower running.      For sore throat: try warm salt water gargles, cepacol lozenges, throat spray, warm tea or water with lemon/honey, popsicles or ice, or OTC cold relief medicine for throat discomfort.   For congestion: take a daily anti-histamine like Zyrtec, Claritin, and a oral decongestant, such as pseudoephedrine.  You can also use Flonase 1-2 sprays in each nostril daily.   It is important to stay hydrated: drink plenty of fluids (water, gatorade/powerade/pedialyte, juices, or teas) to keep your throat moisturized and help further relieve irritation/discomfort.

## 2022-11-27 LAB — SARS CORONAVIRUS 2 (TAT 6-24 HRS): SARS Coronavirus 2: NEGATIVE

## 2022-12-15 ENCOUNTER — Other Ambulatory Visit: Payer: Self-pay | Admitting: Physician Assistant

## 2022-12-20 ENCOUNTER — Other Ambulatory Visit: Payer: Self-pay | Admitting: Physician Assistant

## 2022-12-29 ENCOUNTER — Encounter (HOSPITAL_COMMUNITY): Payer: Self-pay

## 2022-12-29 ENCOUNTER — Ambulatory Visit (HOSPITAL_COMMUNITY)
Admission: EM | Admit: 2022-12-29 | Discharge: 2022-12-29 | Disposition: A | Discharge status: Home / Self Care | Payer: Medicaid Other | Attending: Internal Medicine | Admitting: Internal Medicine

## 2022-12-29 DIAGNOSIS — N3001 Acute cystitis with hematuria: Secondary | ICD-10-CM | POA: Diagnosis present

## 2022-12-29 DIAGNOSIS — R300 Dysuria: Secondary | ICD-10-CM | POA: Diagnosis not present

## 2022-12-29 LAB — POCT URINALYSIS DIP (MANUAL ENTRY)
Bilirubin, UA: NEGATIVE
Blood, UA: NEGATIVE
Glucose, UA: NEGATIVE mg/dL
Ketones, POC UA: NEGATIVE mg/dL
Nitrite, UA: POSITIVE — AB
Protein Ur, POC: NEGATIVE mg/dL
Spec Grav, UA: 1.025 (ref 1.010–1.025)
Urobilinogen, UA: 0.2 E.U./dL
pH, UA: 5.5 (ref 5.0–8.0)

## 2022-12-29 MED ORDER — ONDANSETRON 4 MG PO TBDP: 4.0000 mg | ORAL_TABLET | Freq: Three times a day (TID) | ORAL | 0 refills | Status: DC | PRN

## 2022-12-29 MED ORDER — SULFAMETHOXAZOLE-TRIMETHOPRIM 800-160 MG PO TABS: 1.0000 | ORAL_TABLET | Freq: Two times a day (BID) | ORAL | 0 refills | Status: AC

## 2022-12-29 MED ORDER — FLUCONAZOLE 150 MG PO TABS: 150.0000 mg | ORAL_TABLET | ORAL | 0 refills | Status: AC

## 2022-12-29 NOTE — Discharge Instructions (Signed)
Your urine shows you likely have a urinary tract infection. I have sent your urine for culture to confirm this. We will go ahead and have you start taking antibiotics due to your symptoms.  Take antibiotic as directed.  (Bactrim twice daily for 3 days). Diflucan once today, then again in 3 days for yeast vaginitis associated with antibiotic use.  If you develop diarrhea while taking this medication you may purchase an over-the-counter probiotic or eat yogurt with live active cultures.  To avoid GI upset please take this medication with food. I have sent your urine for culture to see what type of bacteria grows. We will call you if we need to change the treatment plan based on the results of your urine culture.  If you develop any new or worsening symptoms or do not improve in the next 2 to 3 days, please return.  If your symptoms are severe, please go to the emergency room.  Follow-up with your primary care provider for further evaluation and management of your symptoms as well as ongoing wellness visits.  I hope you feel better!

## 2022-12-29 NOTE — ED Provider Notes (Signed)
[facility]    CSN: [id number] Arrival date & time: 12/29/22  1631      History   Chief Complaint Chief Complaint  Patient presents with   Urinary Frequency    HPI Mary Hogan is a 42 y.o. female.   Patient presents to urgent care for evaluation of urinary frequency, dysuria, and bladder pressure for the last 7 days.  Reports right lower quadrant abdominal discomfort as well that radiates to the right lower back.  Also reports intermittent nausea without vomiting.  No diarrhea, dizziness, fever/chills, constipation, blood/mucus in the stools, flank pain, recent antibiotic or steroid use, or use of any over-the-counter medications to help with symptoms before coming to urgent care.  Reports history of frequent urinary tract infections, however cannot remember when her last UTI was.  States she drinks lots of soda, tea, and coffee.  She does not take an SGLT2 inhibitor and is not a diabetic.  She is a current every day cigarette smoker.   Urinary Frequency    Past Medical History:  Diagnosis Date   Abnormal Pap smear    Anxiety    Depression    Enlarged liver    Headache(784.0)    IBS (irritable bowel syndrome)    Scoliosis     Patient Active Problem List   Diagnosis Date Noted   Nausea without vomiting 12/26/2018   Abdominal pain 09/24/2018   Abnormal CT of the abdomen 09/24/2018   Other constipation 09/24/2018   Change in bowel habits 09/24/2018   Anxiety 07/07/2018   Encounter for blood transfusion 07/07/2018   Scoliosis 07/07/2018   Psychotic disorder 01/20/2018   Methamphetamine abuse 10/27/2017   UTI due to extended-spectrum beta lactamase (ESBL) producing Escherichia coli 06/09/2016   Bipolar affective disorder, currently manic, moderate 06/05/2016   URI (upper respiratory infection) 12/23/2013   Attention deficit disorder 06/14/2013   Generalized anxiety disorder 06/14/2013   Essential (primary) hypertension 05/02/2013   SOB (shortness of  breath) 05/02/2013   Chest pain 03/08/2012   MVP (mitral valve prolapse) 03/08/2012   Palpitations 03/08/2012   Hx of migraine headaches 02/19/2012   Polysubstance overdose 12/25/2011   Bipolar disorder current episode depressed 12/06/2011    Class: Acute   IBS (irritable bowel syndrome) 10/13/2011   Epigastric pain 08/27/2011   Chronic pain 03/05/2011   Migraine 03/05/2011   TOBACCO USER 05/26/2009   AMENORRHEA, SECONDARY 01/26/2007   DEPRESSIVE DISORDER, NOS 11/05/2006   RESTLESS LEGS SYNDROME 11/05/2006   PAPANICOLAOU SMEAR, ABNORMAL 11/05/2006   PAPANICOLAOU SMEAR, ABNORMAL 11/05/2006    Past Surgical History:  Procedure Laterality Date   BIOPSY  03/14/2019   Procedure: BIOPSY;  Surgeon: Lemar Lofty., MD;  Location: [facility] ENDOSCOPY;  Service: Gastroenterology;;   BREAST ENHANCEMENT SURGERY  2008   DILATION AND CURETTAGE OF UTERUS     ENDOMETRIAL ABLATION     ESOPHAGOGASTRODUODENOSCOPY (EGD) WITH PROPOFOL N/A 03/14/2019   Procedure: ESOPHAGOGASTRODUODENOSCOPY (EGD) WITH PROPOFOL;  Surgeon: Lemar Lofty., MD;  Location: [facility] ENDOSCOPY;  Service: Gastroenterology;  Laterality: N/A;   EUS N/A 03/14/2019   Procedure: UPPER ENDOSCOPIC ULTRASOUND (EUS) RADIAL;  Surgeon: Lemar Lofty., MD;  Location: [facility] ENDOSCOPY;  Service: Gastroenterology;  Laterality: N/A;   FINE NEEDLE ASPIRATION  03/14/2019   Procedure: FINE NEEDLE ASPIRATION (FNA) LINEAR;  Surgeon: Lemar Lofty., MD;  Location: [facility] ENDOSCOPY;  Service: Gastroenterology;;   LAPAROSCOPIC OVARIAN CYSTECTOMY  2012   TUBAL LIGATION      OB History  Gravida  9   Para  4   Term  2   Preterm  2   AB  5   Living  4      SAB  2   IAB  2   Ectopic  1   Multiple      Live Births               Home Medications    Prior to Admission medications   Medication Sig Start Date End Date Taking? Authorizing Provider  ondansetron (ZOFRAN-ODT) 4 MG disintegrating tablet Take 1 tablet  (4 mg total) by mouth every 8 (eight) hours as needed for nausea or vomiting. 12/29/22  Yes Carlisle Beers, FNP  sulfamethoxazole-trimethoprim (BACTRIM DS) 800-160 MG tablet Take 1 tablet by mouth 2 (two) times daily for 3 days. 12/29/22 01/01/23 Yes Briony Parveen, Donavan Burnet, FNP  clonazePAM (KLONOPIN) 1 MG tablet Take 1 mg by mouth 3 (three) times daily as needed. 10/11/21   [provider]  cloNIDine (CATAPRES) 0.2 MG tablet Take 0.4 mg by mouth at bedtime. 09/11/21   [provider]  cyclobenzaprine (FLEXERIL) 5 MG tablet Take 1 tablet (5 mg total) by mouth 3 (three) times daily as needed for muscle spasms. 11/26/22   White, Elita Boone, NP  dicyclomine (BENTYL) 20 MG tablet TAKE 1 TABLET (20 MG TOTAL) BY MOUTH 2 (TWO) TIMES DAILY AS NEEDED FOR SPASMS. 12/15/22   Unk Lightning, PA  diphenoxylate-atropine (LOMOTIL) 2.5-0.025 MG tablet Take 1 tablet by mouth 4 (four) times daily as needed for diarrhea or loose stools. Patient not taking: Reported on 10/27/2022 10/05/22   Debby Freiberg, NP  fluconazole (DIFLUCAN) 150 MG tablet Take 1 tablet (150 mg total) by mouth every 3 (three) days for 2 doses. 12/29/22 01/02/23 Yes StanhopeDonavan Burnet, FNP  HYDROcodone-acetaminophen (NORCO) 5-325 MG tablet Take 1-2 tablets by mouth every 6 (six) hours as needed. Patient not taking: Reported on 10/27/2022 10/04/22   Geoffery Lyons, MD  magic mouthwash (nystatin, lidocaine, diphenhydrAMINE) suspension Take 5 mLs by mouth 4 (four) times daily as needed for mouth pain. Swish and spit 08/08/22   Raspet, Denny Peon K, PA-C  promethazine (PHENERGAN) 25 MG tablet Take 1 tablet (25 mg total) by mouth every 8 (eight) hours as needed for nausea or vomiting. Patient not taking: Reported on 10/27/2022 10/14/21   Unk Lightning, PA  modafinil (PROVIGIL) 100 MG tablet Take 50-100 mg by mouth See admin instructions. Take 1/2 to 1 tablet every morning and at noon 05/13/18 08/10/19  [provider]     Family History Family History  Problem Relation Age of Onset   Alzheimer's disease Mother    Hypertension Mother    Diabetes Mother    Anxiety disorder Mother    Depression Mother    Post-traumatic stress disorder Mother    Irritable bowel syndrome Mother    Parkinson's disease Mother    Diabetes Father    Liver disease Maternal Grandfather    Pancreatic cancer Maternal Uncle    Stomach cancer Maternal Uncle    Diabetes Maternal Uncle    Colon cancer Maternal Uncle    Esophageal cancer Maternal Uncle    Anesthesia problems Neg Hx    Hypotension Neg Hx    Malignant hyperthermia Neg Hx    Pseudochol deficiency Neg Hx     Social History Social History   Tobacco Use   Smoking status: Every Day    Packs/day: 1    Types:  Cigarettes   Smokeless tobacco: Never  Vaping Use   Vaping Use: Never used  Substance Use Topics   Alcohol use: Yes    Comment: occasional   Drug use: Never     Allergies   Ketorolac tromethamine, Amoxicillin, Penicillins, and Tramadol   Review of Systems Review of Systems  Genitourinary:  Positive for frequency.  Per HPI   Physical Exam Triage Vital Signs ED Triage Vitals [12/29/22 1802]  Enc Vitals Group     BP 131/82     Pulse Rate 84     Resp 14     Temp 98.7 F (37.1 C)     Temp Source Oral     SpO2 97 %     Weight      Height      Head Circumference      Peak Flow      Pain Score 8     Pain Loc      Pain Edu?      Excl. in GC?    No data found.  Updated Vital Signs BP 131/82 (BP Location: Right Arm)   Pulse 84   Temp 98.7 F (37.1 C) (Oral)   Resp 14   SpO2 97%   Visual Acuity Right Eye Distance:   Left Eye Distance:   Bilateral Distance:    Right Eye Near:   Left Eye Near:    Bilateral Near:     Physical Exam Vitals and nursing note reviewed.  Constitutional:      Appearance: She is not ill-appearing or toxic-appearing.  HENT:     Head: Normocephalic and atraumatic.     Right Ear: Hearing and  external ear normal.     Left Ear: Hearing and external ear normal.     Nose: Nose normal.     Mouth/Throat:     Lips: Pink.  Eyes:     General: Lids are normal. Vision grossly intact. Gaze aligned appropriately.     Extraocular Movements: Extraocular movements intact.     Conjunctiva/sclera: Conjunctivae normal.  Pulmonary:     Effort: Pulmonary effort is normal.  Abdominal:     General: Abdomen is flat. Bowel sounds are normal.     Palpations: Abdomen is soft.     Tenderness: There is no abdominal tenderness. There is no right CVA tenderness, left CVA tenderness or guarding.     Comments: No peritoneal signs to abdominal exam.  Musculoskeletal:     Cervical back: Neck supple.     Right lower leg: No edema.     Left lower leg: No edema.  Skin:    General: Skin is warm and dry.     Capillary Refill: Capillary refill takes less than 2 seconds.     Findings: No rash.  Neurological:     General: No focal deficit present.     Mental Status: She is alert and oriented to person, place, and time. Mental status is at baseline.     Cranial Nerves: No dysarthria or facial asymmetry.  Psychiatric:        Mood and Affect: Mood normal.        Speech: Speech normal.        Behavior: Behavior normal.        Thought Content: Thought content normal.        Judgment: Judgment normal.      UC Treatments / Results  Labs (all labs ordered are listed, but only abnormal results are displayed) Labs Reviewed  POCT URINALYSIS DIP (MANUAL ENTRY) - Abnormal; Notable for the following components:      Result Value   Color, UA orange (*)    Clarity, UA hazy (*)    Nitrite, UA Positive (*)    Leukocytes, UA Small (1+) (*)    All other components within normal limits  URINE CULTURE    EKG   Radiology No results found.  Procedures Procedures (including critical care time)  Medications Ordered in UC Medications - No data to display  Initial Impression / Assessment and Plan / UC Course   I have reviewed the triage vital signs and the nursing notes.  Pertinent labs & imaging results that were available during my care of the patient were reviewed by me and considered in my medical decision making (see chart for details).   1.  Dysuria, acute cystitis with hematuria Presentation is consistent with acute uncomplicated cystitis. Patient is nontoxic in appearance with hemodynamically stable vital signs. Low suspicion for acute pyelonephritis. Low suspicion for kidney stone or infected stone.  No concern for pregnancy (history of tubal ablation).   Bactrim sent to pharmacy.  Patient is allergic to penicillins with rash.  Urine culture pending. Patient to push fluids to stay well hydrated and reduce intake of known urinary irritants.  May use Zofran every 8 hours as needed for nausea and vomiting.  Discussed physical exam and available lab work findings in clinic with patient.  Counseled patient regarding appropriate use of medications and potential side effects for all medications recommended or prescribed today. Discussed red flag signs and symptoms of worsening condition,when to call the PCP office, return to urgent care, and when to seek higher level of care in the emergency department. Patient verbalizes understanding and agreement with plan. All questions answered. Patient discharged in stable condition.    Final Clinical Impressions(s) / UC Diagnoses   Final diagnoses:  Dysuria  Acute cystitis with hematuria     Discharge Instructions      Your urine shows you likely have a urinary tract infection. I have sent your urine for culture to confirm this. We will go ahead and have you start taking antibiotics due to your symptoms.  Take antibiotic as directed.  (Bactrim twice daily for 3 days). Diflucan once today, then again in 3 days for yeast vaginitis associated with antibiotic use.  If you develop diarrhea while taking this medication you may purchase an over-the-counter  probiotic or eat yogurt with live active cultures.  To avoid GI upset please take this medication with food. I have sent your urine for culture to see what type of bacteria grows. We will call you if we need to change the treatment plan based on the results of your urine culture.  If you develop any new or worsening symptoms or do not improve in the next 2 to 3 days, please return.  If your symptoms are severe, please go to the emergency room.  Follow-up with your primary care provider for further evaluation and management of your symptoms as well as ongoing wellness visits.  I hope you feel better!    ED Prescriptions     Medication Sig Dispense Auth. Provider   sulfamethoxazole-trimethoprim (BACTRIM DS) 800-160 MG tablet Take 1 tablet by mouth 2 (two) times daily for 3 days. 6 tablet Reita May M, FNP   fluconazole (DIFLUCAN) 150 MG tablet Take 1 tablet (150 mg total) by mouth every 3 (three) days for 2 doses. 2 tablet Carlisle Beers, FNP  ondansetron (ZOFRAN-ODT) 4 MG disintegrating tablet Take 1 tablet (4 mg total) by mouth every 8 (eight) hours as needed for nausea or vomiting. 20 tablet Carlisle Beers, FNP      PDMP not reviewed this encounter.   Carlisle Beers, [place] 12/29/22 Windell Moment

## 2022-12-29 NOTE — ED Triage Notes (Signed)
Patient c/o urinary frequency, right lower back pain that radiates to the RLQ. X 1 week.

## 2022-12-31 LAB — URINE CULTURE: Culture: >=100000 — AB

## 2023-01-02 ENCOUNTER — Other Ambulatory Visit: Payer: Self-pay | Admitting: Physician Assistant

## 2023-01-10 ENCOUNTER — Other Ambulatory Visit: Payer: Self-pay | Admitting: Physician Assistant

## 2023-01-16 ENCOUNTER — Ambulatory Visit
Admission: RE | Admit: 2023-01-16 | Discharge: 2023-01-16 | Disposition: A | Discharge status: Home / Self Care | Payer: Medicaid Other | Source: Ambulatory Visit | Attending: Family Medicine | Admitting: Family Medicine

## 2023-01-16 VITALS — BP 132/89 | HR 84 | Temp 98.3°F | Resp 16

## 2023-01-16 DIAGNOSIS — N390 Urinary tract infection, site not specified: Secondary | ICD-10-CM | POA: Diagnosis not present

## 2023-01-16 DIAGNOSIS — N898 Other specified noninflammatory disorders of vagina: Secondary | ICD-10-CM

## 2023-01-16 LAB — POCT URINALYSIS DIP (MANUAL ENTRY)
Bilirubin, UA: NEGATIVE
Blood, UA: NEGATIVE
Glucose, UA: NEGATIVE mg/dL
Ketones, POC UA: NEGATIVE mg/dL
Leukocytes, UA: NEGATIVE
Nitrite, UA: NEGATIVE
Protein Ur, POC: NEGATIVE mg/dL
Spec Grav, UA: >=1.03 — AB (ref 1.010–1.025)
Urobilinogen, UA: 0.2 E.U./dL
pH, UA: 5.5 (ref 5.0–8.0)

## 2023-01-16 LAB — POCT URINE PREGNANCY: Preg Test, Ur: NEGATIVE

## 2023-01-16 MED ORDER — FLUCONAZOLE 150 MG PO TABS: 150.0000 mg | ORAL_TABLET | Freq: Every day | ORAL | 0 refills | Status: AC

## 2023-01-16 MED ORDER — CIPROFLOXACIN HCL 500 MG PO TABS: 500.0000 mg | ORAL_TABLET | Freq: Two times a day (BID) | ORAL | 0 refills | Status: AC

## 2023-01-16 NOTE — Discharge Instructions (Addendum)
You were seen today for continued abdominal pain with urinary and vaginal symptoms.  Your urine does appear improved from previous.  However, given you symptoms I will treat again for possible UTI x 7 days.  I have sent out diflucan as well.  I would like to wait until the vaginal swab comes back before treatment otherwise.  The nurse will call you if treatment needs to be changed/adjusted.  Follow up here or to the ER if you continue with symptom.

## 2023-01-16 NOTE — ED Provider Notes (Signed)
[facility]    CSN: [id number] Arrival date & time: 01/16/23  1025      History   Chief Complaint Chief Complaint  Patient presents with   Abdominal Pain    My right side has been hurting me and was on a three day antibiotic but it hasn't helped. - Entered by patient    HPI Mary Hogan is a 42 y.o. female.   Patient is here for abd pain.   She was here previously for abdominal pain, dx with UTI and given abx x 3 days.  She felt better while taking them, but then symptoms returned.  She having painful urination, abdominal pain, urinary hesitancy.  Taking AZO to try to help.  Some chills, fatigue.  Some nausea, decreased appetite.  Also with vaginal d/c, slight odor.  No itching.  White d/c.   She has a h/o UTI's and states she usually gets longer treatment.        Past Medical History:  Diagnosis Date   Abnormal Pap smear    Anxiety    Depression    Enlarged liver    Headache(784.0)    IBS (irritable bowel syndrome)    Scoliosis     Patient Active Problem List   Diagnosis Date Noted   Nausea without vomiting 12/26/2018   Abdominal pain 09/24/2018   Abnormal CT of the abdomen 09/24/2018   Other constipation 09/24/2018   Change in bowel habits 09/24/2018   Anxiety 07/07/2018   Encounter for blood transfusion 07/07/2018   Scoliosis 07/07/2018   Psychotic disorder (HCC) 01/20/2018   Methamphetamine abuse (HCC) 10/27/2017   UTI due to extended-spectrum beta lactamase (ESBL) producing Escherichia coli 06/09/2016   Bipolar affective disorder, currently manic, moderate (HCC) 06/05/2016   URI (upper respiratory infection) 12/23/2013   Attention deficit disorder 06/14/2013   Generalized anxiety disorder 06/14/2013   Essential (primary) hypertension 05/02/2013   SOB (shortness of breath) 05/02/2013   Chest pain 03/08/2012   MVP (mitral valve prolapse) 03/08/2012   Palpitations 03/08/2012   Hx of migraine headaches 02/19/2012   Polysubstance  overdose 12/25/2011   Bipolar disorder current episode depressed (HCC) 12/06/2011    Class: Acute   IBS (irritable bowel syndrome) 10/13/2011   Epigastric pain 08/27/2011   Chronic pain 03/05/2011   Migraine 03/05/2011   TOBACCO USER 05/26/2009   AMENORRHEA, SECONDARY 01/26/2007   DEPRESSIVE DISORDER, NOS 11/05/2006   RESTLESS LEGS SYNDROME 11/05/2006   PAPANICOLAOU SMEAR, ABNORMAL 11/05/2006   PAPANICOLAOU SMEAR, ABNORMAL 11/05/2006    Past Surgical History:  Procedure Laterality Date   BIOPSY  03/14/2019   Procedure: BIOPSY;  Surgeon: Lemar Lofty., MD;  Location: [facility] ENDOSCOPY;  Service: Gastroenterology;;   BREAST ENHANCEMENT SURGERY  2008   DILATION AND CURETTAGE OF UTERUS     ENDOMETRIAL ABLATION     ESOPHAGOGASTRODUODENOSCOPY (EGD) WITH PROPOFOL N/A 03/14/2019   Procedure: ESOPHAGOGASTRODUODENOSCOPY (EGD) WITH PROPOFOL;  Surgeon: Lemar Lofty., MD;  Location: [facility] ENDOSCOPY;  Service: Gastroenterology;  Laterality: N/A;   EUS N/A 03/14/2019   Procedure: UPPER ENDOSCOPIC ULTRASOUND (EUS) RADIAL;  Surgeon: Lemar Lofty., MD;  Location: [facility] ENDOSCOPY;  Service: Gastroenterology;  Laterality: N/A;   FINE NEEDLE ASPIRATION  03/14/2019   Procedure: FINE NEEDLE ASPIRATION (FNA) LINEAR;  Surgeon: Lemar Lofty., MD;  Location: [facility] ENDOSCOPY;  Service: Gastroenterology;;   LAPAROSCOPIC OVARIAN CYSTECTOMY  2012   TUBAL LIGATION      OB History     Gravida  9  Para  4   Term  2   Preterm  2   AB  5   Living  4      SAB  2   IAB  2   Ectopic  1   Multiple      Live Births               Home Medications    Prior to Admission medications   Medication Sig Start Date End Date Taking? Authorizing Provider  clonazePAM (KLONOPIN) 1 MG tablet Take 1 mg by mouth 3 (three) times daily as needed. 10/11/21   [provider]  cloNIDine (CATAPRES) 0.2 MG tablet Take 0.4 mg by mouth at bedtime. 09/11/21   [provider]   cyclobenzaprine (FLEXERIL) 5 MG tablet Take 1 tablet (5 mg total) by mouth 3 (three) times daily as needed for muscle spasms. 11/26/22   Valinda Hoar, NP  dicyclomine (BENTYL) 20 MG tablet TAKE 1 TABLET (20 MG TOTAL) BY MOUTH 2 (TWO) TIMES DAILY AS NEEDED FOR SPASMS. 01/12/23   Unk Lightning, PA  diphenoxylate-atropine (LOMOTIL) 2.5-0.025 MG tablet Take 1 tablet by mouth 4 (four) times daily as needed for diarrhea or loose stools. Patient not taking: Reported on 10/27/2022 10/05/22   Debby Freiberg, NP  HYDROcodone-acetaminophen (NORCO) 5-325 MG tablet Take 1-2 tablets by mouth every 6 (six) hours as needed. Patient not taking: Reported on 10/27/2022 10/04/22   Geoffery Lyons, MD  magic mouthwash (nystatin, lidocaine, diphenhydrAMINE) suspension Take 5 mLs by mouth 4 (four) times daily as needed for mouth pain. Swish and spit 08/08/22   Raspet, Janthony Holleman K, PA-C  ondansetron (ZOFRAN-ODT) 4 MG disintegrating tablet Take 1 tablet (4 mg total) by mouth every 8 (eight) hours as needed for nausea or vomiting. 12/29/22   Carlisle Beers, FNP  promethazine (PHENERGAN) 25 MG tablet TAKE 1 TABLET BY MOUTH EVERY 8 HOURS AS NEEDED FOR NAUSEA OR VOMITING. 01/02/23   Unk Lightning, PA  modafinil (PROVIGIL) 100 MG tablet Take 50-100 mg by mouth See admin instructions. Take 1/2 to 1 tablet every morning and at noon 05/13/18 08/10/19  [provider]    Family History Family History  Problem Relation Age of Onset   Alzheimer's disease Mother    Hypertension Mother    Diabetes Mother    Anxiety disorder Mother    Depression Mother    Post-traumatic stress disorder Mother    Irritable bowel syndrome Mother    Parkinson's disease Mother    Diabetes Father    Liver disease Maternal Grandfather    Pancreatic cancer Maternal Uncle    Stomach cancer Maternal Uncle    Diabetes Maternal Uncle    Colon cancer Maternal Uncle    Esophageal cancer Maternal Uncle    Anesthesia problems Neg  Hx    Hypotension Neg Hx    Malignant hyperthermia Neg Hx    Pseudochol deficiency Neg Hx     Social History Social History   Tobacco Use   Smoking status: Every Day    Packs/day: 1    Types: Cigarettes   Smokeless tobacco: Never  Vaping Use   Vaping Use: Never used  Substance Use Topics   Alcohol use: Yes    Comment: occasional   Drug use: Never     Allergies   Ketorolac tromethamine, Amoxicillin, Penicillins, and Tramadol   Review of Systems Review of Systems  Constitutional:  Positive for fatigue.  HENT: Negative.    Respiratory: Negative.  Cardiovascular: Negative.   Gastrointestinal:  Positive for abdominal pain and nausea. Negative for vomiting.  Genitourinary:  Positive for dysuria, frequency and vaginal discharge.  Psychiatric/Behavioral: Negative.       Physical Exam Triage Vital Signs ED Triage Vitals  Enc Vitals Group     BP 01/16/23 1041 132/89     Pulse Rate 01/16/23 1041 84     Resp 01/16/23 1041 16     Temp 01/16/23 1041 98.3 F (36.8 C)     Temp Source 01/16/23 1041 Oral     SpO2 01/16/23 1041 96 %     Weight --      Height --      Head Circumference --      Peak Flow --      Pain Score 01/16/23 1049 10     Pain Loc --      Pain Edu? --      Excl. in GC? --    No data found.  Updated Vital Signs BP 132/89 (BP Location: Left Arm)   Pulse 84   Temp 98.3 F (36.8 C) (Oral)   Resp 16   SpO2 96%   Visual Acuity Right Eye Distance:   Left Eye Distance:   Bilateral Distance:    Right Eye Near:   Left Eye Near:    Bilateral Near:     Physical Exam Constitutional:      Appearance: She is well-developed.  Cardiovascular:     Rate and Rhythm: Normal rate and regular rhythm.  Pulmonary:     Effort: Pulmonary effort is normal.     Breath sounds: Normal breath sounds.  Abdominal:     General: Abdomen is flat. Bowel sounds are normal.     Palpations: Abdomen is soft.     Tenderness: There is abdominal tenderness in the right  lower quadrant and suprapubic area. There is right CVA tenderness.  Skin:    General: Skin is warm.  Neurological:     Mental Status: She is alert.      UC Treatments / Results  Labs (all labs ordered are listed, but only abnormal results are displayed) Labs Reviewed  POCT URINALYSIS DIP (MANUAL ENTRY) - Abnormal; Notable for the following components:      Result Value   Spec Grav, UA >=1.030 (*)    All other components within normal limits  URINE CULTURE  POCT URINE PREGNANCY  CERVICOVAGINAL ANCILLARY ONLY    EKG   Radiology No results found.  Procedures Procedures (including critical care time)  Medications Ordered in UC Medications - No data to display  Initial Impression / Assessment and Plan / UC Course  I have reviewed the triage vital signs and the nursing notes.  Pertinent labs & imaging results that were available during my care of the patient were reviewed by me and considered in my medical decision making (see chart for details).   Final Clinical Impressions(s) / UC Diagnoses   Final diagnoses:  Urinary tract infection without hematuria, site unspecified  Vaginal discharge     Discharge Instructions      You were seen today for continued abdominal pain with urinary and vaginal symptoms.  Your urine does appear improved from previous.  However, given you symptoms I will treat again for possible UTI x 7 days.  I have sent out diflucan as well.  I would like to wait until the vaginal swab comes back before treatment otherwise.  The nurse will call you if treatment needs to  be changed/adjusted.  Follow up here or to the ER if you continue with symptom.     ED Prescriptions     Medication Sig Dispense Auth. Provider   fluconazole (DIFLUCAN) 150 MG tablet Take 1 tablet (150 mg total) by mouth daily for 1 day. 1 tablet Remie Mathison, MD   ciprofloxacin (CIPRO) 500 MG tablet Take 1 tablet (500 mg total) by mouth 2 (two) times daily for 7 days. 14  tablet Jannifer Franklin, MD      PDMP not reviewed this encounter.   Jannifer Franklin, MD 01/16/23 1114

## 2023-01-16 NOTE — ED Triage Notes (Signed)
Pt reports right sided low back pain, right lower quadrant abdominal pain and white vaginal discharge x 1 week. Reports she finished antibiotic for UTI 1 week ago.

## 2023-01-17 LAB — URINE CULTURE: Culture: NO GROWTH

## 2023-01-19 LAB — CERVICOVAGINAL ANCILLARY ONLY
Bacterial Vaginitis (gardnerella): POSITIVE — AB
Candida Glabrata: NEGATIVE
Candida Vaginitis: NEGATIVE
Chlamydia: NEGATIVE
Comment: NEGATIVE
Comment: NEGATIVE
Comment: NEGATIVE
Comment: NEGATIVE
Comment: NEGATIVE
Comment: NORMAL
Neisseria Gonorrhea: NEGATIVE
Trichomonas: NEGATIVE

## 2023-01-20 ENCOUNTER — Other Ambulatory Visit: Payer: Self-pay | Admitting: Gastroenterology

## 2023-01-20 ENCOUNTER — Telehealth (HOSPITAL_COMMUNITY): Payer: Self-pay | Admitting: Emergency Medicine

## 2023-01-20 MED ORDER — METRONIDAZOLE 500 MG PO TABS: 500.0000 mg | ORAL_TABLET | Freq: Two times a day (BID) | ORAL | 0 refills | Status: DC

## 2023-02-06 ENCOUNTER — Other Ambulatory Visit: Payer: Self-pay | Admitting: Physician Assistant

## 2023-02-06 ENCOUNTER — Other Ambulatory Visit: Payer: Self-pay | Admitting: Gastroenterology

## 2023-03-02 ENCOUNTER — Ambulatory Visit
Admission: RE | Admit: 2023-03-02 | Discharge: 2023-03-02 | Disposition: A | Discharge status: Home / Self Care | Payer: Medicaid Other | Source: Ambulatory Visit | Attending: Physician Assistant | Admitting: Physician Assistant

## 2023-03-02 VITALS — BP 128/78 | HR 111 | Temp 98.8°F | Resp 18

## 2023-03-02 DIAGNOSIS — B9689 Other specified bacterial agents as the cause of diseases classified elsewhere: Secondary | ICD-10-CM | POA: Diagnosis present

## 2023-03-02 DIAGNOSIS — N760 Acute vaginitis: Secondary | ICD-10-CM | POA: Insufficient documentation

## 2023-03-02 LAB — POCT URINALYSIS DIP (MANUAL ENTRY)
Bilirubin, UA: NEGATIVE
Glucose, UA: NEGATIVE mg/dL
Ketones, POC UA: NEGATIVE mg/dL
Nitrite, UA: NEGATIVE
Spec Grav, UA: >=1.03 — AB (ref 1.010–1.025)
Urobilinogen, UA: 0.2 E.U./dL
pH, UA: 6 (ref 5.0–8.0)

## 2023-03-02 LAB — POCT URINE PREGNANCY: Preg Test, Ur: NEGATIVE

## 2023-03-02 MED ORDER — METRONIDAZOLE 500 MG PO TABS: 500.0000 mg | ORAL_TABLET | Freq: Two times a day (BID) | ORAL | 0 refills | Status: DC

## 2023-03-02 NOTE — Discharge Instructions (Signed)
Return if any problems.

## 2023-03-02 NOTE — ED Triage Notes (Signed)
C/O intermittent RLQ pain x approx 1 wk. C/O some nausea and fatigue, but denies vomiting or diarrhea. No known fevers, but c/o chills. C/O malodorous vaginal discharge onset approx 3 days ago.

## 2023-03-03 LAB — CERVICOVAGINAL ANCILLARY ONLY
Bacterial Vaginitis (gardnerella): POSITIVE — AB
Candida Glabrata: NEGATIVE
Candida Vaginitis: NEGATIVE
Chlamydia: NEGATIVE
Comment: NEGATIVE
Comment: NEGATIVE
Comment: NEGATIVE
Comment: NEGATIVE
Comment: NEGATIVE
Comment: NORMAL
Neisseria Gonorrhea: NEGATIVE
Trichomonas: NEGATIVE

## 2023-03-04 NOTE — ED Provider Notes (Signed)
[facility]    CSN: [id number] Arrival date & time: 03/02/23  1457      History   Chief Complaint Chief Complaint  Patient presents with   Abdominal Pain   Appt 1500    HPI Mary Hogan is a 42 y.o. female.   Patient reports that she has had a vaginal discharge.  She has past medical history of BV and feels like she has the same again.  Patient also reports having some lower abdominal discomfort for the past week.  She has not had any fever or chills patient reports she has had some diarrhea  The history is provided by the patient.  Abdominal Pain   Past Medical History:  Diagnosis Date   Abnormal Pap smear    Anxiety    Depression    Enlarged liver    Headache(784.0)    IBS (irritable bowel syndrome)    Scoliosis     Patient Active Problem List   Diagnosis Date Noted   Nausea without vomiting 12/26/2018   Abdominal pain 09/24/2018   Abnormal CT of the abdomen 09/24/2018   Other constipation 09/24/2018   Change in bowel habits 09/24/2018   Anxiety 07/07/2018   Encounter for blood transfusion 07/07/2018   Scoliosis 07/07/2018   Psychotic disorder (HCC) 01/20/2018   Methamphetamine abuse (HCC) 10/27/2017   UTI due to extended-spectrum beta lactamase (ESBL) producing Escherichia coli 06/09/2016   Bipolar affective disorder, currently manic, moderate (HCC) 06/05/2016   URI (upper respiratory infection) 12/23/2013   Attention deficit disorder 06/14/2013   Generalized anxiety disorder 06/14/2013   Essential (primary) hypertension 05/02/2013   SOB (shortness of breath) 05/02/2013   Chest pain 03/08/2012   MVP (mitral valve prolapse) 03/08/2012   Palpitations 03/08/2012   Hx of migraine headaches 02/19/2012   Polysubstance overdose 12/25/2011   Bipolar disorder current episode depressed (HCC) 12/06/2011    Class: Acute   IBS (irritable bowel syndrome) 10/13/2011   Epigastric pain 08/27/2011   Chronic pain 03/05/2011   Migraine 03/05/2011    TOBACCO USER 05/26/2009   AMENORRHEA, SECONDARY 01/26/2007   DEPRESSIVE DISORDER, NOS 11/05/2006   RESTLESS LEGS SYNDROME 11/05/2006   PAPANICOLAOU SMEAR, ABNORMAL 11/05/2006   PAPANICOLAOU SMEAR, ABNORMAL 11/05/2006    Past Surgical History:  Procedure Laterality Date   BIOPSY  03/14/2019   Procedure: BIOPSY;  Surgeon: Lemar Lofty., MD;  Location: [facility] ENDOSCOPY;  Service: Gastroenterology;;   BREAST ENHANCEMENT SURGERY  2008   DILATION AND CURETTAGE OF UTERUS     ENDOMETRIAL ABLATION     ESOPHAGOGASTRODUODENOSCOPY (EGD) WITH PROPOFOL N/A 03/14/2019   Procedure: ESOPHAGOGASTRODUODENOSCOPY (EGD) WITH PROPOFOL;  Surgeon: Lemar Lofty., MD;  Location: [facility] ENDOSCOPY;  Service: Gastroenterology;  Laterality: N/A;   EUS N/A 03/14/2019   Procedure: UPPER ENDOSCOPIC ULTRASOUND (EUS) RADIAL;  Surgeon: Lemar Lofty., MD;  Location: [facility] ENDOSCOPY;  Service: Gastroenterology;  Laterality: N/A;   FINE NEEDLE ASPIRATION  03/14/2019   Procedure: FINE NEEDLE ASPIRATION (FNA) LINEAR;  Surgeon: Lemar Lofty., MD;  Location: [facility] ENDOSCOPY;  Service: Gastroenterology;;   LAPAROSCOPIC OVARIAN CYSTECTOMY  2012   TUBAL LIGATION      OB History     Gravida  9   Para  4   Term  2   Preterm  2   AB  5   Living  4      SAB  2   IAB  2   Ectopic  1   Multiple  Live Births               Home Medications    Prior to Admission medications   Medication Sig Start Date End Date Taking? Authorizing Provider  clonazePAM (KLONOPIN) 1 MG tablet Take 1 mg by mouth 3 (three) times daily as needed. 10/11/21  Yes [provider]  cloNIDine (CATAPRES) 0.2 MG tablet Take 0.4 mg by mouth at bedtime. 09/11/21  Yes [provider]  dicyclomine (BENTYL) 20 MG tablet TAKE 1 TABLET (20 MG TOTAL) BY MOUTH 2 (TWO) TIMES DAILY AS NEEDED FOR SPASMS. 02/06/23  Yes Unk Lightning, PA  omeprazole (PRILOSEC) 40 MG capsule TAKE 1 CAPSULE (40 MG  TOTAL) BY MOUTH DAILY. 02/06/23  Yes Quentin Mulling R, PA-C  metroNIDAZOLE (FLAGYL) 500 MG tablet Take 1 tablet (500 mg total) by mouth 2 (two) times daily. 03/02/23   Elson Areas, PA-C  modafinil (PROVIGIL) 100 MG tablet Take 50-100 mg by mouth See admin instructions. Take 1/2 to 1 tablet every morning and at noon 05/13/18 08/10/19  [provider]    Family History Family History  Problem Relation Age of Onset   Alzheimer's disease Mother    Hypertension Mother    Diabetes Mother    Anxiety disorder Mother    Depression Mother    Post-traumatic stress disorder Mother    Irritable bowel syndrome Mother    Parkinson's disease Mother    Diabetes Father    Liver disease Maternal Grandfather    Pancreatic cancer Maternal Uncle    Stomach cancer Maternal Uncle    Diabetes Maternal Uncle    Colon cancer Maternal Uncle    Esophageal cancer Maternal Uncle    Anesthesia problems Neg Hx    Hypotension Neg Hx    Malignant hyperthermia Neg Hx    Pseudochol deficiency Neg Hx     Social History Social History   Tobacco Use   Smoking status: Every Day    Packs/day: 1    Types: Cigarettes   Smokeless tobacco: Never  Vaping Use   Vaping Use: Never used  Substance Use Topics   Alcohol use: Not Currently    Comment: rare   Drug use: Never     Allergies   Ketorolac tromethamine, Amoxicillin, Penicillins, and Tramadol   Review of Systems Review of Systems  Gastrointestinal:  Positive for abdominal pain.  All other systems reviewed and are negative.    Physical Exam Triage Vital Signs ED Triage Vitals  Enc Vitals Group     BP 03/02/23 1517 128/78     Pulse Rate 03/02/23 1517 (!) 111     Resp 03/02/23 1517 18     Temp 03/02/23 1517 98.8 F (37.1 C)     Temp Source 03/02/23 1517 Oral     SpO2 03/02/23 1517 93 %     Weight --      Height --      Head Circumference --      Peak Flow --      Pain Score 03/02/23 1519 6     Pain Loc --      Pain Edu? --       Excl. in GC? --    No data found.  Updated Vital Signs BP 128/78   Pulse (!) 111   Temp 98.8 F (37.1 C) (Oral)   Resp 18   SpO2 93%   Visual Acuity Right Eye Distance:   Left Eye Distance:   Bilateral Distance:  Right Eye Near:   Left Eye Near:    Bilateral Near:     Physical Exam Vitals and nursing note reviewed.  Constitutional:      Appearance: She is well-developed.  HENT:     Head: Normocephalic.  Cardiovascular:     Rate and Rhythm: Normal rate.  Pulmonary:     Effort: Pulmonary effort is normal.  Abdominal:     General: Bowel sounds are normal. There is no distension.     Palpations: Abdomen is soft.  Musculoskeletal:        General: Normal range of motion.     Cervical back: Normal range of motion.  Skin:    General: Skin is warm.  Neurological:     General: No focal deficit present.     Mental Status: She is alert and oriented to person, place, and time.      UC Treatments / Results  Labs (all labs ordered are listed, but only abnormal results are displayed) Labs Reviewed  POCT URINALYSIS DIP (MANUAL ENTRY) - Abnormal; Notable for the following components:      Result Value   Clarity, UA hazy (*)    Spec Grav, UA >=1.030 (*)    Blood, UA trace-intact (*)    Protein Ur, POC trace (*)    Leukocytes, UA Trace (*)    All other components within normal limits  CERVICOVAGINAL ANCILLARY ONLY - Abnormal; Notable for the following components:   Bacterial Vaginitis (gardnerella) Positive (*)    All other components within normal limits  POCT URINE PREGNANCY    EKG   Radiology No results found.  Procedures Procedures (including critical care time)  Medications Ordered in UC Medications - No data to display  Initial Impression / Assessment and Plan / UC Course  I have reviewed the triage vital signs and the nursing notes.  Pertinent labs & imaging results that were available during my care of the patient were reviewed by me and considered  in my medical decision making (see chart for details).     Patient given a prescription for Flagyl she is advised to go to the emergency department if increasing lower abdominal pain or if any symptoms of dehydration Final Clinical Impressions(s) / UC Diagnoses   Final diagnoses:  BV (bacterial vaginosis)     Discharge Instructions      Return if any problems.     ED Prescriptions     Medication Sig Dispense Auth. Provider   metroNIDAZOLE (FLAGYL) 500 MG tablet Take 1 tablet (500 mg total) by mouth 2 (two) times daily. 14 tablet Elson Areas, [place]      PDMP not reviewed this encounter. An After Visit Summary was printed and given to the patient.       Elson Areas, [place] 03/04/23 [phone]

## 2023-04-30 ENCOUNTER — Ambulatory Visit (HOSPITAL_COMMUNITY)
Admission: EM | Admit: 2023-04-30 | Discharge: 2023-04-30 | Disposition: A | Discharge status: Home / Self Care | Payer: MEDICAID | Attending: Internal Medicine | Admitting: Internal Medicine

## 2023-04-30 ENCOUNTER — Ambulatory Visit: Payer: Self-pay

## 2023-04-30 ENCOUNTER — Encounter (HOSPITAL_COMMUNITY): Payer: Self-pay | Admitting: Emergency Medicine

## 2023-04-30 DIAGNOSIS — R112 Nausea with vomiting, unspecified: Secondary | ICD-10-CM | POA: Diagnosis present

## 2023-04-30 DIAGNOSIS — R197 Diarrhea, unspecified: Secondary | ICD-10-CM | POA: Diagnosis present

## 2023-04-30 DIAGNOSIS — B349 Viral infection, unspecified: Secondary | ICD-10-CM | POA: Diagnosis present

## 2023-04-30 DIAGNOSIS — Z1152 Encounter for screening for COVID-19: Secondary | ICD-10-CM | POA: Diagnosis not present

## 2023-04-30 MED ORDER — ONDANSETRON 4 MG PO TBDP
4.0000 mg | ORAL_TABLET | Freq: Once | ORAL | Status: AC
  Administered 2023-04-30: 4 mg via ORAL

## 2023-04-30 MED ORDER — ONDANSETRON 4 MG PO TBDP
ORAL_TABLET | ORAL | Status: AC
  Filled 2023-04-30: qty 1

## 2023-04-30 MED ORDER — ACETAMINOPHEN 325 MG PO TABS
ORAL_TABLET | ORAL | Status: AC
  Filled 2023-04-30: qty 3

## 2023-04-30 MED ORDER — ONDANSETRON 4 MG PO TBDP: 4.0000 mg | ORAL_TABLET | Freq: Three times a day (TID) | ORAL | 0 refills | Status: DC | PRN

## 2023-04-30 MED ORDER — ACETAMINOPHEN 325 MG PO TABS
975.0000 mg | ORAL_TABLET | Freq: Once | ORAL | Status: AC
  Administered 2023-04-30: 975 mg via ORAL

## 2023-04-30 NOTE — ED Triage Notes (Signed)
Pt reports migraine headache that started yesterday while at work. Today having body aches, nausea and little diarrhea as well as headache. Took Ibuprofen.  Grand daughter has covid.

## 2023-04-30 NOTE — ED Provider Notes (Addendum)
[facility]    CSN: [id number] Arrival date & time: 04/30/23  1700      History   Chief Complaint Chief Complaint  Patient presents with   Headache   Generalized Body Aches   Nausea    HPI Mary Hogan is a 42 y.o. female.   Patient presents to urgent care for evaluation of headache, chills, nausea, and vomiting that started yesterday while she was at work (April 29, 2023). Headache to the frontal aspect of the forehead currently 7/10. No vision changes, dizziness. Reports photophobia without phonophobia, no history of migraines. She has had a couple of episodes of nausea/vomiting today, currently nauseous. She has also had a couple of episodes of diarrhea without blood/mucous in the stools. Denies cough, congestion, sore throat, or rash. No shortness of breath, chest pain, or palpitations. No abdominal pain, urinary symptoms, or flank pain.  No documented fever at home but reports hot/cold chills. Treating symptoms with ibuprofen at home. Took 400mg  ibuprofen 2 hours ago. Exposed to granddaughter who has COVID-19.   Headache   Past Medical History:  Diagnosis Date   Abnormal Pap smear    Anxiety    Depression    Enlarged liver    Headache(784.0)    IBS (irritable bowel syndrome)    Scoliosis     Patient Active Problem List   Diagnosis Date Noted   Nausea without vomiting 12/26/2018   Abdominal pain 09/24/2018   Abnormal CT of the abdomen 09/24/2018   Other constipation 09/24/2018   Change in bowel habits 09/24/2018   Anxiety 07/07/2018   Encounter for blood transfusion 07/07/2018   Scoliosis 07/07/2018   Psychotic disorder (HCC) 01/20/2018   Methamphetamine abuse (HCC) 10/27/2017   UTI due to extended-spectrum beta lactamase (ESBL) producing Escherichia coli 06/09/2016   Bipolar affective disorder, currently manic, moderate (HCC) 06/05/2016   URI (upper respiratory infection) 12/23/2013   Attention deficit disorder 06/14/2013   Generalized  anxiety disorder 06/14/2013   Essential (primary) hypertension 05/02/2013   SOB (shortness of breath) 05/02/2013   Chest pain 03/08/2012   MVP (mitral valve prolapse) 03/08/2012   Palpitations 03/08/2012   Hx of migraine headaches 02/19/2012   Polysubstance overdose 12/25/2011   Bipolar disorder current episode depressed (HCC) 12/06/2011    Class: Acute   IBS (irritable bowel syndrome) 10/13/2011   Epigastric pain 08/27/2011   Chronic pain 03/05/2011   Migraine 03/05/2011   TOBACCO USER 05/26/2009   AMENORRHEA, SECONDARY 01/26/2007   DEPRESSIVE DISORDER, NOS 11/05/2006   RESTLESS LEGS SYNDROME 11/05/2006   PAPANICOLAOU SMEAR, ABNORMAL 11/05/2006   PAPANICOLAOU SMEAR, ABNORMAL 11/05/2006    Past Surgical History:  Procedure Laterality Date   BIOPSY  03/14/2019   Procedure: BIOPSY;  Surgeon: Lemar Lofty., MD;  Location: [facility] ENDOSCOPY;  Service: Gastroenterology;;   BREAST ENHANCEMENT SURGERY  2008   DILATION AND CURETTAGE OF UTERUS     ENDOMETRIAL ABLATION     ESOPHAGOGASTRODUODENOSCOPY (EGD) WITH PROPOFOL N/A 03/14/2019   Procedure: ESOPHAGOGASTRODUODENOSCOPY (EGD) WITH PROPOFOL;  Surgeon: Lemar Lofty., MD;  Location: [facility] ENDOSCOPY;  Service: Gastroenterology;  Laterality: N/A;   EUS N/A 03/14/2019   Procedure: UPPER ENDOSCOPIC ULTRASOUND (EUS) RADIAL;  Surgeon: Lemar Lofty., MD;  Location: [facility] ENDOSCOPY;  Service: Gastroenterology;  Laterality: N/A;   FINE NEEDLE ASPIRATION  03/14/2019   Procedure: FINE NEEDLE ASPIRATION (FNA) LINEAR;  Surgeon: Lemar Lofty., MD;  Location: [facility] ENDOSCOPY;  Service: Gastroenterology;;   LAPAROSCOPIC OVARIAN CYSTECTOMY  2012   TUBAL  LIGATION      OB History     Gravida  9   Para  4   Term  2   Preterm  2   AB  5   Living  4      SAB  2   IAB  2   Ectopic  1   Multiple      Live Births               Home Medications    Prior to Admission medications   Medication Sig Start  Date End Date Taking? Authorizing Provider  ondansetron (ZOFRAN-ODT) 4 MG disintegrating tablet Take 1 tablet (4 mg total) by mouth every 8 (eight) hours as needed for nausea or vomiting. 04/30/23  Yes Jacori Mulrooney, Donavan Burnet, FNP  clonazePAM (KLONOPIN) 1 MG tablet Take 1 mg by mouth 3 (three) times daily as needed. 10/11/21   [provider]  cloNIDine (CATAPRES) 0.2 MG tablet Take 0.4 mg by mouth at bedtime. 09/11/21   [provider]  dicyclomine (BENTYL) 20 MG tablet TAKE 1 TABLET (20 MG TOTAL) BY MOUTH 2 (TWO) TIMES DAILY AS NEEDED FOR SPASMS. 02/06/23   Unk Lightning, PA  metroNIDAZOLE (FLAGYL) 500 MG tablet Take 1 tablet (500 mg total) by mouth 2 (two) times daily. 03/02/23   Elson Areas, PA-C  omeprazole (PRILOSEC) 40 MG capsule TAKE 1 CAPSULE (40 MG TOTAL) BY MOUTH DAILY. 02/06/23   Doree Albee, PA-C  modafinil (PROVIGIL) 100 MG tablet Take 50-100 mg by mouth See admin instructions. Take 1/2 to 1 tablet every morning and at noon 05/13/18 08/10/19  [provider]    Family History Family History  Problem Relation Age of Onset   Alzheimer's disease Mother    Hypertension Mother    Diabetes Mother    Anxiety disorder Mother    Depression Mother    Post-traumatic stress disorder Mother    Irritable bowel syndrome Mother    Parkinson's disease Mother    Diabetes Father    Liver disease Maternal Grandfather    Pancreatic cancer Maternal Uncle    Stomach cancer Maternal Uncle    Diabetes Maternal Uncle    Colon cancer Maternal Uncle    Esophageal cancer Maternal Uncle    Anesthesia problems Neg Hx    Hypotension Neg Hx    Malignant hyperthermia Neg Hx    Pseudochol deficiency Neg Hx     Social History Social History   Tobacco Use   Smoking status: Every Day    Current packs/day: 1.00    Types: Cigarettes   Smokeless tobacco: Never  Vaping Use   Vaping status: Never Used  Substance Use Topics   Alcohol use: Not Currently    Comment:  rare   Drug use: Never     Allergies   Ketorolac tromethamine, Amoxicillin, Penicillins, and Tramadol   Review of Systems Review of Systems  Neurological:  Positive for headaches.  Per HPI   Physical Exam Triage Vital Signs ED Triage Vitals  Encounter Vitals Group     BP 04/30/23 1711 131/84     Systolic BP Percentile --      Diastolic BP Percentile --      Pulse Rate 04/30/23 1711 (!) 126     Resp 04/30/23 1711 15     Temp 04/30/23 1711 98.6 F (37 C)     Temp Source 04/30/23 1711 Oral     SpO2 04/30/23 1711 99 %  Weight --      Height --      Head Circumference --      Peak Flow --      Pain Score 04/30/23 1710 7     Pain Loc --      Pain Education --      Exclude from Growth Chart --    No data found.  Updated Vital Signs BP 131/84 (BP Location: Right Arm)   Pulse (!) 126   Temp 98.6 F (37 C) (Oral)   Resp 15   SpO2 99%   Visual Acuity Right Eye Distance:   Left Eye Distance:   Bilateral Distance:    Right Eye Near:   Left Eye Near:    Bilateral Near:     Physical Exam Vitals and nursing note reviewed.  Constitutional:      Appearance: She is not ill-appearing or toxic-appearing.  HENT:     Head: Normocephalic and atraumatic.     Right Ear: Hearing and external ear normal.     Left Ear: Hearing and external ear normal.     Nose: Nose normal.     Mouth/Throat:     Lips: Pink.  Eyes:     General: Lids are normal. Vision grossly intact. Gaze aligned appropriately.     Extraocular Movements: Extraocular movements intact.     Conjunctiva/sclera: Conjunctivae normal.  Cardiovascular:     Rate and Rhythm: Regular rhythm. Tachycardia present.     Heart sounds: Normal heart sounds, S1 normal and S2 normal.     Comments: Tachycardia, regular rhythm.  Pulmonary:     Effort: Pulmonary effort is normal. No respiratory distress.     Breath sounds: Normal breath sounds and air entry. No stridor. No wheezing, rhonchi or rales.  Chest:     Chest  wall: No tenderness.  Abdominal:     General: Bowel sounds are normal.     Palpations: Abdomen is soft.     Tenderness: There is no abdominal tenderness. There is no right CVA tenderness, left CVA tenderness or guarding.  Musculoskeletal:     Cervical back: Neck supple.  Skin:    General: Skin is warm and dry.     Capillary Refill: Capillary refill takes less than 2 seconds.     Findings: No rash.  Neurological:     General: No focal deficit present.     Mental Status: She is alert and oriented to person, place, and time. Mental status is at baseline.     Cranial Nerves: No dysarthria or facial asymmetry.  Psychiatric:        Mood and Affect: Mood normal.        Speech: Speech normal.        Behavior: Behavior normal.        Thought Content: Thought content normal.        Judgment: Judgment normal.      UC Treatments / Results  Labs (all labs ordered are listed, but only abnormal results are displayed) Labs Reviewed  SARS CORONAVIRUS 2 (TAT 6-24 HRS)    EKG   Radiology No results found.  Procedures Procedures (including critical care time)  Medications Ordered in UC Medications  ondansetron (ZOFRAN-ODT) disintegrating tablet 4 mg (4 mg Oral Given 04/30/23 1743)  acetaminophen (TYLENOL) tablet 975 mg (975 mg Oral Given 04/30/23 1742)    Initial Impression / Assessment and Plan / UC Course  I have reviewed the triage vital signs and the nursing notes.  Pertinent  labs & imaging results that were available during my care of the patient were reviewed by me and considered in my medical decision making (see chart for details).   1. Viral illness, nausea vomiting and diarrhea Suspicion for COVID-19 given exposure. Testing pending, will call if positive, discussed CDC guidelines for masking etc.  Will treat in clinic with zofran and tylenol for nausea/headache.  Headache likely secondary to viral process. Vitals stable, lungs clear. Tachycardic at 110 on repeat vital  signs. Encouraged to increase fluids to stay well hydrated.  Sip on liquids, then increase diet to solids as tolerated with bland foods. Zofran, ibuprofen, and tylenol as needed for symptomatic relief.  Work note given.  Counseled patient on potential for adverse effects with medications prescribed/recommended today, strict ER and return-to-clinic precautions discussed, patient verbalized understanding.    Final Clinical Impressions(s) / UC Diagnoses   Final diagnoses:  Viral illness  Nausea vomiting and diarrhea     Discharge Instructions      Your evaluation suggests that your symptoms are most likely due to viral illness.  COVID testing is pending, staff will call you if this is positive. Wear a mask for 5 days of symptoms while you are in public, then you may remove your mask. You may go back to work if you do not have a fever for 24 hours without any medicines.  Take zofran to help with nausea every 8 hours as needed. Tylenol as needed or ibuprofen as needed for headache. You may use over the counter medicines for aches and pains such as tylenol as needed.  Start sipping on liquids (broth, water, gatorade, etc). If you are able to keep liquids down without vomiting for 1-2 hours, you may eat bland foods like jello, pudding, applesauce, bananas, rice, and white toast. Once you can tolerate blands, you may return to normal diet.   Pedialyte or gatorolyte may help to prevent/fix dehydration due to vomiting and diarrhea.  Please follow up with your primary care provider for further management. Return if you experience worsening or uncontrolled pain, inability to tolerate fluids by mouth, difficulty breathing, fevers 100.28F or greater, recurrent vomiting, or any other concerning symptoms.      ED Prescriptions     Medication Sig Dispense Auth. Provider   ondansetron (ZOFRAN-ODT) 4 MG disintegrating tablet Take 1 tablet (4 mg total) by mouth every 8 (eight) hours as needed  for nausea or vomiting. 20 tablet Carlisle Beers, FNP      PDMP not reviewed this encounter.   Carlisle Beers, FNP 04/30/23 1753    Carlisle Beers, FNP 04/30/23 1754

## 2023-04-30 NOTE — Discharge Instructions (Signed)
Your evaluation suggests that your symptoms are most likely due to viral illness.  COVID testing is pending, staff will call you if this is positive. Wear a mask for 5 days of symptoms while you are in public, then you may remove your mask. You may go back to work if you do not have a fever for 24 hours without any medicines.  Take zofran to help with nausea every 8 hours as needed. Tylenol as needed or ibuprofen as needed for headache. You may use over the counter medicines for aches and pains such as tylenol as needed.  Start sipping on liquids (broth, water, gatorade, etc). If you are able to keep liquids down without vomiting for 1-2 hours, you may eat bland foods like jello, pudding, applesauce, bananas, rice, and white toast. Once you can tolerate blands, you may return to normal diet.   Pedialyte or gatorolyte may help to prevent/fix dehydration due to vomiting and diarrhea.  Please follow up with your primary care provider for further management. Return if you experience worsening or uncontrolled pain, inability to tolerate fluids by mouth, difficulty breathing, fevers 100.69F or greater, recurrent vomiting, or any other concerning symptoms.

## 2023-05-01 LAB — SARS CORONAVIRUS 2 (TAT 6-24 HRS): SARS Coronavirus 2: NEGATIVE

## 2023-08-10 ENCOUNTER — Ambulatory Visit (HOSPITAL_COMMUNITY): Payer: Self-pay

## 2023-12-25 ENCOUNTER — Encounter (HOSPITAL_COMMUNITY): Payer: Self-pay | Admitting: Emergency Medicine

## 2023-12-25 ENCOUNTER — Ambulatory Visit (HOSPITAL_COMMUNITY)
Admission: EM | Admit: 2023-12-25 | Discharge: 2023-12-25 | Disposition: A | Discharge status: Home / Self Care | Payer: MEDICAID | Attending: Nurse Practitioner | Admitting: Nurse Practitioner

## 2023-12-25 DIAGNOSIS — J3489 Other specified disorders of nose and nasal sinuses: Secondary | ICD-10-CM | POA: Diagnosis not present

## 2023-12-25 HISTORY — DX: Gastro-esophageal reflux disease without esophagitis: K21.9

## 2023-12-25 MED ORDER — AZITHROMYCIN 250 MG PO TABS: 250.0000 mg | ORAL_TABLET | Freq: Every day | ORAL | 0 refills | Status: AC

## 2023-12-25 NOTE — ED Triage Notes (Signed)
 Pt reports for month had sinus pain and pressure. Causing dental and ear pain as well. Tried taking Benadryl , Allegra.

## 2023-12-25 NOTE — ED Provider Notes (Signed)
 [facility]    CSN: [id number] Arrival date & time: 12/25/23  0802      History   Chief Complaint Chief Complaint  Patient presents with   Facial Pain    HPI Mary Hogan is a 43 y.o. female.   HPI  She is in today complaining of a 1 month history of sinus pressure and pain.  She endorses that she is having teeth and ear pain, with nausea.  She endorses that she does smoke.  She does have a history of allergic rhinitis however does not drink regularly.  She has been using over-the-counter antihistamines Zyrtec , Benadryl  ibuprofen  with no relief along with ibuprofen  with no relief.  She denies any dizziness, chest pain, shortness of breath. Past Medical History:  Diagnosis Date   Abnormal Pap smear    Acid reflux    Anxiety    Depression    Enlarged liver    Headache(784.0)    IBS (irritable bowel syndrome)    Scoliosis     Patient Active Problem List   Diagnosis Date Noted   Nausea without vomiting 12/26/2018   Abdominal pain 09/24/2018   Abnormal CT of the abdomen 09/24/2018   Other constipation 09/24/2018   Change in bowel habits 09/24/2018   Anxiety 07/07/2018   Encounter for blood transfusion 07/07/2018   Scoliosis 07/07/2018   Psychotic disorder (HCC) 01/20/2018   Methamphetamine abuse (HCC) 10/27/2017   UTI due to extended-spectrum beta lactamase (ESBL) producing Escherichia coli 06/09/2016   Bipolar affective disorder, currently manic, moderate (HCC) 06/05/2016   URI (upper respiratory infection) 12/23/2013   Attention deficit disorder 06/14/2013   Generalized anxiety disorder 06/14/2013   Essential (primary) hypertension 05/02/2013   SOB (shortness of breath) 05/02/2013   Chest pain 03/08/2012   MVP (mitral valve prolapse) 03/08/2012   Palpitations 03/08/2012   Hx of migraine headaches 02/19/2012   Polysubstance overdose 12/25/2011   Bipolar disorder current episode depressed (HCC) 12/06/2011    Class: Acute   IBS (irritable bowel  syndrome) 10/13/2011   Epigastric pain 08/27/2011   Chronic pain 03/05/2011   Migraine 03/05/2011   TOBACCO USER 05/26/2009   AMENORRHEA, SECONDARY 01/26/2007   DEPRESSIVE DISORDER, NOS 11/05/2006   RESTLESS LEGS SYNDROME 11/05/2006   PAPANICOLAOU SMEAR, ABNORMAL 11/05/2006   PAPANICOLAOU SMEAR, ABNORMAL 11/05/2006    Past Surgical History:  Procedure Laterality Date   BIOPSY  03/14/2019   Procedure: BIOPSY;  Surgeon: Normie Becton., MD;  Location: [facility] ENDOSCOPY;  Service: Gastroenterology;;   BREAST ENHANCEMENT SURGERY  2008   DILATION AND CURETTAGE OF UTERUS     ENDOMETRIAL ABLATION     ESOPHAGOGASTRODUODENOSCOPY (EGD) WITH PROPOFOL  N/A 03/14/2019   Procedure: ESOPHAGOGASTRODUODENOSCOPY (EGD) WITH PROPOFOL ;  Surgeon: Normie Becton., MD;  Location: [facility] ENDOSCOPY;  Service: Gastroenterology;  Laterality: N/A;   EUS N/A 03/14/2019   Procedure: UPPER ENDOSCOPIC ULTRASOUND (EUS) RADIAL;  Surgeon: Normie Becton., MD;  Location: [facility] ENDOSCOPY;  Service: Gastroenterology;  Laterality: N/A;   FINE NEEDLE ASPIRATION  03/14/2019   Procedure: FINE NEEDLE ASPIRATION (FNA) LINEAR;  Surgeon: Brice Campi Albino Alu., MD;  Location: [facility] ENDOSCOPY;  Service: Gastroenterology;;   LAPAROSCOPIC OVARIAN CYSTECTOMY  2012   TUBAL LIGATION      OB History     Gravida  9   Para  4   Term  2   Preterm  2   AB  5   Living  4      SAB  2   IAB  2   Ectopic  1   Multiple      Live Births               Home Medications    Prior to Admission medications   Medication Sig Start Date End Date Taking? Authorizing Provider  azithromycin  (ZITHROMAX ) 250 MG tablet Take 1 tablet (250 mg total) by mouth daily. Take first 2 tablets together, then 1 every day until finished. 12/25/23  Yes Joedy Eickhoff, Marcelene Sep, NP  clonazePAM (KLONOPIN) 1 MG tablet Take 1 mg by mouth 3 (three) times daily as needed. 10/11/21   [provider]  cloNIDine  (CATAPRES ) 0.2 MG tablet Take  0.4 mg by mouth at bedtime. 09/11/21   [provider]  dicyclomine  (BENTYL ) 20 MG tablet TAKE 1 TABLET (20 MG TOTAL) BY MOUTH 2 (TWO) TIMES DAILY AS NEEDED FOR SPASMS. 02/06/23   Graciella Lavender, PA  omeprazole  (PRILOSEC) 40 MG capsule TAKE 1 CAPSULE (40 MG TOTAL) BY MOUTH DAILY. 02/06/23   Edmonia Gottron, PA-C  modafinil (PROVIGIL) 100 MG tablet Take 50-100 mg by mouth See admin instructions. Take 1/2 to 1 tablet every morning and at noon 05/13/18 08/10/19  [provider]    Family History Family History  Problem Relation Age of Onset   Alzheimer's disease Mother    Hypertension Mother    Diabetes Mother    Anxiety disorder Mother    Depression Mother    Post-traumatic stress disorder Mother    Irritable bowel syndrome Mother    Parkinson's disease Mother    Diabetes Father    Liver disease Maternal Grandfather    Pancreatic cancer Maternal Uncle    Stomach cancer Maternal Uncle    Diabetes Maternal Uncle    Colon cancer Maternal Uncle    Esophageal cancer Maternal Uncle    Anesthesia problems Neg Hx    Hypotension Neg Hx    Malignant hyperthermia Neg Hx    Pseudochol deficiency Neg Hx     Social History Social History   Tobacco Use   Smoking status: Every Day    Current packs/day: 1.00    Types: Cigarettes   Smokeless tobacco: Never  Vaping Use   Vaping status: Never Used  Substance Use Topics   Alcohol use: Not Currently    Comment: rare   Drug use: Never     Allergies   Ketorolac  tromethamine , Amoxicillin, Penicillins, and Tramadol   Review of Systems Review of Systems   Physical Exam Triage Vital Signs ED Triage Vitals  Encounter Vitals Group     BP      Systolic BP Percentile      Diastolic BP Percentile      Pulse      Resp      Temp      Temp src      SpO2      Weight      Height      Head Circumference      Peak Flow      Pain Score      Pain Loc      Pain Education      Exclude from Growth Chart    No data  found.  Updated Vital Signs BP 117/82 (BP Location: Right Arm)   Pulse 95   Temp 97.7 F (36.5 C) (Oral)   Resp 16   SpO2 98%   Visual Acuity Right Eye Distance:   Left Eye Distance:   Bilateral Distance:  Right Eye Near:   Left Eye Near:    Bilateral Near:     Physical Exam Constitutional:      General: She is not in acute distress.    Appearance: She is not ill-appearing.  HENT:     Head: Normocephalic and atraumatic.     Right Ear: Tympanic membrane normal.     Left Ear: Tympanic membrane normal.     Nose: No congestion or rhinorrhea.     Mouth/Throat:     Mouth: Mucous membranes are dry.  Eyes:     Pupils: Pupils are equal, round, and reactive to light.  Cardiovascular:     Rate and Rhythm: Normal rate and regular rhythm.     Pulses: Normal pulses.     Heart sounds: Normal heart sounds.  Pulmonary:     Effort: Pulmonary effort is normal.  Musculoskeletal:        General: Normal range of motion.     Cervical back: Normal range of motion.  Skin:    General: Skin is warm.     Capillary Refill: Capillary refill takes less than 2 seconds.  Neurological:     General: No focal deficit present.     Mental Status: She is alert and oriented to person, place, and time.  Psychiatric:        Mood and Affect: Mood normal.      UC Treatments / Results  Labs (all labs ordered are listed, but only abnormal results are displayed) Labs Reviewed - No data to display  EKG   Radiology No results found.  Procedures Procedures (including critical care time)  Medications Ordered in UC Medications - No data to display  Initial Impression / Assessment and Plan / UC Course  I have reviewed the triage vital signs and the nursing notes.  Pertinent labs & imaging results that were available during my care of the patient were reviewed by me and considered in my medical decision making (see chart for details).     Sinus pressure Final Clinical Impressions(s) / UC  Diagnoses   Final diagnoses:  Sinus pressure     Discharge Instructions      Due to your persistent symptoms over the last month you have been prescribed azithromycin  250 mg take as directed.  You are encouraged to take Mucinex D along with the azithromycin  as directed.  You have also been encouraged to start Flonase and continue antihistamine daily.     ED Prescriptions     Medication Sig Dispense Auth. Provider   azithromycin  (ZITHROMAX ) 250 MG tablet Take 1 tablet (250 mg total) by mouth daily. Take first 2 tablets together, then 1 every day until finished. 6 tablet Gregoria Leas, NP      PDMP not reviewed this encounter.   Eleanore Grey [place], [place] 12/25/23 [phone]

## 2023-12-25 NOTE — Discharge Instructions (Addendum)
 Due to your persistent symptoms over the last month you have been prescribed azithromycin  250 mg take as directed.  You are encouraged to take Mucinex D along with the azithromycin  as directed.  You have also been encouraged to start Flonase and continue antihistamine daily.

## 2024-01-15 ENCOUNTER — Other Ambulatory Visit: Payer: Self-pay

## 2024-01-15 ENCOUNTER — Emergency Department (HOSPITAL_BASED_OUTPATIENT_CLINIC_OR_DEPARTMENT_OTHER)
Admission: EM | Admit: 2024-01-15 | Discharge: 2024-01-15 | Discharge status: Against Medical Advice | Payer: MEDICAID | Attending: Emergency Medicine | Admitting: Emergency Medicine

## 2024-01-15 ENCOUNTER — Encounter (HOSPITAL_BASED_OUTPATIENT_CLINIC_OR_DEPARTMENT_OTHER): Payer: Self-pay | Admitting: Emergency Medicine

## 2024-01-15 DIAGNOSIS — J0191 Acute recurrent sinusitis, unspecified: Secondary | ICD-10-CM | POA: Diagnosis present

## 2024-01-15 DIAGNOSIS — Z5321 Procedure and treatment not carried out due to patient leaving prior to being seen by health care provider: Secondary | ICD-10-CM | POA: Insufficient documentation

## 2024-01-15 NOTE — ED Triage Notes (Signed)
 Treated for sinusitis on 12/25/23  Finished Abt  Symptoms returned.  Headache sinus pressure  swollen lymph

## 2024-04-07 ENCOUNTER — Emergency Department (HOSPITAL_COMMUNITY): Payer: MEDICAID

## 2024-04-07 ENCOUNTER — Emergency Department (HOSPITAL_COMMUNITY)
Admission: EM | Admit: 2024-04-07 | Discharge: 2024-04-07 | Disposition: A | Discharge status: Home / Self Care | Payer: MEDICAID

## 2024-04-07 ENCOUNTER — Encounter (HOSPITAL_COMMUNITY): Payer: Self-pay

## 2024-04-07 DIAGNOSIS — F10920 Alcohol use, unspecified with intoxication, uncomplicated: Secondary | ICD-10-CM | POA: Diagnosis not present

## 2024-04-07 DIAGNOSIS — Y905 Blood alcohol level of 100-119 mg/100 ml: Secondary | ICD-10-CM | POA: Insufficient documentation

## 2024-04-07 DIAGNOSIS — T50901A Poisoning by unspecified drugs, medicaments and biological substances, accidental (unintentional), initial encounter: Secondary | ICD-10-CM | POA: Insufficient documentation

## 2024-04-07 DIAGNOSIS — X58XXXA Exposure to other specified factors, initial encounter: Secondary | ICD-10-CM | POA: Diagnosis not present

## 2024-04-07 DIAGNOSIS — R4182 Altered mental status, unspecified: Secondary | ICD-10-CM | POA: Insufficient documentation

## 2024-04-07 LAB — COMPREHENSIVE METABOLIC PANEL WITH GFR
Albumin: 3.8 g/dL (ref 3.5–5.0)
Alkaline Phosphatase: 68 U/L (ref 38–126)
Anion gap: 11 (ref 5–15)
BUN: 11 mg/dL (ref 6–20)
CO2: 23 mmol/L (ref 22–32)
Calcium: 8.7 mg/dL — ABNORMAL LOW (ref 8.9–10.3)
Chloride: 105 mmol/L (ref 98–111)
Creatinine, Ser: 0.96 mg/dL (ref 0.44–1.00)
GFR, Estimated: >60 mL/min (ref >60)
Glucose, Bld: 94 mg/dL (ref 70–99)
Sodium: 139 mmol/L (ref 135–145)
Total Protein: 6.7 g/dL (ref 6.5–8.1)

## 2024-04-07 LAB — CBC WITH DIFFERENTIAL/PLATELET
Abs Immature Granulocytes: 0.02 K/uL (ref 0.00–0.07)
Basophils Absolute: 0.1 K/uL (ref 0.0–0.1)
Basophils Relative: 1 %
Eosinophils Absolute: 0.2 K/uL (ref 0.0–0.5)
Eosinophils Relative: 2 %
HCT: 39.2 % (ref 36.0–46.0)
Hemoglobin: 12.8 g/dL (ref 12.0–15.0)
Immature Granulocytes: 0 %
Lymphocytes Relative: 21 %
Lymphs Abs: 1.8 K/uL (ref 0.7–4.0)
MCH: 29.9 pg (ref 26.0–34.0)
MCHC: 32.7 g/dL (ref 30.0–36.0)
MCV: 91.6 fL (ref 80.0–100.0)
Monocytes Absolute: 0.5 K/uL (ref 0.1–1.0)
Monocytes Relative: 5 %
Neutro Abs: 6.1 K/uL (ref 1.7–7.7)
Neutrophils Relative %: 71 %
Platelets: 220 K/uL (ref 150–400)
RBC: 4.28 MIL/uL (ref 3.87–5.11)
RDW: 13 % (ref 11.5–15.5)
WBC: 8.6 K/uL (ref 4.0–10.5)
nRBC: 0 % (ref 0.0–0.2)

## 2024-04-07 LAB — RAPID URINE DRUG SCREEN, HOSP PERFORMED
Amphetamines: NOT DETECTED
Barbiturates: NOT DETECTED
Benzodiazepines: NOT DETECTED
Cocaine: POSITIVE — AB
Opiates: NOT DETECTED
Tetrahydrocannabinol: NOT DETECTED

## 2024-04-07 LAB — SALICYLATE LEVEL: Salicylate Lvl: <7 mg/dL — ABNORMAL LOW (ref 7.0–30.0)

## 2024-04-07 LAB — ETHANOL: Alcohol, Ethyl (B): 116 mg/dL — ABNORMAL HIGH (ref <15)

## 2024-04-07 LAB — ACETAMINOPHEN LEVEL: Acetaminophen (Tylenol), Serum: <10 ug/mL — ABNORMAL LOW (ref 10–30)

## 2024-04-07 LAB — HCG, SERUM, QUALITATIVE: Preg, Serum: NEGATIVE

## 2024-04-07 MED ORDER — DIPHENHYDRAMINE HCL 50 MG/ML IJ SOLN
25.0000 mg | Freq: Once | INTRAMUSCULAR | Status: AC
  Administered 2024-04-07: 25 mg via INTRAVENOUS
  Filled 2024-04-07: qty 1

## 2024-04-07 MED ORDER — HALOPERIDOL LACTATE 5 MG/ML IJ SOLN
5.0000 mg | Freq: Once | INTRAMUSCULAR | Status: AC
  Administered 2024-04-07: 5 mg via INTRAVENOUS
  Filled 2024-04-07: qty 1

## 2024-04-07 MED ORDER — LACTATED RINGERS IV BOLUS
1000.0000 mL | Freq: Once | INTRAVENOUS | Status: AC
  Administered 2024-04-07: 1000 mL via INTRAVENOUS

## 2024-04-07 MED ORDER — NICOTINE 21 MG/24HR TD PT24: 21.0000 mg | MEDICATED_PATCH | Freq: Once | TRANSDERMAL | Status: DC

## 2024-04-07 NOTE — ED Notes (Signed)
 PT's daughter states that PT has been expressing comments along the lines of no longer wishing to be alive and is concerned that current admission was suicidal in nature, with anniversary of other daughter's Gennaro) death being this past week.

## 2024-04-07 NOTE — ED Provider Notes (Signed)
 [facility] AT [facility] Provider Note   CSN: [id number] Arrival date & time: 04/07/24  1639     Patient presents with: Altered Mental Status   Mary Hogan is a 43 y.o. female.   43 year old female presenting to the emergency department today with concern for ingestion of unknown substance.  Medics initially arrived and the patient was somnolent and hard to arouse with some respiratory depression.  She did receive Narcan and since then she has been acting very agitated.  They reported there was drug paraphernalia at the scene as well as Narcan in the house with the patient.  She was brought to the ER at that time for further evaluation.   Altered Mental Status      Prior to Admission medications   Medication Sig Start Date End Date Taking? Authorizing Provider  azithromycin  (ZITHROMAX ) 250 MG tablet Take 1 tablet (250 mg total) by mouth daily. Take first 2 tablets together, then 1 every day until finished. 12/25/23   Myrna Camelia CHRISTELLA, NP  clonazePAM (KLONOPIN) 1 MG tablet Take 1 mg by mouth 3 (three) times daily as needed. 10/11/21   [provider]  cloNIDine  (CATAPRES ) 0.2 MG tablet Take 0.4 mg by mouth at bedtime. 09/11/21   [provider]  dicyclomine  (BENTYL ) 20 MG tablet TAKE 1 TABLET (20 MG TOTAL) BY MOUTH 2 (TWO) TIMES DAILY AS NEEDED FOR SPASMS. 02/06/23   Beather Delon Gibson, PA  omeprazole  (PRILOSEC) 40 MG capsule TAKE 1 CAPSULE (40 MG TOTAL) BY MOUTH DAILY. 02/06/23   Craig Alan SAUNDERS, PA-C  modafinil (PROVIGIL) 100 MG tablet Take 50-100 mg by mouth See admin instructions. Take 1/2 to 1 tablet every morning and at noon 05/13/18 08/10/19  [provider]    Allergies: Ketorolac  tromethamine , Amoxicillin, Penicillins, and Tramadol    Review of Systems  Reason unable to perform ROS: Altered mental status.    Updated Vital Signs BP (!) 135/96 (BP Location: Right Arm)   Pulse 100   Temp 98.5 F (36.9 C)  (Oral)   Resp 20   Ht 5' 6 (1.676 m)   Wt 77.1 kg   SpO2 97%   BMI 27.44 kg/m   Physical Exam Vitals and nursing note reviewed.   Gen: Flailing around on the bed and is intermittently redirectable but is kicking and mumbling incoherently Eyes: PERRL, EOMI HEENT: no oropharyngeal swelling Neck: trachea midline Resp: clear to auscultation bilaterally Card: RRR, no murmurs, rubs, or gallops Abd: nontender, nondistended Extremities: no calf tenderness, no edema Vascular: 2+ radial pulses bilaterally, 2+ DP pulses bilaterally Neuro: Moving all extremities but will not follow commands, no obvious cranial nerve deficits noted Skin: Bruising and track marks noted to the bilateral upper extremities   (all labs ordered are listed, but only abnormal results are displayed) Labs Reviewed  COMPREHENSIVE METABOLIC PANEL WITH GFR - Abnormal; Notable for the following components:      Result Value   Calcium 8.7 (*)    All other components within normal limits  ETHANOL - Abnormal; Notable for the following components:   Alcohol, Ethyl (B) 116 (*)    All other components within normal limits  RAPID URINE DRUG SCREEN, HOSP PERFORMED - Abnormal; Notable for the following components:   Cocaine POSITIVE (*)    All other components within normal limits  ACETAMINOPHEN  LEVEL - Abnormal; Notable for the following components:   Acetaminophen  (Tylenol ), Serum <10 (*)    All other components within normal limits  SALICYLATE LEVEL - Abnormal; Notable for the following components:   Salicylate Lvl <7.0 (*)    All other components within normal limits  CBC WITH DIFFERENTIAL/PLATELET  HCG, SERUM, QUALITATIVE    EKG: EKG Interpretation Date/Time:  Thursday April 07 2024 17:50:47 EDT Ventricular Rate:  105 PR Interval:  170 QRS Duration:  90 QT Interval:  354 QTC Calculation: 468 R Axis:   71  Text Interpretation: Sinus tachycardia Confirmed by Ula Barter [phone]) on 04/07/2024 5:57:38  PM  Radiology: CT Head Wo Contrast Result Date: 04/07/2024 CLINICAL DATA:  Altered mental status following trauma, initial encounter EXAM: CT HEAD WITHOUT CONTRAST TECHNIQUE: Contiguous axial images were obtained from the base of the skull through the vertex without intravenous contrast. RADIATION DOSE REDUCTION: This exam was performed according to the departmental dose-optimization program which includes automated exposure control, adjustment of the mA and/or kV according to patient size and/or use of iterative reconstruction technique. COMPARISON:  09/14/2003 FINDINGS: Brain: No evidence of acute infarction, hemorrhage, hydrocephalus, extra-axial collection or mass lesion/mass effect. Vascular: No hyperdense vessel or unexpected calcification. Skull: Normal. Negative for fracture or focal lesion. Sinuses/Orbits: No acute finding. Other: None. IMPRESSION: No acute intracranial abnormality noted. Electronically Signed   By: Oneil Devonshire M.D.   On: 04/07/2024 19:51     Procedures   Medications Ordered in the ED  nicotine  (NICODERM CQ  - dosed in mg/24 hours) patch 21 mg (0 mg Transdermal Hold 04/07/24 2208)  haloperidol  lactate (HALDOL ) injection 5 mg (5 mg Intravenous Given 04/07/24 1714)  diphenhydrAMINE  (BENADRYL ) injection 25 mg (25 mg Intravenous Given 04/07/24 1713)  diphenhydrAMINE  (BENADRYL ) injection 25 mg (25 mg Intravenous Given 04/07/24 1744)  lactated ringers  bolus 1,000 mL (0 mLs Intravenous Stopped 04/07/24 1947)                                    Medical Decision Making 43 year old female presenting to the emergency department today with altered mental status with concern for likely ingestion.  I will further evaluate the patient here with basic labs as well as a UDS, EtOH, Tylenol , and salicylate level.  The patient is very sporadic with her movements I am concerned that she will hurt herself or our staff here.  Will give her Haldol  here and place patient in restraints for her  safety.  I will reevaluate for ultimate disposition.  The patient's blood alcohol level is elevated.  Her UDS is positive for cocaine.  With her response to the Narcan suspect that the overdose leading to the patient's somnolence was likely fentanyl.  She remains hemodynamically stable.  Will continue to monitor until clinically sober.  She does not have any signs of any head trauma and there were no reports of any trauma with medics.  The patient was taking a while to get back to normal mental status.  CT scan is ordered to evaluate for intracranial injury.  This is negative.  After further period of observation the patient is back to her baseline.  She continues to deny any suicidal or homicidal ideations.  Denies any visual or auditory hallucinations.  Family was concerned for intent for this but the patient tells me that she was not trying to harm herself and does not meet criteria for IVC now that she is clinically sober.  She is discharged with return precautions.  CRITICAL CARE Performed by: Barter JONELLE Ula   Total critical care time: 39  minutes  Critical care time was exclusive of separately billable procedures and treating other patients.  Critical care was necessary to treat or prevent imminent or life-threatening deterioration.  Critical care was time spent personally by me on the following activities: development of treatment plan with patient and/or surrogate as well as nursing, discussions with consultants, evaluation of patient's response to treatment, examination of patient, obtaining history from patient or surrogate, ordering and performing treatments and interventions, ordering and review of laboratory studies, ordering and review of radiographic studies, pulse oximetry and re-evaluation of patient's condition.   Amount and/or Complexity of Data Reviewed Labs: ordered. Radiology: ordered.  Risk OTC drugs. Prescription drug management.        Final diagnoses:   Accidental overdose, initial encounter  Alcoholic intoxication without complication [facility])    ED Discharge Orders     None          Ula Prentice SAUNDERS, MD 04/07/24 [phone]

## 2024-04-07 NOTE — ED Notes (Signed)
 PO challenged pt and she was able to tolerate. PT also ambulated int he hall with no issues.

## 2024-04-07 NOTE — ED Triage Notes (Signed)
 PT BIB [facility] from home with altered mental status and suspected clonazepam overdose. PT was somnolent until [facility] gave 2mg  Narcan en route, PT became agitated and combative. Alert to self only.   BP 130/82, HR 80's per EMS

## 2024-04-07 NOTE — Discharge Instructions (Signed)
Please follow up with your doctor and return to the ER for worsening symptoms.
